# Patient Record
Sex: Female | Born: 1950 | Race: White | Hispanic: No | Marital: Married | Smoking: Former smoker
Health system: Southern US, Community
[De-identification: ages and names within clinical notes are randomized; demographics above are authoritative.]

## PROBLEM LIST (undated history)

## (undated) DIAGNOSIS — N189 Chronic kidney disease, unspecified: Secondary | ICD-10-CM

## (undated) DIAGNOSIS — F329 Major depressive disorder, single episode, unspecified: Secondary | ICD-10-CM

## (undated) DIAGNOSIS — M1990 Unspecified osteoarthritis, unspecified site: Secondary | ICD-10-CM

## (undated) DIAGNOSIS — M549 Dorsalgia, unspecified: Secondary | ICD-10-CM

## (undated) DIAGNOSIS — R002 Palpitations: Secondary | ICD-10-CM

## (undated) DIAGNOSIS — M810 Age-related osteoporosis without current pathological fracture: Secondary | ICD-10-CM

## (undated) DIAGNOSIS — I071 Rheumatic tricuspid insufficiency: Secondary | ICD-10-CM

## (undated) DIAGNOSIS — F32A Depression, unspecified: Secondary | ICD-10-CM

## (undated) DIAGNOSIS — I340 Nonrheumatic mitral (valve) insufficiency: Secondary | ICD-10-CM

## (undated) DIAGNOSIS — K5909 Other constipation: Secondary | ICD-10-CM

## (undated) DIAGNOSIS — H269 Unspecified cataract: Secondary | ICD-10-CM

## (undated) DIAGNOSIS — T7840XA Allergy, unspecified, initial encounter: Secondary | ICD-10-CM

## (undated) DIAGNOSIS — G8929 Other chronic pain: Secondary | ICD-10-CM

## (undated) DIAGNOSIS — E079 Disorder of thyroid, unspecified: Secondary | ICD-10-CM

## (undated) DIAGNOSIS — E785 Hyperlipidemia, unspecified: Secondary | ICD-10-CM

## (undated) DIAGNOSIS — I10 Essential (primary) hypertension: Secondary | ICD-10-CM

## (undated) DIAGNOSIS — F419 Anxiety disorder, unspecified: Secondary | ICD-10-CM

## (undated) DIAGNOSIS — I2089 Other forms of angina pectoris: Secondary | ICD-10-CM

## (undated) DIAGNOSIS — M797 Fibromyalgia: Secondary | ICD-10-CM

## (undated) DIAGNOSIS — IMO0002 Reserved for concepts with insufficient information to code with codable children: Secondary | ICD-10-CM

## (undated) DIAGNOSIS — K219 Gastro-esophageal reflux disease without esophagitis: Secondary | ICD-10-CM

## (undated) DIAGNOSIS — D649 Anemia, unspecified: Secondary | ICD-10-CM

## (undated) DIAGNOSIS — J449 Chronic obstructive pulmonary disease, unspecified: Secondary | ICD-10-CM

## (undated) DIAGNOSIS — I509 Heart failure, unspecified: Secondary | ICD-10-CM

## (undated) DIAGNOSIS — I208 Other forms of angina pectoris: Secondary | ICD-10-CM

## (undated) DIAGNOSIS — IMO0001 Reserved for inherently not codable concepts without codable children: Secondary | ICD-10-CM

## (undated) HISTORY — PX: TONSILLECTOMY: SUR1361

## (undated) HISTORY — DX: Palpitations: R00.2

## (undated) HISTORY — DX: Other constipation: K59.09

## (undated) HISTORY — DX: Age-related osteoporosis without current pathological fracture: M81.0

## (undated) HISTORY — DX: Gastro-esophageal reflux disease without esophagitis: K21.9

## (undated) HISTORY — DX: Depression, unspecified: F32.A

## (undated) HISTORY — DX: Unspecified osteoarthritis, unspecified site: M19.90

## (undated) HISTORY — DX: Anemia, unspecified: D64.9

## (undated) HISTORY — DX: Other forms of angina pectoris: I20.89

## (undated) HISTORY — DX: Unspecified cataract: H26.9

## (undated) HISTORY — DX: Hyperlipidemia, unspecified: E78.5

## (undated) HISTORY — DX: Disorder of thyroid, unspecified: E07.9

## (undated) HISTORY — DX: Chronic obstructive pulmonary disease, unspecified: J44.9

## (undated) HISTORY — DX: Other chronic pain: G89.29

## (undated) HISTORY — PX: SPINE SURGERY: SHX786

## (undated) HISTORY — DX: Other forms of angina pectoris: I20.8

## (undated) HISTORY — DX: Chronic kidney disease, unspecified: N18.9

## (undated) HISTORY — DX: Dorsalgia, unspecified: M54.9

## (undated) HISTORY — DX: Heart failure, unspecified: I50.9

## (undated) HISTORY — DX: Reserved for concepts with insufficient information to code with codable children: IMO0002

## (undated) HISTORY — DX: Major depressive disorder, single episode, unspecified: F32.9

## (undated) HISTORY — PX: FRACTURE SURGERY: SHX138

## (undated) HISTORY — DX: Nonrheumatic mitral (valve) insufficiency: I34.0

## (undated) HISTORY — PX: EYE SURGERY: SHX253

## (undated) HISTORY — DX: Reserved for inherently not codable concepts without codable children: IMO0001

## (undated) HISTORY — DX: Allergy, unspecified, initial encounter: T78.40XA

## (undated) HISTORY — DX: Essential (primary) hypertension: I10

## (undated) HISTORY — DX: Rheumatic tricuspid insufficiency: I07.1

## (undated) HISTORY — DX: Anxiety disorder, unspecified: F41.9

---

## 1968-08-19 HISTORY — PX: BREAST LUMPECTOMY: SHX2

## 1971-08-20 HISTORY — PX: TUBAL LIGATION: SHX77

## 2000-12-30 ENCOUNTER — Encounter: Payer: Self-pay | Admitting: *Deleted

## 2000-12-30 ENCOUNTER — Ambulatory Visit (HOSPITAL_COMMUNITY): Admission: RE | Admit: 2000-12-30 | Discharge: 2000-12-30 | Payer: Self-pay | Admitting: *Deleted

## 2001-01-26 ENCOUNTER — Other Ambulatory Visit: Admission: RE | Admit: 2001-01-26 | Discharge: 2001-01-26 | Payer: Self-pay | Admitting: *Deleted

## 2002-01-19 ENCOUNTER — Encounter: Payer: Self-pay | Admitting: *Deleted

## 2002-01-19 ENCOUNTER — Ambulatory Visit (HOSPITAL_COMMUNITY): Admission: RE | Admit: 2002-01-19 | Discharge: 2002-01-19 | Payer: Self-pay | Admitting: *Deleted

## 2003-01-26 ENCOUNTER — Encounter: Payer: Self-pay | Admitting: Family Medicine

## 2003-01-26 ENCOUNTER — Ambulatory Visit (HOSPITAL_COMMUNITY): Admission: RE | Admit: 2003-01-26 | Discharge: 2003-01-26 | Payer: Self-pay | Admitting: Family Medicine

## 2003-01-27 ENCOUNTER — Other Ambulatory Visit: Admission: RE | Admit: 2003-01-27 | Discharge: 2003-01-27 | Payer: Self-pay | Admitting: *Deleted

## 2003-07-20 ENCOUNTER — Ambulatory Visit (HOSPITAL_COMMUNITY): Admission: RE | Admit: 2003-07-20 | Discharge: 2003-07-20 | Payer: Self-pay | Admitting: Family Medicine

## 2003-08-02 ENCOUNTER — Encounter (HOSPITAL_COMMUNITY): Admission: RE | Admit: 2003-08-02 | Discharge: 2003-09-01 | Payer: Self-pay | Admitting: Family Medicine

## 2003-08-20 HISTORY — PX: BACK SURGERY: SHX140

## 2003-09-20 ENCOUNTER — Ambulatory Visit (HOSPITAL_COMMUNITY): Admission: RE | Admit: 2003-09-20 | Discharge: 2003-09-20 | Payer: Self-pay | Admitting: Internal Medicine

## 2003-12-12 ENCOUNTER — Ambulatory Visit (HOSPITAL_COMMUNITY): Admission: RE | Admit: 2003-12-12 | Discharge: 2003-12-12 | Payer: Self-pay | Admitting: *Deleted

## 2004-02-28 ENCOUNTER — Encounter (INDEPENDENT_AMBULATORY_CARE_PROVIDER_SITE_OTHER): Payer: Self-pay | Admitting: Specialist

## 2004-02-28 ENCOUNTER — Inpatient Hospital Stay (HOSPITAL_COMMUNITY): Admission: RE | Admit: 2004-02-28 | Discharge: 2004-03-04 | Payer: Self-pay | Admitting: Specialist

## 2005-01-21 ENCOUNTER — Ambulatory Visit (HOSPITAL_COMMUNITY): Admission: RE | Admit: 2005-01-21 | Discharge: 2005-01-21 | Payer: Self-pay | Admitting: *Deleted

## 2005-03-07 ENCOUNTER — Ambulatory Visit (HOSPITAL_COMMUNITY): Admission: RE | Admit: 2005-03-07 | Discharge: 2005-03-07 | Payer: Self-pay | Admitting: Family Medicine

## 2006-04-22 ENCOUNTER — Ambulatory Visit (HOSPITAL_COMMUNITY): Admission: RE | Admit: 2006-04-22 | Discharge: 2006-04-22 | Payer: Self-pay | Admitting: Family Medicine

## 2006-10-17 ENCOUNTER — Ambulatory Visit (HOSPITAL_COMMUNITY): Admission: RE | Admit: 2006-10-17 | Discharge: 2006-10-17 | Payer: Self-pay | Admitting: Family Medicine

## 2007-02-05 ENCOUNTER — Encounter
Admission: RE | Admit: 2007-02-05 | Discharge: 2007-05-06 | Payer: Self-pay | Admitting: Physical Medicine & Rehabilitation

## 2007-02-10 ENCOUNTER — Ambulatory Visit (HOSPITAL_COMMUNITY)
Admission: RE | Admit: 2007-02-10 | Discharge: 2007-02-10 | Payer: Self-pay | Admitting: Physical Medicine & Rehabilitation

## 2007-02-10 ENCOUNTER — Ambulatory Visit: Payer: Self-pay | Admitting: Physical Medicine & Rehabilitation

## 2007-02-13 ENCOUNTER — Ambulatory Visit (HOSPITAL_COMMUNITY): Admission: RE | Admit: 2007-02-13 | Discharge: 2007-02-13 | Payer: Self-pay | Admitting: Nephrology

## 2007-04-07 ENCOUNTER — Ambulatory Visit: Payer: Self-pay | Admitting: Physical Medicine & Rehabilitation

## 2007-05-06 ENCOUNTER — Encounter
Admission: RE | Admit: 2007-05-06 | Discharge: 2007-08-04 | Payer: Self-pay | Admitting: Physical Medicine & Rehabilitation

## 2007-05-22 ENCOUNTER — Ambulatory Visit (HOSPITAL_COMMUNITY): Admission: RE | Admit: 2007-05-22 | Discharge: 2007-05-22 | Payer: Self-pay | Admitting: Internal Medicine

## 2007-06-02 ENCOUNTER — Other Ambulatory Visit: Admission: RE | Admit: 2007-06-02 | Discharge: 2007-06-02 | Payer: Self-pay | Admitting: Obstetrics and Gynecology

## 2007-06-17 ENCOUNTER — Ambulatory Visit: Payer: Self-pay | Admitting: Physical Medicine & Rehabilitation

## 2007-08-07 ENCOUNTER — Encounter
Admission: RE | Admit: 2007-08-07 | Discharge: 2007-11-05 | Payer: Self-pay | Admitting: Physical Medicine & Rehabilitation

## 2007-09-24 ENCOUNTER — Ambulatory Visit: Payer: Self-pay | Admitting: Physical Medicine & Rehabilitation

## 2007-09-30 ENCOUNTER — Encounter
Admission: RE | Admit: 2007-09-30 | Discharge: 2007-10-02 | Payer: Self-pay | Admitting: Physical Medicine & Rehabilitation

## 2007-10-29 ENCOUNTER — Ambulatory Visit: Payer: Self-pay | Admitting: Physical Medicine & Rehabilitation

## 2008-01-01 ENCOUNTER — Encounter
Admission: RE | Admit: 2008-01-01 | Discharge: 2008-03-31 | Payer: Self-pay | Admitting: Physical Medicine & Rehabilitation

## 2008-01-05 ENCOUNTER — Ambulatory Visit: Payer: Self-pay | Admitting: Physical Medicine & Rehabilitation

## 2008-02-04 ENCOUNTER — Ambulatory Visit: Payer: Self-pay | Admitting: Physical Medicine & Rehabilitation

## 2008-03-03 ENCOUNTER — Ambulatory Visit: Payer: Self-pay | Admitting: Physical Medicine & Rehabilitation

## 2008-04-27 ENCOUNTER — Encounter
Admission: RE | Admit: 2008-04-27 | Discharge: 2008-04-28 | Payer: Self-pay | Admitting: Physical Medicine & Rehabilitation

## 2008-04-28 ENCOUNTER — Ambulatory Visit: Payer: Self-pay | Admitting: Physical Medicine & Rehabilitation

## 2008-06-01 ENCOUNTER — Ambulatory Visit (HOSPITAL_COMMUNITY): Admission: RE | Admit: 2008-06-01 | Discharge: 2008-06-01 | Payer: Self-pay | Admitting: Internal Medicine

## 2008-07-26 ENCOUNTER — Other Ambulatory Visit: Admission: RE | Admit: 2008-07-26 | Discharge: 2008-07-26 | Payer: Self-pay | Admitting: Obstetrics and Gynecology

## 2008-09-07 ENCOUNTER — Encounter
Admission: RE | Admit: 2008-09-07 | Discharge: 2008-10-19 | Payer: Self-pay | Admitting: Physical Medicine & Rehabilitation

## 2008-09-08 ENCOUNTER — Ambulatory Visit: Payer: Self-pay | Admitting: Physical Medicine & Rehabilitation

## 2008-10-13 ENCOUNTER — Ambulatory Visit: Payer: Self-pay | Admitting: Physical Medicine & Rehabilitation

## 2008-12-19 HISTORY — PX: US ECHOCARDIOGRAPHY: HXRAD669

## 2009-02-16 HISTORY — PX: NM MYOCAR PERF WALL MOTION: HXRAD629

## 2009-09-26 ENCOUNTER — Encounter: Payer: Self-pay | Admitting: Physician Assistant

## 2009-12-11 ENCOUNTER — Ambulatory Visit: Payer: Self-pay | Admitting: Family Medicine

## 2009-12-11 DIAGNOSIS — F32A Depression, unspecified: Secondary | ICD-10-CM | POA: Insufficient documentation

## 2009-12-11 DIAGNOSIS — E785 Hyperlipidemia, unspecified: Secondary | ICD-10-CM | POA: Insufficient documentation

## 2009-12-11 DIAGNOSIS — M5440 Lumbago with sciatica, unspecified side: Secondary | ICD-10-CM | POA: Insufficient documentation

## 2009-12-11 DIAGNOSIS — N183 Chronic kidney disease, stage 3 unspecified: Secondary | ICD-10-CM | POA: Insufficient documentation

## 2009-12-11 DIAGNOSIS — F329 Major depressive disorder, single episode, unspecified: Secondary | ICD-10-CM

## 2009-12-11 DIAGNOSIS — F419 Anxiety disorder, unspecified: Secondary | ICD-10-CM | POA: Insufficient documentation

## 2009-12-11 DIAGNOSIS — I10 Essential (primary) hypertension: Secondary | ICD-10-CM | POA: Insufficient documentation

## 2009-12-11 DIAGNOSIS — F411 Generalized anxiety disorder: Secondary | ICD-10-CM | POA: Insufficient documentation

## 2009-12-11 DIAGNOSIS — J309 Allergic rhinitis, unspecified: Secondary | ICD-10-CM | POA: Insufficient documentation

## 2009-12-11 DIAGNOSIS — K219 Gastro-esophageal reflux disease without esophagitis: Secondary | ICD-10-CM | POA: Insufficient documentation

## 2009-12-14 ENCOUNTER — Encounter: Payer: Self-pay | Admitting: Physician Assistant

## 2009-12-16 ENCOUNTER — Encounter: Payer: Self-pay | Admitting: Physician Assistant

## 2010-02-14 ENCOUNTER — Encounter: Payer: Self-pay | Admitting: Physician Assistant

## 2010-05-02 ENCOUNTER — Ambulatory Visit: Payer: Self-pay | Admitting: Family Medicine

## 2010-05-02 DIAGNOSIS — R109 Unspecified abdominal pain: Secondary | ICD-10-CM | POA: Insufficient documentation

## 2010-05-02 DIAGNOSIS — K5909 Other constipation: Secondary | ICD-10-CM | POA: Insufficient documentation

## 2010-05-02 DIAGNOSIS — E559 Vitamin D deficiency, unspecified: Secondary | ICD-10-CM | POA: Insufficient documentation

## 2010-05-02 LAB — CONVERTED CEMR LAB
Bilirubin Urine: NEGATIVE
Protein, U semiquant: NEGATIVE
Specific Gravity, Urine: 1.01
WBC Urine, dipstick: NEGATIVE
pH: 5

## 2010-06-07 ENCOUNTER — Encounter: Payer: Self-pay | Admitting: Family Medicine

## 2010-06-19 ENCOUNTER — Ambulatory Visit (HOSPITAL_COMMUNITY): Admission: RE | Admit: 2010-06-19 | Discharge: 2010-06-19 | Payer: Self-pay | Admitting: Family Medicine

## 2010-06-20 ENCOUNTER — Encounter: Payer: Self-pay | Admitting: Physician Assistant

## 2010-06-28 ENCOUNTER — Telehealth (INDEPENDENT_AMBULATORY_CARE_PROVIDER_SITE_OTHER): Payer: Self-pay | Admitting: *Deleted

## 2010-07-11 ENCOUNTER — Encounter: Payer: Self-pay | Admitting: Family Medicine

## 2010-07-26 ENCOUNTER — Encounter: Payer: Self-pay | Admitting: Family Medicine

## 2010-07-26 LAB — CONVERTED CEMR LAB
ALT: 9 U/L
AST: 24 U/L
Albumin: 4.5 g/dL
Alkaline Phosphatase: 37 U/L — ABNORMAL LOW
BUN: 27 mg/dL — ABNORMAL HIGH
CO2: 28 meq/L
Calcium: 9.9 mg/dL
Chloride: 104 meq/L
Cholesterol: 150 mg/dL
Creatinine, Ser: 1.82 mg/dL — ABNORMAL HIGH
Glucose, Bld: 101 mg/dL — ABNORMAL HIGH
HCT: 36.8 %
HDL: 53 mg/dL
Hemoglobin: 12.2 g/dL
LDL Cholesterol: 66 mg/dL
MCHC: 33.2 g/dL
MCV: 93.6 fL
Platelets: 335 K/uL
Potassium: 4.2 meq/L
RBC: 3.93 M/uL
RDW: 14.3 %
Sodium: 141 meq/L
TSH: 8.955 u[IU]/mL — ABNORMAL HIGH
Total Bilirubin: 0.5 mg/dL
Total CHOL/HDL Ratio: 2.8
Total Protein: 6.7 g/dL
Triglycerides: 155 mg/dL — ABNORMAL HIGH
VLDL: 31 mg/dL
Vit D, 25-Hydroxy: 43 ng/mL
WBC: 7.9 10*3/microliter

## 2010-07-31 ENCOUNTER — Encounter: Payer: Self-pay | Admitting: Family Medicine

## 2010-08-01 LAB — CONVERTED CEMR LAB
Free T4: 0.96 ng/dL (ref 0.80–1.80)
T3, Free: 2.2 pg/mL — ABNORMAL LOW (ref 2.3–4.2)

## 2010-08-07 ENCOUNTER — Ambulatory Visit: Payer: Self-pay | Admitting: Family Medicine

## 2010-08-07 DIAGNOSIS — E049 Nontoxic goiter, unspecified: Secondary | ICD-10-CM | POA: Insufficient documentation

## 2010-08-07 DIAGNOSIS — E039 Hypothyroidism, unspecified: Secondary | ICD-10-CM | POA: Insufficient documentation

## 2010-08-07 LAB — CONVERTED CEMR LAB
Hgb A1c MFr Bld: 5.2 % (ref <5.7)
Iron: 75 ug/dL (ref 42–145)

## 2010-08-10 ENCOUNTER — Ambulatory Visit (HOSPITAL_COMMUNITY)
Admission: RE | Admit: 2010-08-10 | Discharge: 2010-08-10 | Payer: Self-pay | Source: Home / Self Care | Attending: Family Medicine | Admitting: Family Medicine

## 2010-08-15 ENCOUNTER — Encounter: Payer: Self-pay | Admitting: Family Medicine

## 2010-08-27 ENCOUNTER — Ambulatory Visit
Admission: RE | Admit: 2010-08-27 | Discharge: 2010-08-27 | Payer: Self-pay | Source: Home / Self Care | Attending: Endocrinology | Admitting: Endocrinology

## 2010-08-31 ENCOUNTER — Other Ambulatory Visit
Admission: RE | Admit: 2010-08-31 | Discharge: 2010-08-31 | Payer: Self-pay | Source: Home / Self Care | Admitting: Obstetrics and Gynecology

## 2010-09-02 ENCOUNTER — Telehealth: Payer: Self-pay | Admitting: Family Medicine

## 2010-09-09 ENCOUNTER — Encounter: Payer: Self-pay | Admitting: Specialist

## 2010-09-09 ENCOUNTER — Encounter: Payer: Self-pay | Admitting: Family Medicine

## 2010-09-10 ENCOUNTER — Encounter: Payer: Self-pay | Admitting: Internal Medicine

## 2010-09-12 ENCOUNTER — Encounter: Payer: Self-pay | Admitting: Family Medicine

## 2010-09-18 NOTE — Assessment & Plan Note (Signed)
Summary: NEW PATIENT- room 1   Vital Signs:  Patient profile:   60 year old female Height:      65 inches Weight:      189.75 pounds BMI:     31.69 O2 Sat:      96 % on Room air Pulse rate:   74 / minute Resp:     16 per minute BP sitting:   124 / 82  (left arm)  Vitals Entered By: Baldomero Lamy LPN (April 25, 624THL [id number] PM) CC: new patient Is Patient Diabetic? No Pain Assessment Patient in pain? yes     Location: back Intensity: 5 Type: sharp Onset of pain  Chronic   CC:  new patient.  History of Present Illness: New pt here to establish care with new PCP. Prev pt of Dr Nevada Crane.  Wants to establish care with a female provider.  Hx of chronic back pain.  Had surgery in 2005, has hardware in her back.  Has another pinched nerve but neurosurg doesnt want to do surgery again.  Had been on Duragesic patch, but having problems getting the name brand patches.  Dr Nevada Crane put her on Morphine in the meantime, but wears off at night & makes her nauseated.  Also has hydrocodone for break thru pain. Has seen Dr Lucillie Garfinkel in the past for injections. Has radiation to LE. States it varies which LE.    Hx of low iron & low vit D.  Pt states she is due to have these checked again.  States her cholesterol labs are UTD though. Last colonoscopy < 10 yrs.  Hx of htn. Taking medications as prescribed. No headache, chest pain or palpitations. Hx of Hyperlipidemia.  Taking medications as prescribed. Not exercising regularly. Hx of GERD.  States is well controlled on meds.   GYN - Dr Glo Herring GI - Dr Daisey Must Cardiologist - Dr Claiborne Billings Nephrology - Dr Lowanda Foster Neuro surg - Dr Louanne Skye   Current Medications (verified): 1)  Toprol Xl 100 Mg Xr24h-Tab (Metoprolol Succinate) .... One Tab By Mouth Once Daily 2)  Lipitor 40 Mg Tabs (Atorvastatin Calcium) .... One Tab By Mouth Once Daily 3)  Torsemide 20 Mg Tabs (Torsemide) .... One To Two Tabs By Mouth Once Daily 4)  Aspir-Low 81 Mg Tbec (Aspirin) .... One  Tab By Mouth Once Daily 5)  Trilipix 135 Mg Cpdr (Choline Fenofibrate) .... One Tab By Mouth Once Daily 6)  Evista 60 Mg Tabs (Raloxifene Hcl) .... One Tab By Mouth Once Daily 7)  Calcium-Vitamin D 600-125 Mg-Unit Tabs (Calcium-Vitamin D) .... One To Two Tabs By Mouth Once Daily 8)  Protonix 40 Mg Tbec (Pantoprazole Sodium) .... One To Two Tabs By Mouth Once Daily 9)  Flexeril 5 Mg Tabs (Cyclobenzaprine Hcl) .... One Tab By Mouth Three Times A Day As Needed 10)  Lorazepam 1 Mg Tabs (Lorazepam) .... One Tab Po Daily As Needed and One Tab By Mouth At Bedtime 11)  Cymbalta 60 Mg Cpep (Duloxetine Hcl) .... One Cap By Mouth Once Daily 12)  Vitamin D 1000 Unit Tabs (Cholecalciferol) .... Two Tabs By Mouth Once Daily 13)  Morphine Sulfate Cr 60 Mg Xr12h-Tab (Morphine Sulfate) .... One Tab By Mouth Three Times A Day 14)  Hydrocodone-Acetaminophen 7.5-325 Mg Tabs (Hydrocodone-Acetaminophen) .... One Tab By Mouth Two Times A Day As Needed For Break Through Pain 15)  Senokot S 8.6-50 Mg Tabs (Sennosides-Docusate Sodium) .... One Tab By Mouth At Bedtime 16)  Stool Softener 100 Mg Caps (Docusate Sodium) .Marland KitchenMarland KitchenMarland Kitchen  One Cap By Mouth Two Times A Day 17)  Qc Daily Multivitamins/iron  Tabs (Multiple Vitamins-Iron) .... One Tab By Mouth Once Daily 18)  Miralax  Powd (Polyethylene Glycol 3350) .Marland Kitchen.. 17grams With Water By Mouth At Bedtime As Needed 19)  Lovaza 1 Gm Caps (Omega-3-Acid Ethyl Esters) .... Two Caps By Mouth Two Times A Day With Meals 20)  Fexofenadine Hcl 180 Mg Tabs (Fexofenadine Hcl) .... One Tab By Mouth Once Daily 21)  Fluticasone Propionate 50 Mcg/act Susp (Fluticasone Propionate) .... Two Puffs Each Nostril Daily  Allergies (verified): 1)  ! Duragesic-100 (Fentanyl)  Past History:  Past medical, surgical, family and social histories (including risk factors) reviewed for relevance to current acute and chronic problems.  Past Medical History: Allergic  rhinitis Anxiety Depression GERD Hypertension Chronic back pain Chronic constipation Palptations CKD Angina  GYN - Dr Glo Herring GI - Dr Daisey Must Cardiologist - Dr Claiborne Billings Nephrology - Dr Lowanda Foster Neuro surg - Dr Louanne Skye  Past Surgical History: Lumpectomy right breast 1970 Tubal ligation (1973) Back surgery 2005 Tonsillectomy childhood  Family History: Reviewed history and no changes required. mother deceased- osteoporosis, rheumatoid arthritis father deceased- heart disease, DM, HTN, hyperlipidemia, bladder cancer two brothers living- arthritis, heart mumor  Social History: Reviewed history and no changes required. Disabled Married 42 years Two grown children Quit smoking 2005 Alcohol use-no Drug use-no Regular exercise-no Drug Use:  no Does Patient Exercise:  no  Review of Systems CV:  Complains of chest pain or discomfort and palpitations; SEES CARDIOLOGIST. Resp:  Complains of shortness of breath; denies cough and wheezing; WITH ANXIETY & EXERTIONAL. GI:  Complains of constipation. MS:  Complains of low back pain. Neuro:  Complains of numbness and tingling. Psych:  Complains of anxiety, depression, and easily tearful.  Physical Exam  General:  Well-developed,well-nourished,in no acute distress; alert,appropriate and cooperative throughout examination Head:  Normocephalic and atraumatic without obvious abnormalities. No apparent alopecia or balding. Ears:  External ear exam shows no significant lesions or deformities.  Otoscopic examination reveals clear canals, tympanic membranes are intact bilaterally without bulging, retraction, inflammation or discharge. Hearing is grossly normal bilaterally. Nose:  External nasal examination shows no deformity or inflammation. Nasal mucosa are pink and moist without lesions or exudates. Mouth:  Oral mucosa and oropharynx without lesions or exudates.   Neck:  no thyromegaly.   Lungs:  Normal respiratory effort, chest expands  symmetrically. Lungs are clear to auscultation, no crackles or wheezes. Heart:  Normal rate and regular rhythm. S1 and S2 normal without gallop, murmur, click, rub or other extra sounds. Abdomen:  Bowel sounds positive,abdomen soft and non-tender without masses, organomegaly or hernias noted. Cervical Nodes:  No lymphadenopathy noted Psych:  Cognition and judgment appear intact. Alert and cooperative with normal attention span and concentration. No apparent delusions, illusions, hallucinations   Impression & Recommendations:  Problem # 1:  LOW BACK PAIN, CHRONIC (ICD-724.2) Assessment Deteriorated Discussed with pt that I will not manage her pain medications.  She will need to return to the pain clinic for this. I will not prescribe or refill any of these medications now or in the future.  Her updated medication list for this problem includes:    Aspir-low 81 Mg Tbec (Aspirin) ..... One tab by mouth once daily    Flexeril 5 Mg Tabs (Cyclobenzaprine hcl) ..... One tab by mouth three times a day as needed    Morphine Sulfate Cr 60 Mg Xr12h-tab (Morphine sulfate) ..... One tab by mouth three times a day  Hydrocodone-acetaminophen 7.5-325 Mg Tabs (Hydrocodone-acetaminophen) ..... One tab by mouth two times a day as needed for break through pain  Orders: Misc. Referral (Misc. Ref)  Problem # 2:  HYPERTENSION (ICD-401.9) Assessment: Comment Only  Her updated medication list for this problem includes:    Toprol Xl 100 Mg Xr24h-tab (Metoprolol succinate) ..... One tab by mouth once daily    Torsemide 20 Mg Tabs (Torsemide) ..... One to two tabs by mouth once daily  BP today: 124/82  Problem # 3:  RENAL INSUFFICIENCY, CHRONIC (ICD-585.9) Assessment: Comment Only Pt to continue follow up care with nephrologist.  Problem # 4:  GERD (ICD-530.81) Assessment: Comment Only  Her updated medication list for this problem includes:    Protonix 40 Mg Tbec (Pantoprazole sodium) ..... One to two  tabs by mouth once daily  Problem # 5:  HYPERLIPIDEMIA (ICD-272.4) Assessment: Comment Only  Her updated medication list for this problem includes:    Lipitor 40 Mg Tabs (Atorvastatin calcium) ..... One tab by mouth once daily    Trilipix 135 Mg Cpdr (Choline fenofibrate) ..... One tab by mouth once daily    Lovaza 1 Gm Caps (Omega-3-acid ethyl esters) .Marland Kitchen..Marland Kitchen Two caps by mouth two times a day with meals  Complete Medication List: 1)  Toprol Xl 100 Mg Xr24h-tab (Metoprolol succinate) .... One tab by mouth once daily 2)  Lipitor 40 Mg Tabs (Atorvastatin calcium) .... One tab by mouth once daily 3)  Torsemide 20 Mg Tabs (Torsemide) .... One to two tabs by mouth once daily 4)  Aspir-low 81 Mg Tbec (Aspirin) .... One tab by mouth once daily 5)  Trilipix 135 Mg Cpdr (Choline fenofibrate) .... One tab by mouth once daily 6)  Evista 60 Mg Tabs (Raloxifene hcl) .... One tab by mouth once daily 7)  Calcium-vitamin D 600-125 Mg-unit Tabs (Calcium-vitamin d) .... One to two tabs by mouth once daily 8)  Protonix 40 Mg Tbec (Pantoprazole sodium) .... One to two tabs by mouth once daily 9)  Flexeril 5 Mg Tabs (Cyclobenzaprine hcl) .... One tab by mouth three times a day as needed 10)  Lorazepam 1 Mg Tabs (Lorazepam) .... One tab po daily as needed and one tab by mouth at bedtime 11)  Cymbalta 60 Mg Cpep (Duloxetine hcl) .... One cap by mouth once daily 12)  Vitamin D 1000 Unit Tabs (Cholecalciferol) .... Two tabs by mouth once daily 13)  Morphine Sulfate Cr 60 Mg Xr12h-tab (Morphine sulfate) .... One tab by mouth three times a day 14)  Hydrocodone-acetaminophen 7.5-325 Mg Tabs (Hydrocodone-acetaminophen) .... One tab by mouth two times a day as needed for break through pain 15)  Senokot S 8.6-50 Mg Tabs (Sennosides-docusate sodium) .... One tab by mouth at bedtime 16)  Stool Softener 100 Mg Caps (Docusate sodium) .... One cap by mouth two times a day 17)  Qc Daily Multivitamins/iron Tabs (Multiple  vitamins-iron) .... One tab by mouth once daily 18)  Miralax Powd (Polyethylene glycol 3350) .Marland Kitchen.. 17grams with water by mouth at bedtime as needed 19)  Lovaza 1 Gm Caps (Omega-3-acid ethyl esters) .... Two caps by mouth two times a day with meals 20)  Fexofenadine Hcl 180 Mg Tabs (Fexofenadine hcl) .... One tab by mouth once daily 21)  Fluticasone Propionate 50 Mcg/act Susp (Fluticasone propionate) .... Two puffs each nostril daily  Patient Instructions: 1)  Please schedule a follow-up appointment in 3 months. 2)  We will refer you back to Dr Nelva Bush for your back pain management.  3)  We will send your record request to Dr Nevada Crane.

## 2010-09-18 NOTE — Letter (Signed)
Summary: Letter to [facility] appt.  Letter to [facility] appt.   Imported By: Eliezer Mccoy 06/08/2010 15:18:38  _____________________________________________________________________  External Attachment:    Type:   Image     Comment:   External Document

## 2010-09-18 NOTE — Progress Notes (Signed)
Summary: [facility]   Imported By: Dierdre Harness 03/01/2010 16:24:01  _____________________________________________________________________  External Attachment:    Type:   Image     Comment:   External Document

## 2010-09-18 NOTE — Progress Notes (Signed)
Summary: [facility]  [facility]   Imported By: Dierdre Harness 12/28/2009 10:29:59  _____________________________________________________________________  External Attachment:    Type:   Image     Comment:   External Document

## 2010-09-18 NOTE — Assessment & Plan Note (Signed)
Summary: follow up - room 3   Vital Signs:  Patient profile:   60 year old female Height:      65 inches Weight:      186.75 pounds BMI:     31.19 O2 Sat:      96 % on Room air Pulse rate:   86 / minute Resp:     16 per minute BP sitting:   140 / 70  (left arm)  Vitals Entered By: Baldomero Lamy LPN (September 14, 624THL 4:10 PM) CC: follow-up visit Is Patient Diabetic? No   CC:  follow-up visit.  History of Present Illness: Pt presents today for check up.  Having lower abd pain x -2  mos, intermittently.  Notices with pushing on the area and change of positions. Hx of chronic constipation.  Uses stool softeners daily.  Has been going regularly. sometimes has this abd pain with BMs.  No blood.  She has scheduled an appt with Dr Glo Herring for her exam.  Hx of htn. Taking medications as prescribed and denies side effects.  No headache, chest pain.  Has palpitations only with anxiety.  Hx of Hyperlipidemia.  Taking medications as prescribed and no side effects.  Following a low fat diet. Not exercising regularly.  Hx of anxiety.  Increased family stress.  Difficulty falling asleep, stays asleep OK.  No SI or HI. Hx of allergies.  Allegra not working well.  Requests prescription allergy med.  Also is using a steroid nasal spray daily.        Current Medications (verified): 1)  Toprol Xl 100 Mg Xr24h-Tab (Metoprolol Succinate) .... One Tab By Mouth Once Daily 2)  Lipitor 40 Mg Tabs (Atorvastatin Calcium) .... One Tab By Mouth Once Daily 3)  Torsemide 20 Mg Tabs (Torsemide) .... One To Two Tabs By Mouth Once Daily 4)  Aspir-Low 81 Mg Tbec (Aspirin) .... One Tab By Mouth Once Daily 5)  Trilipix 135 Mg Cpdr (Choline Fenofibrate) .... One Tab By Mouth Once Daily 6)  Evista 60 Mg Tabs (Raloxifene Hcl) .... One Tab By Mouth Once Daily 7)  Calcium-Vitamin D 600-125 Mg-Unit Tabs (Calcium-Vitamin D) .... One To Two Tabs By Mouth Once Daily 8)  Protonix 40 Mg Tbec (Pantoprazole Sodium)  .... One To Two Tabs By Mouth Once Daily 9)  Flexeril 5 Mg Tabs (Cyclobenzaprine Hcl) .... One Tab By Mouth Three Times A Day As Needed 10)  Lorazepam 1 Mg Tabs (Lorazepam) .... One Tab Po Daily As Needed and One Tab By Mouth At Bedtime 11)  Cymbalta 60 Mg Cpep (Duloxetine Hcl) .... One Cap By Mouth Once Daily 12)  Vitamin D 1000 Unit Tabs (Cholecalciferol) .... Two Tabs By Mouth Once Daily 13)  Morphine Sulfate Cr 60 Mg Xr12h-Tab (Morphine Sulfate) .... One Tab By Mouth Three Times A Day 14)  Hydrocodone-Acetaminophen 7.5-325 Mg Tabs (Hydrocodone-Acetaminophen) .... One Tab By Mouth Two Times A Day As Needed For Break Through Pain 15)  Senokot S 8.6-50 Mg Tabs (Sennosides-Docusate Sodium) .... One Tab By Mouth At Bedtime 16)  Stool Softener 100 Mg Caps (Docusate Sodium) .... One Cap By Mouth Two Times A Day 17)  Qc Daily Multivitamins/iron  Tabs (Multiple Vitamins-Iron) .... One Tab By Mouth Once Daily 18)  Miralax  Powd (Polyethylene Glycol 3350) .Marland Kitchen.. 17grams With Water By Mouth At Bedtime As Needed 19)  Lovaza 1 Gm Caps (Omega-3-Acid Ethyl Esters) .... Two Caps By Mouth Two Times A Day With Meals 20)  Fexofenadine Hcl 180 Mg  Tabs (Fexofenadine Hcl) .... One Tab By Mouth Once Daily 21)  Fluticasone Propionate 50 Mcg/act Susp (Fluticasone Propionate) .... Two Puffs Each Nostril Daily  Allergies (verified): 1)  ! Duragesic-100 (Fentanyl)  Past History:  Past medical history reviewed for relevance to current acute and chronic problems.  Past Medical History: Reviewed history from 12/11/2009 and no changes required. Allergic rhinitis Anxiety Depression GERD Hypertension Chronic back pain Chronic constipation Palptations CKD Angina  GYN - Dr Glo Herring GI - Dr Daisey Must Cardiologist - Dr Claiborne Billings Nephrology - Dr Lowanda Foster Neuro surg - Dr Louanne Skye  Review of Systems General:  Denies chills and fever. ENT:  Complains of nasal congestion; denies ear discharge, postnasal drainage, sinus  pressure, and sore throat. CV:  Denies chest pain or discomfort and palpitations. Resp:  Denies shortness of breath. GI:  Complains of abdominal pain and constipation; denies bloody stools, change in bowel habits, nausea, and vomiting. GU:  Denies dysuria, incontinence, and urinary frequency.  Physical Exam  General:  Well-developed,well-nourished,in no acute distress; alert,appropriate and cooperative throughout examination Head:  Normocephalic and atraumatic without obvious abnormalities. No apparent alopecia or balding. Ears:  External ear exam shows no significant lesions or deformities.  Otoscopic examination reveals clear canals, tympanic membranes are intact bilaterally without bulging, retraction, inflammation or discharge. Hearing is grossly normal bilaterally. Nose:  External nasal examination shows no deformity or inflammation. Nasal mucosa are pink and moist without lesions or exudates. Mouth:  Oral mucosa and oropharynx without lesions or exudates.  Teeth in good repair. Neck:  No deformities, masses, or tenderness noted. Lungs:  Normal respiratory effort, chest expands symmetrically. Lungs are clear to auscultation, no crackles or wheezes. Heart:  Normal rate and regular rhythm. S1 and S2 normal without gallop, murmur, click, rub or other extra sounds. Abdomen:  soft, normal bowel sounds, no masses, no hepatomegaly, and no splenomegaly.  Mild suprapubic TTP midline. Cervical Nodes:  No lymphadenopathy noted Psych:  Cognition and judgment appear intact. Alert and cooperative with normal attention span and concentration. No apparent delusions, illusions, hallucinations   Impression & Recommendations:  Problem # 1:  ABDOMINAL PAIN (ICD-789.00) Assessment New Discussed with pt may be due to her chronic constipation, &/or her LBP.  Orders: KUB (KUB) Urinalysis [id number])  Problem # 2:  HYPERTENSION (ICD-401.9) Assessment: Comment Only  Her updated medication list for  this problem includes:    Toprol Xl 100 Mg Xr24h-tab (Metoprolol succinate) ..... One tab by mouth once daily    Torsemide 20 Mg Tabs (Torsemide) ..... One to two tabs by mouth once daily  Orders: T-Comprehensive Metabolic Panel ([id number])  BP today: 140/70 Prior BP: 124/82 (12/11/2009)  Problem # 3:  HYPERLIPIDEMIA (ICD-272.4) Assessment: Comment Only  Her updated medication list for this problem includes:    Lipitor 40 Mg Tabs (Atorvastatin calcium) ..... One tab by mouth once daily    Trilipix 135 Mg Cpdr (Choline fenofibrate) ..... One tab by mouth once daily    Lovaza 1 Gm Caps (Omega-3-acid ethyl esters) .Marland Kitchen..Marland Kitchen Two caps by mouth two times a day with meals  Orders: T-Comprehensive Metabolic Panel ([id number]) T-Lipid Profile [id number])  Problem # 4:  ALLERGIC RHINITIS (ICD-477.9) Assessment: Deteriorated  Her updated medication list for this problem includes:    Xyzal 5 Mg Tabs (Levocetirizine dihydrochloride) .Marland Kitchen... Take 1 daily    Fluticasone Propionate 50 Mcg/act Susp (Fluticasone propionate) .Marland Kitchen..Marland Kitchen Two puffs each nostril daily  Discussed use of allergy medications and environmental measures.   Complete Medication List: 1)  Toprol Xl  100 Mg Xr24h-tab (Metoprolol succinate) .... One tab by mouth once daily 2)  Lipitor 40 Mg Tabs (Atorvastatin calcium) .... One tab by mouth once daily 3)  Torsemide 20 Mg Tabs (Torsemide) .... One to two tabs by mouth once daily 4)  Aspir-low 81 Mg Tbec (Aspirin) .... One tab by mouth once daily 5)  Trilipix 135 Mg Cpdr (Choline fenofibrate) .... One tab by mouth once daily 6)  Evista 60 Mg Tabs (Raloxifene hcl) .... One tab by mouth once daily 7)  Calcium-vitamin D 600-125 Mg-unit Tabs (Calcium-vitamin d) .... One to two tabs by mouth once daily 8)  Protonix 40 Mg Tbec (Pantoprazole sodium) .... One to two tabs by mouth once daily 9)  Flexeril 5 Mg Tabs (Cyclobenzaprine hcl) .... One tab by mouth three times a day as needed 10)   Lorazepam 1 Mg Tabs (Lorazepam) .... One tab po daily as needed and one tab by mouth at bedtime 11)  Cymbalta 60 Mg Cpep (Duloxetine hcl) .... One cap by mouth once daily 12)  Vitamin D 1000 Unit Tabs (Cholecalciferol) .... Two tabs by mouth once daily 13)  Morphine Sulfate Cr 60 Mg Xr12h-tab (Morphine sulfate) .... One tab by mouth three times a day 14)  Hydrocodone-acetaminophen 7.5-325 Mg Tabs (Hydrocodone-acetaminophen) .... One tab by mouth two times a day as needed for break through pain 15)  Senokot S 8.6-50 Mg Tabs (Sennosides-docusate sodium) .... One tab by mouth at bedtime 16)  Stool Softener 100 Mg Caps (Docusate sodium) .... One cap by mouth two times a day 17)  Qc Daily Multivitamins/iron Tabs (Multiple vitamins-iron) .... One tab by mouth once daily 18)  Miralax Powd (Polyethylene glycol 3350) .Marland Kitchen.. 17grams with water by mouth at bedtime as needed 19)  Lovaza 1 Gm Caps (Omega-3-acid ethyl esters) .... Two caps by mouth two times a day with meals 20)  Xyzal 5 Mg Tabs (Levocetirizine dihydrochloride) .... Take 1 daily 21)  Fluticasone Propionate 50 Mcg/act Susp (Fluticasone propionate) .... Two puffs each nostril daily 22)  Neurontin 800 Mg Tabs (Gabapentin) .... Take 1 two times a day  Other Orders: Influenza Vaccine MCR [id number]) T-CBC No Diff [id number]) T-TSH [id number]) T-Vitamin D (25-Hydroxy) [id number])  Patient Instructions: 1)  Please schedule a follow-up appointment in 3 months. 2)  I have ordered an xray of your abdomen. 3)  I have refilled your medications and changed your allergy pill as we discussed. 4)  Have blood work drawn fasting. Prescriptions: XYZAL 5 MG TABS (LEVOCETIRIZINE DIHYDROCHLORIDE) take 1 daily  #30 x 1   Entered and Authorized by:   Kennith Gain PA   Signed by:   Kennith Gain PA on 05/02/2010   Method used:   Electronically to        [facility] (retail)       [address]       [place], [place]   [address]       Ph: [phone]       Fax: [phone]   RxID:   [id number] NEURONTIN 800 MG TABS (GABAPENTIN) take 1 two times a day  #180 x 3   Entered and Authorized by:   Kennith Gain PA   Signed by:   Kennith Gain PA on 05/02/2010   Method used:   Print then Give to Patient   RxID:   [id number] LORAZEPAM 1 MG TABS (LORAZEPAM) one tab po daily as needed and one tab by mouth  at bedtime  #180 x 1   Entered and Authorized by:   Kennith Gain PA   Signed by:   Kennith Gain PA on 05/02/2010   Method used:   Print then Give to Patient   RxID:   [id number] CYMBALTA 60 MG CPEP (DULOXETINE HCL) one cap by mouth once daily  #90 x 3   Entered and Authorized by:   Kennith Gain PA   Signed by:   Kennith Gain PA on 05/02/2010   Method used:   Print then Give to Patient   RxID:   [id number] EVISTA 60 MG TABS (RALOXIFENE HCL) one tab by mouth once daily  #90 x 3   Entered and Authorized by:   Kennith Gain PA   Signed by:   Kennith Gain PA on 05/02/2010   Method used:   Print then Give to Patient   RxID:   [id number]    Influenza Vaccine    Vaccine Type: Fluvax MCR    Site: right deltoid    Mfr: novartis    Dose: 0.5 ml    Route: IM    Given by: Baldomero Lamy LPN    Exp. Date: 12/2010    Lot #: [id number]    VIS given: 03/13/10 version given May 02, 2010.   Laboratory Results   Urine Tests  Date/Time Received: May 02, 2010 5:14 PM  Date/Time Reported: May 02, 2010 5:14 PM   Routine Urinalysis   Color: yellow Appearance: Clear Glucose: negative   (Normal Range: Negative) Bilirubin: negative   (Normal Range: Negative) Ketone: negative   (Normal Range: Negative) Spec. Gravity: 1.010   (Normal Range: 1.003-1.035) Blood: negative   (Normal Range: Negative) pH: 5.0   (Normal Range: 5.0-8.0) Protein: negative   (Normal Range: Negative) Urobilinogen: 0.2   (Normal Range: 0-1) Nitrite: negative   (Normal Range: Negative) Leukocyte Esterace:  negative   (Normal Range: Negative)

## 2010-09-18 NOTE — Letter (Signed)
Summary: MEDICAL RELEASE  MEDICAL RELEASE   Imported By: Dierdre Harness 12/14/2009 14:18:44  _____________________________________________________________________  External Attachment:    Type:   Image     Comment:   External Document

## 2010-09-18 NOTE — Progress Notes (Signed)
Summary: evista  Phone Note Call from Patient   Complaint: Cough/Sore throat Summary of Call: patient needs her evista 60 mg faxed to [facility] ([facility]) she request the 3 months supply with 3 refills.  there number is [phone] (fax)  Initial call taken by: Eliezer Mccoy,  June 28, 2010 4:49 PM  Follow-up for Phone Call        pls advise 54month supply will be sent in , she needs an appt withme before the additional 3 refills ,can sched in the next 6 to 10 weeks, i have entered it historically , pls fax after speaking with her Follow-up by: Tula Nakayama MD,  June 28, 2010 5:17 PM  Additional Follow-up for Phone Call Additional follow up Details #1::        Called patient, left message Additional Follow-up by: Louie Casa CMA,  June 29, 2010 10:03 AM    Additional Follow-up for Phone Call Additional follow up Details #2::    Called patient, no answer Follow-up by: Louie Casa CMA,  June 29, 2010 2:57 PM  Additional Follow-up for Phone Call Additional follow up Details #3:: Details for Additional Follow-up Action Taken: patient returned call advised of the information above, she has an appt. scheduled for Dec. 22, 2011. Additional Follow-up by: Eliezer Mccoy,  June 29, 2010 4:51 PM  New/Updated Medications: EVISTA 60 MG TABS (RALOXIFENE HCL) Take 1 tablet by mouth once a day Prescriptions: EVISTA 60 MG TABS (RALOXIFENE HCL) Take 1 tablet by mouth once a day  #90 x 0   Entered and Authorized by:   Tula Nakayama MD   Signed by:   Tula Nakayama MD on 06/28/2010   Method used:   Historical   [id number]

## 2010-09-18 NOTE — Letter (Signed)
Summary: Letter  Letter   Imported By: Dierdre Harness 07/26/2010 16:44:38  _____________________________________________________________________  External Attachment:    Type:   Image     Comment:   External Document

## 2010-09-18 NOTE — Letter (Signed)
Summary: [place]  [facility]  [place]  [facility]   Imported By: Dierdre Harness 07/11/2010 10:48:05  _____________________________________________________________________  External Attachment:    Type:   Image     Comment:   External Document

## 2010-09-20 NOTE — Assessment & Plan Note (Signed)
Summary: office visit   Vital Signs:  Patient profile:   60 year old female Height:      65 inches Weight:      197.50 pounds BMI:     32.98 O2 Sat:      97 % on Room air Pulse rate:   61 / minute Pulse rhythm:   regular Resp:     16 per minute BP sitting:   120 / 70  (left arm)  Vitals Entered By: Baldomero Lamy LPN (December 20, 624THL 11:31 AM)  Nutrition Counseling: Patient's BMI is greater than 25 and therefore counseled on weight management options.  O2 Flow:  Room air CC: follow-up visit lab review Is Patient Diabetic? No   Primary Care Provider:  Tula Nakayama MD  CC:  follow-up visit lab review.  History of Present Illness: Reports  that she has not been doing very well. She reports excessive hair loss, coarse voice , cold intolerance , weight gain, and states her mother had throid disease and feels this is a problem in her as well Denies recent fever or chills. Denies sinus pressure, nasal congestion , ear pain or sore throat. Denies chest congestion, or cough productive of sputum. Denies chest pain, palpitations, PND, orthopnea or leg swelling. Denies abdominal pain, nausea, vomitting, diarrhea or constipation. Denies change in bowel movements or bloody stool. Denies dysuria , frequency, incontinence or hesitancy. Denies  joint pain, swelling, or reduced mobility. Denies headaches, vertigo, seizures. Denies depression, anxiety or insomnia. Denies  rash, lesions, or itch.     Current Medications (verified): 1)  Toprol Xl 100 Mg Xr24h-Tab (Metoprolol Succinate) .... One Tab By Mouth Once Daily 2)  Lipitor 40 Mg Tabs (Atorvastatin Calcium) .... One Tab By Mouth Once Daily 3)  Torsemide 20 Mg Tabs (Torsemide) .... One To Two Tabs By Mouth Once Daily 4)  Aspir-Low 81 Mg Tbec (Aspirin) .... One Tab By Mouth Once Daily 5)  Trilipix 135 Mg Cpdr (Choline Fenofibrate) .... One Tab By Mouth Once Daily 6)  Calcium-Vitamin D 600-125 Mg-Unit Tabs (Calcium-Vitamin D)  .... One To Two Tabs By Mouth Once Daily 7)  Protonix 40 Mg Tbec (Pantoprazole Sodium) .... One To Two Tabs By Mouth Once Daily 8)  Flexeril 5 Mg Tabs (Cyclobenzaprine Hcl) .... As Needed 9)  Lorazepam 1 Mg Tabs (Lorazepam) .... One Tab Po Daily As Needed and One Tab By Mouth At Bedtime 10)  Cymbalta 60 Mg Cpep (Duloxetine Hcl) .... One Cap By Mouth Once Daily 11)  Vitamin D 1000 Unit Tabs (Cholecalciferol) .... Two Tabs By Mouth Once Daily 12)  Hydrocodone-Acetaminophen 7.5-325 Mg Tabs (Hydrocodone-Acetaminophen) .... One Tab By Mouth Two Times A Day As Needed For Break Through Pain 13)  Senokot S 8.6-50 Mg Tabs (Sennosides-Docusate Sodium) .... One Tab By Mouth At Bedtime 14)  Stool Softener 100 Mg Caps (Docusate Sodium) .... One Cap By Mouth Two Times A Day 15)  Qc Daily Multivitamins/iron  Tabs (Multiple Vitamins-Iron) .... One Tab By Mouth Once Daily 16)  Miralax  Powd (Polyethylene Glycol 3350) .Marland Kitchen.. 17grams With Water By Mouth At Bedtime As Needed 17)  Lovaza 1 Gm Caps (Omega-3-Acid Ethyl Esters) .... Two Caps By Mouth Two Times A Day With Meals 18)  Xyzal 5 Mg Tabs (Levocetirizine Dihydrochloride) .... Take 1 Daily 19)  Fluticasone Propionate 50 Mcg/act Susp (Fluticasone Propionate) .... Two Puffs Each Nostril Daily 20)  Neurontin 800 Mg Tabs (Gabapentin) .... Take 1 Two Times A Day 21)  Evista 60 Mg  Tabs (Raloxifene Hcl) .... Take 1 Tablet By Mouth Once A Day 22)  Fentanyl 50 Mcg/hr Pt72 (Fentanyl) .... Change Patch Every 2 Days  Allergies (verified): 1)  ! Duragesic-100 (Fentanyl)  Review of Systems      See HPI General:  Complains of fatigue. Eyes:  Denies blurring, discharge, double vision, and eye irritation. Psych:  Complains of anxiety, depression, and mental problems; denies suicidal thoughts/plans, thoughts of violence, and unusual visions or sounds; controlled on meds. Endo:  Complains of cold intolerance and weight change; denies excessive hunger, excessive thirst,  excessive urination, and heat intolerance. Heme:  Denies abnormal bruising, bleeding, enlarge lymph nodes, and fevers. Allergy:  Complains of seasonal allergies; denies hives or rash, itching eyes, and persistent infections.  Physical Exam  General:  Well-developed,well-nourished,in no acute distress; alert,appropriate and cooperative throughout examination HEENT: No facial asymmetry, goiter EOMI, No sinus tenderness, TM's Clear, oropharynx  pink and moist.   Chest: Clear to auscultation bilaterally.  CVS: S1, S2, No murmurs, No S3.   Abd: Soft, Nontender.  MS: Adequate ROM spine, hips, shoulders and knees.  Ext: No edema.   CNS: CN 2-12 intact, power tone and sensation normal throughout.   Skin: Intact, no visible lesions or rashes.  Psych: Good eye contact, normal affect.  Memory intact, not anxious or depressed appearing.    Impression & Recommendations:  Problem # 1:  HYPERLIPIDEMIA (B2193296.4) Assessment Comment Only  Her updated medication list for this problem includes:    Lipitor 40 Mg Tabs (Atorvastatin calcium) ..... One tab by mouth once daily    Trilipix 135 Mg Cpdr (Choline fenofibrate) ..... One tab by mouth once daily    Lovaza 1 Gm Caps (Omega-3-acid ethyl esters) .Marland Kitchen..Marland Kitchen Two caps by mouth two times a day with meals  Labs Reviewed: SGOT: 24 (06/20/2010)   SGPT: 9 (06/20/2010)   HDL:53 (06/20/2010)  LDL:66 (06/20/2010)  Chol:150 (06/20/2010)  Trig:155 (06/20/2010)  Problem # 2:  UNSPECIFIED HYPOTHYROIDISM (ICD-244.9) Assessment: Comment Only  Labs Reviewed: TSH: 8.955 (06/20/2010)    Chol: 150 (06/20/2010)   HDL: 53 (06/20/2010)   LDL: 66 (06/20/2010)   TG: 155 (06/20/2010) will order thyroid US for further eval  Problem # 3:  HYPERTENSION (ICD-401.9) Assessment: Improved  Her updated medication list for this problem includes:    Toprol Xl 100 Mg Xr24h-tab (Metoprolol succinate) ..... One tab by mouth once daily    Torsemide 20 Mg Tabs (Torsemide) ..... One  to two tabs by mouth once daily  BP today: 120/70 Prior BP: 140/70 (05/02/2010)  Labs Reviewed: K+: 4.2 (06/20/2010) Creat: : 1.82 (06/20/2010)   Chol: 150 (06/20/2010)   HDL: 53 (06/20/2010)   LDL: 66 (06/20/2010)   TG: 155 (06/20/2010)  Problem # 4:  DEPRESSION (ICD-311) Assessment: Unchanged  Her updated medication list for this problem includes:    Lorazepam 1 Mg Tabs (Lorazepam) ..... One tab po daily as needed and one tab by mouth at bedtime    Cymbalta 60 Mg Cpep (Duloxetine hcl) ..... One cap by mouth once daily  Problem # 5:  ANXIETY (ICD-300.00) Assessment: Improved  Her updated medication list for this problem includes:    Lorazepam 1 Mg Tabs (Lorazepam) ..... One tab po daily as needed and one tab by mouth at bedtime    Cymbalta 60 Mg Cpep (Duloxetine hcl) ..... One cap by mouth once daily  Problem # 6:  ALLERGIC RHINITIS (ICD-477.9) Assessment: Unchanged  Her updated medication list for this problem includes:  Xyzal 5 Mg Tabs (Levocetirizine dihydrochloride) .Marland Kitchen... Take 1 daily    Fluticasone Propionate 50 Mcg/act Susp (Fluticasone propionate) .Marland Kitchen..Marland Kitchen Two puffs each nostril daily  Complete Medication List: 1)  Toprol Xl 100 Mg Xr24h-tab (Metoprolol succinate) .... One tab by mouth once daily 2)  Lipitor 40 Mg Tabs (Atorvastatin calcium) .... One tab by mouth once daily 3)  Torsemide 20 Mg Tabs (Torsemide) .... One to two tabs by mouth once daily 4)  Aspir-low 81 Mg Tbec (Aspirin) .... One tab by mouth once daily 5)  Trilipix 135 Mg Cpdr (Choline fenofibrate) .... One tab by mouth once daily 6)  Calcium-vitamin D 600-125 Mg-unit Tabs (Calcium-vitamin d) .... One to two tabs by mouth once daily 7)  Protonix 40 Mg Tbec (Pantoprazole sodium) .... One to two tabs by mouth once daily 8)  Flexeril 5 Mg Tabs (Cyclobenzaprine hcl) .... As needed 9)  Lorazepam 1 Mg Tabs (Lorazepam) .... One tab po daily as needed and one tab by mouth at bedtime 10)  Cymbalta 60 Mg Cpep  (Duloxetine hcl) .... One cap by mouth once daily 11)  Vitamin D 1000 Unit Tabs (Cholecalciferol) .... Two tabs by mouth once daily 12)  Hydrocodone-acetaminophen 7.5-325 Mg Tabs (Hydrocodone-acetaminophen) .... One tab by mouth two times a day as needed for break through pain 13)  Senokot S 8.6-50 Mg Tabs (Sennosides-docusate sodium) .... One tab by mouth at bedtime 14)  Stool Softener 100 Mg Caps (Docusate sodium) .... One cap by mouth two times a day 15)  Qc Daily Multivitamins/iron Tabs (Multiple vitamins-iron) .... One tab by mouth once daily 16)  Miralax Powd (Polyethylene glycol 3350) .Marland Kitchen.. 17grams with water by mouth at bedtime as needed 17)  Lovaza 1 Gm Caps (Omega-3-acid ethyl esters) .... Two caps by mouth two times a day with meals 18)  Xyzal 5 Mg Tabs (Levocetirizine dihydrochloride) .... Take 1 daily 19)  Fluticasone Propionate 50 Mcg/act Susp (Fluticasone propionate) .... Two puffs each nostril daily 20)  Neurontin 800 Mg Tabs (Gabapentin) .... Take 1 two times a day 21)  Evista 60 Mg Tabs (Raloxifene hcl) .... Take 1 tablet by mouth once a day 22)  Fentanyl 50 Mcg/hr Pt72 (Fentanyl) .... Change patch every 2 days  Other Orders: T- Hemoglobin A1C [id number]) T-Ferritin [id number]) Alric Quan [id number]) Radiology Referral (Radiology) Tdap => 65yrs IM [id number]) Admin 1st Vaccine [id number])  Patient Instructions: 1)  Please schedule a follow-up appointment in 2 months. 2)  HbgA1C prior to visit, ICD-9: 3)  Iron and ferritin   today 4)  It is important that you exercise regularly at least 30 minutes 5 times a week. If you develop chest pain, have severe difficulty breathing, or feel very tired , stop exercising immediately and seek medical attention. 5)  You need to lose weight. Consider a lower calorie diet and regular exercise.  6)  You need an [place] of thyroid gland as soon as possible so you can be started on thyroid treatment   Orders Added: 1)  Est. Patient Level IV  [99214] 2)  T- Hemoglobin A1C [83036-23375] 3)  T-Ferritin [[id number]] 4)  T-Iron [id number] 5)  Radiology Referral [Radiology] 6)  Tdap => 50yrs IM [id number] 7)  Admin 1st Vaccine [id number]   Immunizations Administered:  Tetanus Vaccine:    Vaccine Type: Tdap    Site: right deltoid    Mfr: GlaxoSmithKline    Dose: 0.5 ml    Route: IM    Given by: Baldomero Lamy LPN    Exp.  Date: 06/07/2012    Lot #: [id number]    VIS given: 07/06/08 version given August 07, 2010.   Immunizations Administered:  Tetanus Vaccine:    Vaccine Type: Tdap    Site: right deltoid    Mfr: GlaxoSmithKline    Dose: 0.5 ml    Route: IM    Given by: Baldomero Lamy LPN    Exp. Date: 06/07/2012    Lot #: [id number]    VIS given: 07/06/08 version given August 07, 2010.

## 2010-09-20 NOTE — Medication Information (Signed)
Summary: [employer] [facility]   Imported By: Dierdre Harness 08/07/2010 14:15:33  _____________________________________________________________________  External Attachment:    Type:   Image     Comment:   External Document

## 2010-09-20 NOTE — Progress Notes (Signed)
  Phone Note Other Incoming   Caller: dr Welton Bord Summary of Call: pls order TSH on [facility] in mid to end Feb evaluate hypothyroidism, pls let her know to collect theorder/that it's faxed in and put a copy to Dr Loanne Drilling , thanks Initial call taken by: Tula Nakayama MD,  September 02, 2010 12:39 PM  Follow-up for Phone Call        lab ordered, called patient, left message Follow-up by: Kate Sable LPN,  January 16, [id number] 1:53 PM  Additional Follow-up for Phone Call Additional follow up Details #1::        Patient aware, faxed to the lab  Additional Follow-up by: Kate Sable LPN,  January 16, [id number] 4:24 PM

## 2010-09-20 NOTE — Assessment & Plan Note (Signed)
Summary: NEW ENDO CON/ THYROID/ NOTES IN EMR/MEDICARE/ BCBS/ NWS  #   Vital Signs:  Patient profile:   60 year old female Height:      65 inches (165.10 cm) Weight:      190 pounds (86.36 kg) BMI:     31.73 O2 Sat:      94 % on Room air Temp:     98.4 degrees F (36.89 degrees C) oral Pulse rate:   64 / minute Pulse rhythm:   regular BP sitting:   112 / 72  (left arm) Cuff size:   regular  Vitals Entered By: Rebeca Alert CMA Deborra Medina) (August 27, 2010 9:13 AM)  O2 Flow:  Room air CC: New Endo Consult/Thyroid/Dr. Simpson/aj Is Patient Diabetic? No   Referring Provider:  Tula Nakayama MD Primary Provider:  Tula Nakayama MD  CC:  New Endo Consult/Thyroid/Dr. Simpson/aj.  History of Present Illness: pt was noted on routine blood test to have an elevated tsh.  ultrasound was abnormal.   symptomatically, pt states slight hoarseness sensation in the neck, and associated fatigue.    Current Medications (verified): 1)  Toprol Xl 100 Mg Xr24h-Tab (Metoprolol Succinate) .... One Tab By Mouth Once Daily 2)  Lipitor 40 Mg Tabs (Atorvastatin Calcium) .... One Tab By Mouth Once Daily 3)  Torsemide 20 Mg Tabs (Torsemide) .... One To Two Tabs By Mouth Once Daily 4)  Aspir-Low 81 Mg Tbec (Aspirin) .... One Tab By Mouth Once Daily 5)  Trilipix 135 Mg Cpdr (Choline Fenofibrate) .... One Tab By Mouth Once Daily 6)  Calcium-Vitamin D 600-125 Mg-Unit Tabs (Calcium-Vitamin D) .... One To Two Tabs By Mouth Once Daily 7)  Protonix 40 Mg Tbec (Pantoprazole Sodium) .... One To Two Tabs By Mouth Once Daily 8)  Flexeril 5 Mg Tabs (Cyclobenzaprine Hcl) .... As Needed 9)  Lorazepam 1 Mg Tabs (Lorazepam) .... One Tab Po Daily As Needed and One Tab By Mouth At Bedtime 10)  Cymbalta 60 Mg Cpep (Duloxetine Hcl) .... One Cap By Mouth Once Daily 11)  Vitamin D 1000 Unit Tabs (Cholecalciferol) .... Two Tabs By Mouth Once Daily 12)  Hydrocodone-Acetaminophen 7.5-325 Mg Tabs (Hydrocodone-Acetaminophen) ....  One Tab By Mouth Two Times A Day As Needed For Break Through Pain 13)  Senokot S 8.6-50 Mg Tabs (Sennosides-Docusate Sodium) .... One Tab By Mouth At Bedtime 14)  Stool Softener 100 Mg Caps (Docusate Sodium) .... One Cap By Mouth Two Times A Day 15)  Qc Daily Multivitamins/iron  Tabs (Multiple Vitamins-Iron) .... One Tab By Mouth Once Daily 16)  Miralax  Powd (Polyethylene Glycol 3350) .Marland Kitchen.. 17grams With Water By Mouth At Bedtime As Needed 17)  Lovaza 1 Gm Caps (Omega-3-Acid Ethyl Esters) .... Two Caps By Mouth Two Times A Day With Meals 18)  Xyzal 5 Mg Tabs (Levocetirizine Dihydrochloride) .... Take 1 Daily 19)  Fluticasone Propionate 50 Mcg/act Susp (Fluticasone Propionate) .... Two Puffs Each Nostril Daily 20)  Neurontin 800 Mg Tabs (Gabapentin) .... Take 1 Two Times A Day 21)  Evista 60 Mg Tabs (Raloxifene Hcl) .... Take 1 Tablet By Mouth Once A Day 22)  Fentanyl 50 Mcg/hr Pt72 (Fentanyl) .... Change Patch Every 2 Days  Allergies (verified): 1)  ! Duragesic-100 (Fentanyl)  Past History:  Past Medical History: Last updated: 12/11/2009 Allergic rhinitis Anxiety Depression GERD Hypertension Chronic back pain Chronic constipation Palptations CKD Angina  GYN - Dr Glo Herring GI - Dr Daisey Must Cardiologist - Dr Claiborne Billings Nephrology - Dr Lowanda Foster Neuro surg -  Dr Louanne Skye  Family History: Reviewed history from 12/11/2009 and no changes required. mother deceased- osteoporosis, rheumatoid arthritis father deceased- heart disease, DM, HTN, hyperlipidemia, bladder cancer two brothers living- arthritis, heart mumor goiter: grandmother  Social History: Reviewed history from 12/11/2009 and no changes required. Disabled Married 42 years Two grown children Quit smoking 2005 Alcohol use-no Drug use-no Regular exercise-no  Review of Systems       The patient complains of weight gain and depression.         denies menopausal sxs, galactorrrhea, fever, dysuria, headache, rash, blurry  vision, chest pain.  she has chronic polyuria, leg weakness, easy bruising, doe, rhinorrhea, and numbness of the left leg.    Physical Exam  General:  normal appearance.   Head:  head: no deformity eyes: no periorbital swelling, no proptosis external nose and ears are normal mouth: no lesion seen Neck:  the thyroid is slightly enlarged, right > left.  i do not appreciate a nodule.   Lungs:  Clear to auscultation bilaterally. Normal respiratory effort.  Heart:  Regular rate and rhythm without murmurs or gallops noted. Normal S1,S2.   Msk:  muscle bulk and strength are grossly normal.  no obvious joint swelling.  gait is normal and steady  Extremities:  no deformity trace right pedal edema and trace left pedal edema.   Neurologic:  cn 2-12 grossly intact.   readily moves all 4's.   sensation is intact to touch on all 4's.   Skin:  normal texture and temp.  no rash.  not diaphoretic  Cervical Nodes:  No significant adenopathy.  Psych:  Alert and cooperative; normal mood and affect; normal attention span and concentration.   Additional Exam:  outside test results are reviewed:  tsh=8 THYROID ULTRASOUND 08/10/2010: Borderline enlarged thyroid gland with heterogeneous echotexture diffusely.  No dominant nodules; solitary approximate 6 mm solid nodule in the midportion of the right lobe near the isthmus. 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 1 tab once daily  Other Orders: Est. Patient Level V [id number]  Patient Instructions: 1)  start levothyroxine 50 micrograms/day.  i have sent to [facility]. 2)   recheck tsh in 4-6 weeks 244.9.  i think dr Moshe Cipro wold be glad to do this at her office. i would be happy to review.  goal would be normal level. 3)  Please schedule a follow-up appointment in 1 year. Prescriptions: LEVOTHYROXINE SODIUM 50 MCG TABS (LEVOTHYROXINE SODIUM) 1 tab once daily  #90 x 3   Entered and Authorized by:   Donavan Foil MD   Signed by:   Donavan Foil MD on 08/27/2010   Method used:   Electronically to        [facility] (mail-order)       [address] [place]  [address]       Ph: [phone]       Fax: [phone]   RxID:   [id number]    Orders Added: 1)  Est. Patient Level V [id number]   Immunization History:  Pneumovax Immunization History:  Pneumovax:  historical (08/19/1998)   Immunization History:  Pneumovax Immunization History:    Pneumovax:  Historical (08/19/1998)   Preventive Care Screening  Colonoscopy:    Date:  08/19/2002    Results:  normal

## 2010-09-20 NOTE — Miscellaneous (Signed)
  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

## 2010-09-26 NOTE — Letter (Signed)
[facility]   Imported By: Dierdre Harness 09/18/2010 13:09:09  _____________________________________________________________________  External Attachment:    Type:   Image     Comment:   External Document

## 2010-10-03 ENCOUNTER — Encounter: Payer: Self-pay | Admitting: Family Medicine

## 2010-10-09 ENCOUNTER — Encounter: Payer: Self-pay | Admitting: Family Medicine

## 2010-10-09 ENCOUNTER — Ambulatory Visit (INDEPENDENT_AMBULATORY_CARE_PROVIDER_SITE_OTHER): Payer: Medicare Other | Admitting: Family Medicine

## 2010-10-09 DIAGNOSIS — F411 Generalized anxiety disorder: Secondary | ICD-10-CM

## 2010-10-09 DIAGNOSIS — E785 Hyperlipidemia, unspecified: Secondary | ICD-10-CM

## 2010-10-09 DIAGNOSIS — I10 Essential (primary) hypertension: Secondary | ICD-10-CM

## 2010-10-09 DIAGNOSIS — E039 Hypothyroidism, unspecified: Secondary | ICD-10-CM

## 2010-10-09 DIAGNOSIS — J309 Allergic rhinitis, unspecified: Secondary | ICD-10-CM

## 2010-10-17 DIAGNOSIS — R5381 Other malaise: Secondary | ICD-10-CM | POA: Insufficient documentation

## 2010-10-17 DIAGNOSIS — R5383 Other fatigue: Secondary | ICD-10-CM

## 2010-10-25 ENCOUNTER — Encounter: Payer: Self-pay | Admitting: Family Medicine

## 2010-10-25 NOTE — Assessment & Plan Note (Signed)
Summary: F UP 2 MOTHS   Vital Signs:  Patient profile:   60 year old female Height:      65 inches Weight:      198 pounds BMI:     33.07 O2 Sat:      96 % Pulse rate:   71 / minute Pulse rhythm:   regular Resp:     16 per minute BP sitting:   124 / 74  (right arm)  Vitals Entered By: Kate Sable LPN (February 21, [id number] 1:19 PM)  Nutrition Counseling: Patient's BMI is greater than 25 and therefore counseled on weight management options. CC: Follow up chronic problems   Primary Care Provider:  Tula Nakayama MD  CC:  Follow up chronic problems.  History of Present Illness: Reports  that  she is fairly well. She did see endocrine since her visit, and has a 1 year return, she is on replacement, and no biopsy was indicated. Denies recent fever or chills. Denies sinus pressure, nasal congestion , ear pain or sore throat. Denies chest congestion, or cough productive of sputum. Denies chest pain, palpitations, PND, orthopnea or leg swelling. Denies abdominal pain, nausea, vomitting, diarrhea or constipation. Denies change in bowel movements or bloody stool. Denies dysuria , frequency, incontinence or hesitancy. . Denies headaches, vertigo, seizures. Symptoms of depression and anxiety have worsened. Denies  rash, lesions, or itch.     Current Medications (verified): 1)  Toprol Xl 100 Mg Xr24h-Tab (Metoprolol Succinate) .... One Tab By Mouth Once Daily 2)  Lipitor 40 Mg Tabs (Atorvastatin Calcium) .... One Tab By Mouth Once Daily 3)  Torsemide 20 Mg Tabs (Torsemide) .... One To Two Tabs By Mouth Once Daily 4)  Aspir-Low 81 Mg Tbec (Aspirin) .... One Tab By Mouth Once Daily 5)  Trilipix 135 Mg Cpdr (Choline Fenofibrate) .... One Tab By Mouth Once Daily 6)  Calcium-Vitamin D 600-125 Mg-Unit Tabs (Calcium-Vitamin D) .... One To Two Tabs By Mouth Once Daily 7)  Protonix 40 Mg Tbec (Pantoprazole Sodium) .... One To Two Tabs By Mouth Once Daily 8)  Flexeril 5 Mg Tabs  (Cyclobenzaprine Hcl) .... As Needed 9)  Lorazepam 1 Mg Tabs (Lorazepam) .... One Tab Po Daily As Needed and One Tab By Mouth At Bedtime 10)  Cymbalta 60 Mg Cpep (Duloxetine Hcl) .... One Cap By Mouth Once Daily 11)  Vitamin D 1000 Unit Tabs (Cholecalciferol) .... Two Tabs By Mouth Once Daily 12)  Hydrocodone-Acetaminophen 7.5-325 Mg Tabs (Hydrocodone-Acetaminophen) .... One Tab By Mouth Two Times A Day As Needed For Break Through Pain 13)  Senokot S 8.6-50 Mg Tabs (Sennosides-Docusate Sodium) .... One Tab By Mouth At Bedtime 14)  Stool Softener 100 Mg Caps (Docusate Sodium) .... One Cap By Mouth Two Times A Day 15)  Qc Daily Multivitamins/iron  Tabs (Multiple Vitamins-Iron) .... One Tab By Mouth Once Daily 16)  Miralax  Powd (Polyethylene Glycol 3350) .Marland Kitchen.. 17grams With Water By Mouth At Bedtime As Needed 17)  Lovaza 1 Gm Caps (Omega-3-Acid Ethyl Esters) .... Two Caps By Mouth Two Times A Day With Meals 18)  Xyzal 5 Mg Tabs (Levocetirizine Dihydrochloride) .... Take 1 Daily 19)  Fluticasone Propionate 50 Mcg/act Susp (Fluticasone Propionate) .... Two Puffs Each Nostril Daily 20)  Neurontin 800 Mg Tabs (Gabapentin) .... Take 1 Two Times A Day 21)  Evista 60 Mg Tabs (Raloxifene Hcl) .... Take 1 Tablet By Mouth Once A Day 22)  Fentanyl 50 Mcg/hr Pt72 (Fentanyl) .... Change Patch Every 2 Days 23)  Levothyroxine Sodium 50 Mcg Tabs (Levothyroxine Sodium) .Marland Kitchen.. 1 Tab Once Daily  Allergies (verified): 1)  ! Duragesic-100 (Fentanyl)  Review of Systems      See HPI General:  Complains of fatigue. Eyes:  Complains of vision loss-both eyes; denies discharge and eye pain. MS:  Complains of low back pain and mid back pain; chronic. Psych:  Complains of anxiety, depression, and mental problems; denies suicidal thoughts/plans, thoughts of violence, and unusual visions or sounds. Endo:  Denies cold intolerance, excessive hunger, excessive thirst, and excessive urination. Heme:  Denies abnormal bruising,  bleeding, enlarge lymph nodes, and fevers. Allergy:  Complains of seasonal allergies; increased allergy symptoms in the past 1 month.  Physical Exam  General:  Well-developed,well-nourished,in no acute distress; alert,appropriate and cooperative throughout examination HEENT: No facial asymmetry, goiter EOMI, No sinus tenderness, TM's Clear, oropharynx  pink and moist.   Chest: Clear to auscultation bilaterally.  CVS: S1, S2, No murmurs, No S3.   Abd: Soft, Nontender.  MS: decreased  ROM spine,adequate in  hips, shoulders and knees.  Ext: No edema.   CNS: CN 2-12 intact, power tone and sensation normal throughout.   Skin: Intact, no visible lesions or rashes.  Psych: Good eye contact, normal affect.  Memory intact, anxious and depressed appearing.    Impression & Recommendations:  Problem # 1:  ALLERGIC RHINITIS CAUSE UNSPECIFIED (ICD-477.9) Assessment Deteriorated  Her updated medication list for this problem includes:    Xyzal 5 Mg Tabs (Levocetirizine dihydrochloride) .Marland Kitchen... Take 1 daily    Flonase 50 Mcg/act Susp (Fluticasone propionate) .Marland Kitchen... 2 puffs each nostril daily  Orders: Medicare Electronic Prescription [phone])  Problem # 2:  UNSPECIFIED HYPOTHYROIDISM (ICD-244.9) Assessment: Comment Only  Her updated medication list for this problem includes:    Levothyroxine Sodium 50 Mcg Tabs (Levothyroxine sodium) .Marland Kitchen... 1 tab once daily  Orders: T-TSH [id number])  Labs Reviewed: TSH: 8.955 (06/20/2010)    HgBA1c: 5.2 (08/07/2010) Chol: 150 (06/20/2010)   HDL: 53 (06/20/2010)   LDL: 66 (06/20/2010)   TG: 155 (06/20/2010)  Problem # 3:  HYPERLIPIDEMIA (ICD-272.4) Assessment: Comment Only  Her updated medication list for this problem includes:    Lipitor 40 Mg Tabs (Atorvastatin calcium) ..... One tab by mouth once daily    Trilipix 135 Mg Cpdr (Choline fenofibrate) ..... One tab by mouth once daily    Lovaza 1 Gm Caps (Omega-3-acid ethyl esters) .Marland Kitchen..Marland Kitchen Two caps by mouth  two times a day with meals will need rept lipids before next visit Labs Reviewed: SGOT: 24 (06/20/2010)   SGPT: 9 (06/20/2010)   HDL:53 (06/20/2010)  LDL:66 (06/20/2010)  Chol:150 (06/20/2010)  Trig:155 (06/20/2010)  Problem # 4:  HYPERTENSION (ICD-401.9) Assessment: Unchanged  Her updated medication list for this problem includes:    Toprol Xl 100 Mg Xr24h-tab (Metoprolol succinate) ..... One tab by mouth once daily    Torsemide 20 Mg Tabs (Torsemide) ..... One to two tabs by mouth once daily  BP today: 124/74 Prior BP: 112/72 (08/27/2010)  Labs Reviewed: K+: 4.2 (06/20/2010) Creat: : 1.82 (06/20/2010)   Chol: 150 (06/20/2010)   HDL: 53 (06/20/2010)   LDL: 66 (06/20/2010)   TG: 155 (06/20/2010)  Problem # 5:  DEPRESSION (ICD-311) Assessment: Deteriorated  Her updated medication list for this problem includes:    Lorazepam 1 Mg Tabs (Lorazepam) ..... One tab po daily as needed and one tab by mouth at bedtime    Cymbalta 60 Mg Cpep (Duloxetine hcl) ..... One cap by mouth once daily  Discussed treatment options, including trial of antidpressant medication. Will refer to behavioral health. Follow-up call in in 24-48 hours and recheck in 2 weeks, sooner as needed. Patient agrees to call if any worsening of symptoms or thoughts of doing harm arise. Verified that the patient has no suicidal ideation at this time.   Problem # 6:  ANXIETY (ICD-300.00) Assessment: Deteriorated  Her updated medication list for this problem includes:    Lorazepam 1 Mg Tabs (Lorazepam) ..... One tab po daily as needed and one tab by mouth at bedtime    Cymbalta 60 Mg Cpep (Duloxetine hcl) ..... One cap by mouth once daily  Discussed medication use and relaxation techniques.   Problem # 7:  LOW BACK PAIN, CHRONIC (ICD-724.2) Assessment: Unchanged  Her updated medication list for this problem includes:    Aspir-low 81 Mg Tbec (Aspirin) ..... One tab by mouth once daily    Flexeril 5 Mg Tabs  (Cyclobenzaprine hcl) .Marland Kitchen... As needed    Hydrocodone-acetaminophen 7.5-325 Mg Tabs (Hydrocodone-acetaminophen) ..... One tab by mouth two times a day as needed for break through pain    Fentanyl 50 Mcg/hr Pt72 (Fentanyl) .Marland Kitchen... Change patch every 2 days  Complete Medication List: 1)  Toprol Xl 100 Mg Xr24h-tab (Metoprolol succinate) .... One tab by mouth once daily 2)  Lipitor 40 Mg Tabs (Atorvastatin calcium) .... One tab by mouth once daily 3)  Torsemide 20 Mg Tabs (Torsemide) .... One to two tabs by mouth once daily 4)  Aspir-low 81 Mg Tbec (Aspirin) .... One tab by mouth once daily 5)  Trilipix 135 Mg Cpdr (Choline fenofibrate) .... One tab by mouth once daily 6)  Calcium-vitamin D 600-125 Mg-unit Tabs (Calcium-vitamin d) .... One to two tabs by mouth once daily 7)  Protonix 40 Mg Tbec (Pantoprazole sodium) .... One to two tabs by mouth once daily 8)  Flexeril 5 Mg Tabs (Cyclobenzaprine hcl) .... As needed 9)  Lorazepam 1 Mg Tabs (Lorazepam) .... One tab po daily as needed and one tab by mouth at bedtime 10)  Cymbalta 60 Mg Cpep (Duloxetine hcl) .... One cap by mouth once daily 11)  Vitamin D 1000 Unit Tabs (Cholecalciferol) .... Two tabs by mouth once daily 12)  Hydrocodone-acetaminophen 7.5-325 Mg Tabs (Hydrocodone-acetaminophen) .... One tab by mouth two times a day as needed for break through pain 13)  Senokot S 8.6-50 Mg Tabs (Sennosides-docusate sodium) .... One tab by mouth at bedtime 14)  Stool Softener 100 Mg Caps (Docusate sodium) .... One cap by mouth two times a day 15)  Qc Daily Multivitamins/iron Tabs (Multiple vitamins-iron) .... One tab by mouth once daily 16)  Miralax Powd (Polyethylene glycol 3350) .Marland Kitchen.. 17grams with water by mouth at bedtime as needed 17)  Lovaza 1 Gm Caps (Omega-3-acid ethyl esters) .... Two caps by mouth two times a day with meals 18)  Xyzal 5 Mg Tabs (Levocetirizine dihydrochloride) .... Take 1 daily 19)  Flonase 50 Mcg/act Susp (Fluticasone propionate)  .... 2 puffs each nostril daily 20)  Neurontin 800 Mg Tabs (Gabapentin) .... Take 1 two times a day 21)  Evista 60 Mg Tabs (Raloxifene hcl) .... Take 1 tablet by mouth once a day 22)  Fentanyl 50 Mcg/hr Pt72 (Fentanyl) .... Change patch every 2 days 23)  Levothyroxine Sodium 50 Mcg Tabs (Levothyroxine sodium) .Marland Kitchen.. 1 tab once daily  Patient Instructions: 1)  Please schedule a follow-up appointment in 4 months. 2)  It is important that you exercise regularly at least 20 minutes  5 times a week. If you develop chest pain, have severe difficulty breathing, or feel very tired , stop exercising immediately and seek medical attention. 3)  You need to lose weight. Consider a lower calorie diet and regular exercise.  4)  TSH prior to visit, ICD-9:  in 4 months 5)  Pls consider seeing a therapist.  Prescriptions: FLONASE 50 MCG/ACT SUSP (FLUTICASONE PROPIONATE) 2 puffs each nostril daily  #60months x 0   Entered by:   Kate Sable LPN   Authorized by:   Tula Nakayama MD   Signed by:   Kate Sable LPN on [id number]   Method used:   Electronically to        [facility] (mail-order)       [place]  [address]       Ph: [phone]       Fax: [phone]   RxID:   [id number]    Orders Added: 1)  Est. Patient Level IV [id number] 2)  Medicare Electronic Prescription [id number] 3)  T-TSH [id number]  Appended Document: F UP 2 MOTHS pls advise pt she will need to fast before her next labs, I had only originally requested a tsh, but she also has chem 7 , lipid and hepatic, pls fax a new order to the lab and notify the lab also, this is as well as the TSH  Appended Document: F UP 2 MOTHS called patient left message  Appended Document: F UP 2 MOTHS called patient left message  Appended Document: Orders Update    Clinical Lists Changes  Problems: Added new problem of FATIGUE (ICD-780.79) Orders: Added new Test order of T-Basic Metabolic Panel  ([id number]) - Signed Added new Test order of T-Hepatic Function [id number]) - Signed Added new Test order of T-Lipid Profile [id number]) - Signed Added new Test order of T-TSH [id number]) - Signed      Appended Document: F UP 2 MOTHS Pt aware

## 2010-11-06 NOTE — Letter (Signed)
Summary: [facility]  [facility]   Imported By: Dierdre Harness 10/31/2010 16:02:17  _____________________________________________________________________  External Attachment:    Type:   Image     Comment:   External Document

## 2011-01-01 NOTE — Procedures (Signed)
[facility], [facility]               ACCOUNT NO.:  [id number]   MEDICAL RECORD NO.:  [id number]          PATIENT TYPE:  REC   LOCATION:  [facility]                          FACILITY:  [facility]   PHYSICIAN:  Charlett Blake, M.D.DATE OF BIRTH:  10-04-1950   DATE OF PROCEDURE:  02/04/2008  DATE OF DISCHARGE:                               OPERATIVE REPORT   This is a left L3-L4 transforaminal lumbar epidural steroid injection  under fluoroscopic guidance.   INDICATION:  Left L3 radiculitis with pain going down the left leg,  lateral thigh region, only partially relieved by medication management,  and interfering with mobility as well as ADLs.  She has had prior relief  lasting around 3 months with the same injection done in February 2009.  It has now worn off.   Informed consent was obtained after describing risks and benefits of the  procedure with the patient.  These include bleeding, bruising, and  infection as well as temporary or permanent paralysis.  She elects to  proceed and has given written consent.   The patient placed prone on fluoroscopy table.  Betadine prep, sterile  drape.  A 25-gauge 1-1/2-inch needle was inserted under fluoroscopic  guidance starting at the left L3-L4 intervertebral foramen.  AP,  lateral, and oblique images were utilized.  Omnipaque 180 x 0.5 mL  demonstrated no intravascular uptake.  Then, solution containing 1 mL of  10 mg/mL dexamethasone and 2 mL of 1% MPF lidocaine were injected.  The  patient tolerated the procedure well.  Pre- and post-injection vitals  were stable.  Post-injection instructions were given.  She will return  in 1 month for recheck.      Charlett Blake, M.D.  Electronically Signed     AEK/MEDQ  D:  02/04/2008 14:01:43  T:  02/05/2008 01:55:36  Job:  [id number]

## 2011-01-01 NOTE — Procedures (Signed)
[facility], [facility]               ACCOUNT NO.:  [id number]   MEDICAL RECORD NO.:  [id number]           PATIENT TYPE:   LOCATION:                                 FACILITY:   PHYSICIAN:  Charlett Blake, M.D.DATE OF BIRTH:  1950-12-09   DATE OF PROCEDURE:  06/18/2007  DATE OF DISCHARGE:                               OPERATIVE REPORT   PROCEDURE:  Right knee intra-articular injection.   INDICATION:  Osteoarthritis with knee pain only partially relieved by  medication management and other conservative care.  She has had very  good success with improvement of left knee pain, following injection  approximately 1 month ago.   DESCRIPTION OF PROCEDURE:  Informed consent was obtained after  describing the risks and benefits to the patient.  These include  bleeding, bruising, and infection.  She elects to proceed, and given  written consent.  Medial approach used right knee.  Area marked and  prepped with Betadine.  A 25-gauge 1-1/2-inch needle and a solution  containing 1 mL of 40 mg/mL Depo-Medrol and 4 mL of 1% lidocaine  injected.   The patient tolerated procedure well.  Pre-and-post injection  instructions given.  Return in 2 months.      Charlett Blake, M.D.  Electronically Signed     AEK/MEDQ  D:  06/18/2007 12:40:42  T:  06/19/2007 07:44:30  Job:  [id number]

## 2011-01-01 NOTE — Procedures (Signed)
[facility], [facility]               ACCOUNT NO.:  [id number]   MEDICAL RECORD NO.:  [id number]          PATIENT TYPE:  REC   LOCATION:  [facility]                          FACILITY:  [facility]   PHYSICIAN:  Charlett Blake, M.D.DATE OF BIRTH:  [birth year]   DATE OF PROCEDURE:  10/01/2007  DATE OF DISCHARGE:                               OPERATIVE REPORT   PROCEDURE:  Left L3-L4 transforaminal lumbar epidural steroid injection  under fluoroscopic guidance.   INDICATIONS:  Left L3 radiculitis only partially responsive to  medication management and other conservative care.   PROCEDURE IN DETAIL:  Informed consent was obtained after describing the  risks and benefits of the procedure to the patient.  These include  bleeding, bruising, infection, loss of bowel or bladder function,  temporary or permanent paralysis.  She elects to proceed and has given  written consent. The patient was placed prone on the fluoroscopy table.  Betadine prep, sterile drape.  A 25-gauge 1 1/2 inch needle was used to  anesthetize the skin and subcu tissue with 1% lidocaine x2 mL.  Then, a  22 gauge 3 1/2 inch spinal needle was inserted in the left L3-L4  intervertebral foramen under AP, lateral, and oblique imaging.  Omnipaque 180 x 1 mL under live fluoro demonstrated good epidural spread  as well as nerve root outline followed by injection of 1 mL of 40 mg/mL  Depo-Medrol and 2 mL of 1% MPF lidocaine.  The patient tolerated the  procedure well.  Pre and post injection vitals stable.  Post injection  instructions given.  Return in the one month.      Charlett Blake, M.D.  Electronically Signed     AEK/MEDQ  D:  10/01/2007 17:26:08  T:  10/03/2007 11:25:00  Job:  [id number]

## 2011-01-01 NOTE — Assessment & Plan Note (Signed)
Ms. Ruman returns today.  She has had a L3-4 transforaminal lumbar  epidural steroid injection under fluoroscopic guidance last month for  lumbar radiculitis.  She went from 8/10 to 5/10 when she got up the  table, but she states the pain came back in about a week's time.  She  said she has seen Dr. Ellene Route for neurosurgical consultation.  He did not  advise any surgery.  Dr. Wende Neighbors is taking care of her medications,  but she does not feel like the medicines she is on including fentanyl  patch 75 mcg and hydrocodone are helping her anymore.  She states she  has seen Dr. Louanne Skye for her knee pain in the past as well as her back.  She has also went to mental health who really did not provide any  ongoing treatment for her given that she is getting her medications  through her primary care physician for her report.   Her pain is about 8/10.  She has pain going down the right leg described  as sharp burning, stabbing, constant tingling, and aching.  She has  problems with dressing, bathing, meal prep, household duties on her  review of systems.  Her Oswestry score is 64%.  Her medical review of  systems numbness, tremor, tingling, trouble walking spasms, dizziness,  confusion, depression, anxiety,   Exam, blood pressure 149/88, pulse 84, respirations 18, O2 sat 100% on  room air.  Pain is 8/10.    Examination, she points to mainly the sacral area as her main pain area  palpated very lightly in this area.  She has very very bad performance  and she has some exaggerated pain responses.  Her low back range of  motion is 57, forward flexion 25% extension.  Lower extremity strength  is intact.   Gait, she favors right lower extremity, but is able to bear weight, and  has no evidence of toe drag or knee instability.   IMPRESSION:  Lumbar spinal stenosis with chronic radiculitis with a more  generalized chronic pain syndrome.  She may have a central sensitization  due to narcotic analgesics  versus a concomitant fibromyalgia syndrome.  Her pain does persist despite narcotic analgesic medications.  She gets  short-term relief from epidural injections and upon most recent  neurosurgical evaluation has not been felt to be a good surgical  candidate.   As I mentioned, the patient a multimodal approach is indicated combining  psychology with physical therapy with medications as well as perhaps  with injections.  I certainly do not think repeat injection at this time  is needed.  She did not want to pursue this option.  She states that  none of her doctors have ever helped her.  She did not wish to schedule  a followup appointment.  I will send a copy of this to Dr. Nevada Crane as well  as Dr. Ellene Route as well as Dr. Louanne Skye.   Should she reconsider, we would recommend her seeing Dr. Antionette Poles  from rehabilitation psychology over at [facility] on  [facility].      Charlett Blake, M.D.  Electronically Signed     AEK/MedQ  D:  10/13/2008 13:31:16  T:  10/14/2008 02:20:35  Job #:  [id number]

## 2011-01-01 NOTE — Procedures (Signed)
[facility], Regina Horton               ACCOUNT NO.:  [id number]   MEDICAL RECORD NO.:  [id number]          PATIENT TYPE:  REC   LOCATION:  [facility]                          FACILITY:  [facility]   PHYSICIAN:  Charlett Blake, M.D.DATE OF BIRTH:  [birth year]   DATE OF PROCEDURE:  04/07/2007  DATE OF DISCHARGE:                               OPERATIVE REPORT   PROCEDURE:  Left L3-4 transforaminal lumbar epidural steroid injection  under fluoroscopic guidance.   INDICATIONS:  Left L3 radiculopathy with knee pain and MRI images  concordant with complaints.   Pain is only partially responsive to medication management. She has had  very good relief with right-sided L3-4 interverbral foraminal injection  approximately one month ago.   Informed consent was obtained after describing risks and benefits of the  procedure to the patient. These include bleeding, bruising, infection,  loss of bowel and bladder function, temporary or permanent paralysis,  and she elects to proceed and has given consent.   The patient prone on fluoroscopy table, Betadine prep, sterile drape.  A  25-gauge, 1-1/2-inch needle was used to anesthetize skin and  subcutaneous tissues, and a 25-gauge, 3-1/2-inch spinal needle was  inserted under fluoroscopic guidance using AP, lateral and oblique  imaging. Once appropriate needle location was obtained using  fluoroscopic images, Omnipaque 180 demonstrated good nerve root outline  spreading into the epidural space, and a solution containing 1 mL of 10  mg/mL dexamethasone and 2 mL of 1% MPV lidocaine was injected. The  patient tolerated the procedure well. Preinjection pain level was 7/10.  Postinjection 0/10. The patient to return in 1 month for recheck and  possible knee injection.      Charlett Blake, M.D.  Electronically Signed     AEK/MEDQ  D:  04/07/2007 [id number]  T:  04/08/2007 08:50:04  Job:  [id number]

## 2011-01-01 NOTE — Procedures (Signed)
[facility], Regina Horton               ACCOUNT NO.:  [id number]   MEDICAL RECORD NO.:  [id number]          PATIENT TYPE:  REC   LOCATION:  [facility]                          FACILITY:  [facility]   PHYSICIAN:  Charlett Blake, M.D.DATE OF BIRTH:  [birth year]   DATE OF PROCEDURE:  DATE OF DISCHARGE:                               OPERATIVE REPORT   PROCEDURE:  Left intra-articular knee injection.   INDICATIONS:  Left knee pain with DJD. Informed consent was obtained  after describing the risks and benefits to the patient and she elected  to proceed.  These include bleeding, bruising, and infection and she  elects to proceed.   The area marked and prepped with Betadine, medial approach with a  1-1/2-  inch 25-gauge needle. 1 mL of 40 mg/mL Kenalog plus 4 mL of lidocaine  injected.  The patient tolerated the procedure well.  Post injection  instructions given.      Charlett Blake, M.D.  Electronically Signed     AEK/MEDQ  D:  05/21/2007 15:47:28  T:  05/22/2007 10:51:21  Job:  [id number]

## 2011-01-01 NOTE — Procedures (Signed)
[name], Regina Horton               ACCOUNT NO.:  [id number]   MEDICAL RECORD NO.:  [id number]          PATIENT TYPE:  REC   LOCATION:  [facility]                          FACILITY:  [facility]   PHYSICIAN:  Charlett Blake, M.D.DATE OF BIRTH:  22-Feb-1951   DATE OF PROCEDURE:  DATE OF DISCHARGE:                               OPERATIVE REPORT   PROCEDURE:  This is a left L3-4 transforaminal lumbar epidural steroid  injection.   INDICATION:  Left L3 radiculitis previously improved with transforaminal  injection last performed on February 04, 2008.  Her pain has returned again  and persists despite narcotic analgesic medications.   Informed consent was obtained after describing risks and benefits of the  procedure with the patient.  These include bleeding, bruising,  infection.  She elects to proceed and has given written consent.  The  patient placed prone on the fluoroscopy table.  Betadine prep, sterilely  draped.  A 25-gauge 1-1/2-inch needle was used to anesthetize the skin  and subcutaneous tissue, 1% lidocaine x2 mL, and then a 22-gauge 3-1/2-  inch spinal needle was inserted under fluoroscopic guidance.  AP,  lateral, and oblique imaging utilized.  Omnipaque 180 demonstrated good  epidural spread under live fluoro followed by injection 1 mL of 40 mg/mL  Depo-Medrol and 2 mL of 1% MPF lidocaine.  The patient tolerated the  procedure well.  Pre and post-injection vitals were stable.  I will see  her back in 1 month for knee injection.   In the meantime, I will send her to  physical therapy 2 times a week, 3-  4 weeks for muscle strengthening and stretching lower extremities as  well as aerobic conditioning, progress to home exercise program for  diagnosis of left L3 radiculitis, L4-L5 fusion, right knee OA and  fibromyalgia.      Charlett Blake, M.D.  Electronically Signed     AEK/MEDQ  D:  04/28/2008 13:43:51  T:  04/29/2008 02:52:00  Job:  [id number]

## 2011-01-01 NOTE — Assessment & Plan Note (Signed)
HISTORY OF PRESENT ILLNESS:  Follow up for back pain radiating to her  left lower extremity.  She has undergone a left L3-4 transforaminal  epidural steroid injection under fluoroscopic guidance in February 2009.  When I last saw her October 30, 2007, she had improvement in the left  lower extremity discomfort, and it was really just the knee joint that  was bothering her, and therefore, repeat knee joint injection has been  scheduled.  She comes back today stating that in fact she thinks that  the knee pain has been worse since the back injection.  However, I do  have documentations indicating the above.   She had some tingling and burning discomfort in her buttocks going into  the left knee and thigh area.  Pain is rated at an 8/10 level.  She has  some pain with walking.  She walks slowly with cane.  She also has knee  pain when she walks.  She has problems with meal prep, household duties,  shopping, bathing, dressing.  Her review of systems is also positive for  numbness, tremor, tingling left lower extremity.  Confusion, depression,  anxiety, suicidal thoughts, but further questioning indicates that these  are more passive in nature, where she can die.   Mood and affect is tearful.  Blood pressure is 119/57, pulse 60,  respirations 18, O2 saturation 98% on room air.  Obesity female, no  acute distress.  Orientation x3.  Affect is cheerful, labile, ambulates  with a cane.  She has some pain along the medial joint line of the left  knee.  Her coordination is normal.  Deep tendon reflexes are normal in  lower extremities.  Sensation normal.  She has normal strength in the  lower extremities.  Her back has some pain in the lumbosacral junction  paraspinal.   IMPRESSION:  Lumbar spinal stenosis.  She does have known L3-4 stenosis  with prolonged effect from her right L3-4 transforaminal done about 10  months ago.  The left one was done last February and before that in  October.  She has  only about a two month response after the last  injection, but would need to reinject based on her symptomatology at  this time.   This was discussed with the patient.  She elects to schedule this.  Will  do the left knee injection which should take care of more of her  localized knee joint discomfort.      Charlett Blake, M.D.  Electronically Signed     AEK/MedQ  D:  01/05/2008 14:45:38  T:  01/05/2008 15:08:34  Job #:  [id number]

## 2011-01-01 NOTE — Assessment & Plan Note (Signed)
The patient returns today for a followup office visit regarding low back  pain and lumbar radiculopathy, status post L4-L5 fusion.  She has MRI  findings showing disc herniation abutting the S1 nerve root, and  retrolisthesis, L3 on L4, causing stenosis at that level.  She did have  right L3 and right S1 transforaminal injections performed, March 10, 2007.  Since that time, she has really done quite well with her right  lower extremity radicular symptoms.  Because of some left-sided  symptoms, the left transforaminal lumbar epidural steroid injection was  performed at the L3-L4 level on the left side on April 07, 2007.  She  also got good relief with this.   Her main complaint right now is left knee pain.  She has pain with  walking although her walking time has improved since the injections.  She is doing more household work, meal prep.  Her average pain is about  a 6 out of 10, worsening with activity.  She still uses a cane, but not  as much anymore.  She has some numbness and tingling in the right leg,  confusion, anxiety, problems with constipation.   PHYSICAL EXAMINATION:  Blood pressure 120/62, pulse 73, respiratory rate  18, O2 sat 96% on room air.  Her back has no tenderness to palpation.  She has good forward flexion  but difficulty with extension.  She has crepitus of the left knee, no  evidence of fusion or induration or erythema.  Her lower extremity  strength is good.   IMPRESSION:  1. 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  All of these      are improved after epidural steroid injections, no need for spine      injections at this time.  2. Knee osteoarthritis, left side.  Will inject intraarticular.      Charlett Blake, M.D.  Electronically Signed     AEK/MedQ  D:  05/21/2007 15:50:53  T:  05/22/2007 10:33:00  Job #:  [id number]   cc:   Delphina Cahill, M.D.  Fax: [phone]

## 2011-01-01 NOTE — Assessment & Plan Note (Signed)
Patient returns today, office visit, regarding low back pain, lumbar  radiculopathy, history of L4-5 fusion.  Has MRI findings showing disk  herniation abutting the S1 nerve root, retrolisthesis, L3-4, causing  stenosis.  She had a right L3-4 transforaminal epidural steroid  injection and right S1 transforaminal epidural injection performed on  March 10, 2007.  She has really done well with the radicular symptoms  since that time.   She had a left transforaminal epidural steroid injection at L3-4 on  April 07, 2007.  Once again, this has relieved her back and left lower  extremity pain.  She had left knee pain the last visit, which was  improved with left knee intra-articular injection, and her main  complaint is right knee pain, which still worsens with activity.  She  has the most severe pain when she first gets up in the morning, but this  does continue during the day.  The pain is only partially responsive to  the hydrocodone.  Her pain level is about a 5/10.  Activity inhibition  is about 6-7/10.  Pain is worse with walking, sitting, standing, and  some other activities.  Pain improves with rest, heat, medication,  injections . She can walk three minutes at a time.  She climbs steps.  She does not drive.  She needs assistance with meal prep, household  duties, shopping, and dressing sometimes.  She has occasional depression  and anxiety, but she thinks it is a little bit better than it was about  a month ago.  She has some numbness and tingling in the toes of the left  foot.  Review of systems is also positive for constipation and nausea,  shortness of breath, and limb swelling.  She follows up with Dr. Delphina Cahill from internal medicine.   PHYSICAL EXAMINATION:  Blood pressure 137/58, pulse 82, respirations 18,  O2 sat 97% on room air.  GENERAL:  No acute distress.  Mood and affect appropriate.  BACK:  No tenderness to palpation.  She has good strength in the upper  and lower  extremities.  Good range of motion in the hips, knees, and  ankles.  She does have crepitus of the bilateral knees.  No evidence of  knee effusion or induration on either knee, but she has some tenderness  along the joint line on the right side.  Back has some tenderness to  palpation at the lumbosacral junction.  Forward flexion is about 25-30%.  Extension is about 25%.   IMPRESSION:  1. Lumbar radiculopathy with spinal listhesis, improved      symptomatically in terms of her lower extremity symptoms.  2. Knee osteoarthritis, right greater than left.  She has knee x-rays      showing some medial compartment narrowing bilaterally with some      spurring, patellofemoral and medial compartment, right knee, which      is very small.   PLAN:  1. Inject right knee today.  2. There is no need for repeat epidural injections.  3. See her back in three months.  4. Reduce hydrocodone to 5/500, given that, expect her knee pain to      further improve after injection on the right.      Charlett Blake, M.D.  Electronically Signed     AEK/MedQ  D:  06/18/2007 12:45:11  T:  06/18/2007 21:37:21  Job #:  [id number]   cc:   Delphina Cahill, M.D.  Fax: [phone]

## 2011-01-01 NOTE — Assessment & Plan Note (Signed)
Ms. Hameister follows up today, she is status post left L3-L4  transforaminal lumbar epidural steroid injections on February 04, 2008, for  a left L3 radiculitis with pain going down the left leg.  Her average  pain has reduced, it is around 7/10.  She is able to climb steps.  She  walks about 4 minutes at a time.  She does not drive.  She needs some  assistance with meal  prep, household duties and shopping, but despite  this, she feels like for the first time in at least 2 years that she is  up for taking a vacation at the [employer] and she is planning to go  with her husband next week.  Her sleep is fair.  Pain is worse with  walking and sitting.   REVIEW OF SYSTEMS:  Positive for confusion, anxiety, she has trouble  walking, spasms, dizziness, numbness, tremor, tingling, swelling,  diarrhea with alternating constipation, and weight gain.   SOCIAL HISTORY:  Nothing new compared to above.   PHYSICAL EXAMINATION:  VITAL SIGNS:  Blood pressure 136/45, pulse 84,  respirations 18, and O2 sat 99% on room air.  GENERAL:  In no acute distress.  Orientation x3.  Affect is alert.  Gait  is with limp, but is not using her cane at this point.  BACK:  Mild tenderness to palpation in the lumbosacral junction.  No  pain over the hips.  She has normal hip, knee, and ankle range of  motion.  Normal strength.  Normal deep tendon reflexes.   IMPRESSION:  1. L3 radiculitis, left, improved after epidural steroid injection.  2. Hip pain.  I think some of this is likely from osteoarthritis, does      not appear to have any trochanteric bursitis.   PLAN:  1. I have encouraged her to go to aquatic exercises at the [facility].  2. She will be getting her medications from Dr. Nevada Crane.  This includes      fentanyl patch as well as hydrocodone.  I will see her back in 2      more months for probable reinjection given that her prior duration      of the effects from epidural steroids is about 3 months.      Charlett Blake, M.D.  Electronically Signed     AEK/MedQ  D:  03/03/2008 16:32:16  T:  03/04/2008 [id number]  Job #:  [id number]   cc:   Jessy Oto, M.D.  Fax: [phone]
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
## 2011-01-01 NOTE — Procedures (Signed)
Regina Horton, Regina Horton               ACCOUNT NO.:  [id number]   MEDICAL RECORD NO.:  [id number]          PATIENT TYPE:  REC   LOCATION:  [facility]                          FACILITY:  [facility]   PHYSICIAN:  Charlett Blake, M.D.DATE OF BIRTH:  Oct 29, 1950   DATE OF PROCEDURE:  01/05/2008  DATE OF DISCHARGE:                               OPERATIVE REPORT   PROCEDURE:  Left knee injection.   INDICATION:  Left knee osteoarthritis and intra-articular joint  derangement.  She does show evidence of joint space narrowing which is  mild on the left side.  Has some meniscal signs on the left side.  She  has had good results with injections in the past and pain is only  partially responsive to medication management including narcotic  analgesic medications.   Informed consent today. risks and benefits of the procedure explained,  bleeding, bruising, infection and other possibilities.  She elects to  proceed and has given written consent.  Pain is interfering with self-  care and mobility.   The patient placed in a supine position.  Betadine prep x3 to the medial  knee on the left, followed by alcohol prep, followed by injection using  a 25-gauge inch and a half needle, 1 mL of 40 mg/mL Depo-Medrol and 4 mL  of 1% lidocaine injected after negative drawback for blood.  The patient  tolerated the procedure well.  Pre and post injection vitals stable.  Post injection instructions given.      Charlett Blake, M.D.  Electronically Signed     AEK/MEDQ  D:  01/05/2008 14:41:52  T:  01/05/2008 15:24:49  Job:  [id number]

## 2011-01-01 NOTE — Assessment & Plan Note (Signed)
HISTORY:  Ms. Regina Horton returns today.  I last saw her on June 18, 2007.  She states that she could not come back and missed her August 11, 2007  appointment due to transportation issues.  Her husband has had some  problems with back pain as well and has recently had an MRI and she has  had trouble getting around.   Of note is that Dr. Nevada Crane has been filling the patient's narcotic  analgesic medications as well as the Cymbalta.  Her Cymbalta dose has  been increase to 60 mg up from 30 mg.  Her hydrocodone dose has been  increased from 5/325 b.i.d. back up to 7.5/325 b.i.d.  She continues on  Neurontin.  She also states that she is on Fentanyl patches per Dr. Nevada Crane  at this point 75 mcg.   Her pain level is 8 out of 10 mainly in the back, left hip area, as well  as right greater than left knee.  The pain interferes with activity at  an 8 out of 10 level.  Pain is worse with walking, sitting, inactivity,  standing, and some other activities.  It improves with heat, rest,  medications, and injections.   She climbs steps slowly.  She does not drive.  She states that walking  is painful and uses a cane.   REVIEW OF SYSTEMS:  Positive for confusion, depression although she  states the depression is better since increasing the Cymbalta.  She also  has anxiety.  Numbness and tingling, weakness left leg.  She has  constipation, limb swelling, coughing, shortness of breath.  She states  that she has had some fevers, even up to 105 per her report.   She continues to see Dr. Claiborne Billings from cardiology as well as Dr. Lowanda Foster  from nephrology.   PHYSICAL EXAMINATION:  VITAL SIGNS:  Her blood pressure is 114/60, pulse  82, respirations 18, O2 sat 98% on room air.  GENERAL:  No acute distress, mood and affect labile, crying.  MUSCULOSKELETAL:  Her upper extremity strength is normal.  Upper  extremity range of motion is normal.  Lower extremity range of motion is  normal.  She has no knee effusion.   She does have some pain along the  left knee medial joint line and with end range range of motion/flexion.  Her back has tenderness to palpation lumbar paraspinals.  Forward  flexion is about 25%, extension is about 25%.   IMPRESSION:  1. History of lumbar radiculopathy.  2. History of spondylolisthesis.  3. She is status post L4-5 fusion.  4. She has stenosis at L3-4. She has had good results with left L3-4      transforaminal injections done on April 07, 2007.  5. She has had right L3 and right S1 transforaminal injections done on      March 10, 2007.   Given her increased symptomatology is on the left side, would schedule  her for repeat left L3 transforaminal lumbar epidural steroid injection.   In terms of her right knee, she has had good relief with knee joint  injections.  I believe that is part of her pain symptomatology as well.  We will re-inject today.   I will no longer prescribe narcotic analgesic medications given that it  is more convenient for her to see her primary physician on this one.  I  will continue to do her injections and I will see her next week for a  repeat epidural.  Charlett Blake, M.D.  Electronically Signed     AEK/MedQ  D:  09/25/2007 16:50:25  T:  09/27/2007 16:10:27  Job #:  [id number]   cc:   Delphina Cahill, M.D.  Fax: [phone]

## 2011-01-01 NOTE — Procedures (Signed)
NAMECHARLESZETTA, SCHOEFF               ACCOUNT NO.:  [id number]   MEDICAL RECORD NO.:  [id number]           PATIENT TYPE:   LOCATION:                                 FACILITY:   PHYSICIAN:  Charlett Blake, M.D.DATE OF BIRTH:  31-May-1951   DATE OF PROCEDURE:  DATE OF DISCHARGE:                               OPERATIVE REPORT   This is a right L3-4 transforaminal lumbar epidural steroid injection  under fluoroscopic guidance.   INDICATIONS:  Right L3 radiculitis, previously relieved with  transforaminal injection, last performed in July 2008.  I reviewed her  MRI.  She does have stenosis which could potentially impinge upon the L3  nerve root on either side.  Her left L3-4 transforaminal injection was  performed, April 29, 2008, and she has continued good relief in the  left lower extremity pain.  She has been doing some increased walking  activities and she has not been able to do these because of increased  right lower extremity pain.  Pain persist despite medication management.   Informed consent was obtained after describing risks and benefits of  this procedure with the patient.  These include bleeding, bruising,  infection.  She elects to proceed and has given written consent.  The  patient placed prone on fluoroscopy table.  Betadine prep, sterile  drape, 25-gauge inch and half needle was used to anesthetize the skin  and subcu tissue, 1% lidocaine x2 mL, then a 22-gauge 3.5-inch spinal  needle was inserted under fluoroscopic guidance.  AP, lateral, and  oblique imaging utilized.  Omnipaque 180 demonstrated good spread under  live fluoro followed by injection of 1 mL of 10 mg/mL dexamethasone, 1  mL of 1% MPF lidocaine.  The patient tolerated the procedure well, post  injection instructions given.  Pre and post injection vitals stable.  See her back in 3 months for repeat injection.      Charlett Blake, M.D.  Electronically Signed     AEK/MEDQ  D:  09/08/2008  15:24:41  T:  09/09/2008 04:18:52  Job:  [id number]

## 2011-01-01 NOTE — Procedures (Signed)
Regina Horton, Regina Horton               ACCOUNT NO.:  [id number]   MEDICAL RECORD NO.:  [id number]          PATIENT TYPE:  REC   LOCATION:  [facility]                          FACILITY:  [facility]   PHYSICIAN:  Charlett Blake, M.D.DATE OF BIRTH:  30-Nov-1950   DATE OF PROCEDURE:  09/25/2007  DATE OF DISCHARGE:                               OPERATIVE REPORT   PROCEDURE:  Left knee intra-articular injection.   INDICATIONS:  Left knee osteoarthritis with pain. She has had previous  good relief with right knee intra-articular injection last performed in  February 2008.   PROCEDURE IN DETAIL:  Informed consent was obtained after describing  risks and benefits of the procedure with the patient.  These include  bleeding, bruising, infection, temporary or permanent paralysis.  She  elects to proceed and has given written consent. The patient was placed  supine on the exam table.  Betadine prep x3, sterile drape.  Then, a 25  gauge 1 1/2 inch needle was inserted into the left knee joint medial  approach. After negative draw back of blood, 1 mL of 40 mg/mL Depo-  Medrol and 4 mL of 1% lidocaine were injected.  The patient tolerated  the procedure well.  Post injection instructions given.      Charlett Blake, M.D.  Electronically Signed     AEK/MEDQ  D:  09/25/2007 16:44:17  T:  09/27/2007 19:40:50  Job:  [id number]

## 2011-01-01 NOTE — Assessment & Plan Note (Signed)
HISTORY OF PRESENT ILLNESS:  Ms. Regina Horton returns today. She has undergone  a left L3-4 transforaminal lumbar epidural steroid injection under  fluoroscopic guidance. She has had improvement of the left lower  extremity radicular discomfort, to the point where it is really just her  knee joint pain that is bothering her. She has no right lower extremity  symptoms. Her back pain is relatively well controlled as well.   She has had no new medical complications in the interval time.   She rates her pain as 5 to 6 out of 10. Interferes with activity at a  moderate level. Her knee pain is, however, about an 8 out of 10 and  appears with walking, sitting, twisting on outstretched feet seems to  bother her as well.   She needs some assist with meal prep, household duties, shopping, and  getting in and out of the bathtub but otherwise, independent with  dressing, toileting, and other self-care.   REVIEW OF SYSTEMS:  Positive for numbness, tremor, tingling, trouble  walking, dizziness, confusion, depression and anxiety.   PHYSICAL EXAMINATION:  VITAL SIGNS:  Blood pressure 150/81, pulse 63, O2  sat 100% on room air.  GENERAL:  No acute distress.  BACK:  No tenderness to palpation. She has minimal medial joint line  tenderness over the knee and range pain with motion with flexion motion.  She has positive Apley's grind  test on the left side. Her lumbar range  of motion forward flexion is 25% and extension 25%.  NEUROLOGIC:  Deep tendon reflexes are 2+ bilateral biceps, triceps,  brachial radialis, patellar and Achilles. Motor strength 5/5 bilateral  hip flexors, knee extensors, ankle dorsiflexors. Gait is normal. She has  no evidence toe drag or knee instability. Mood and affect aer  appropriate.   IMPRESSION:  1. Left L3 radiculopathy with radiculitis, improved after L3-4      transforaminal injection on the left side performed October 01, 2007.  2. Left knee pain. X-rays done in  June 2008, demonstrate only mild      medial joint space narrowing. I suspect she may have some meniscal      pathology but has had no swelling or clicking.   PLAN:  Will plan to re-inject next month, followed up with some physical  therapy. Consider Synvisc injections.      Regina Horton, M.D.  Electronically Signed     AEK/MedQ  D:  10/30/2007 13:32:41  T:  10/30/2007 [id number]  Job #:  [id number]   cc:   Delphina Cahill, M.D.  Fax: [phone]

## 2011-01-01 NOTE — Procedures (Signed)
NAMEBRIGHID, Horton               ACCOUNT NO.:  [id number]   MEDICAL RECORD NO.:  [id number]           PATIENT TYPE:   LOCATION:                                 FACILITY:   PHYSICIAN:  Charlett Blake, M.D.DATE OF BIRTH:  03/19/1951   DATE OF PROCEDURE:  03/10/2007  DATE OF DISCHARGE:                               OPERATIVE REPORT   PROCEDURE:  Right L3 and right S1 transforaminal epidural steroid  injection under fluoroscopic guidance.   INDICATION:  Radicular pain following L4-5 fusion with right greater  than left knee pain as well as right greater than left foot pain. Last  MRI findings demonstrating disk herniation abutting S1 nerve root,  retrolisthesis L3 on L4 stenosis at that level with foraminal stenosis  at that level.   Informed consent was obtained after describing risks and benefits of the  procedure to the patient.  These include bleeding, bruising, infection,  loss of bowel bladder function, temporary or permanent paralysis.  She  elects to proceed and has given written consent.  The patient placed  prone on fluoroscopy table.  Betadine prep, sterile drape.  25 gauge  inch and a half needle was used to anesthetize skin and subcu tissue 1%  lidocaine x2 ccs.   The patient placed prone on fluoroscopy table.  Betadine prep, sterile  drape.  25 gauge inch and a half needle was used to anesthetize skin and  subcu tissue 1% lidocaine x2 ccs.  Then a 22-gauge 3-1/2 inch spinal  needle was inserted first at the right S1 foramen. AP lateral images  utilized.  Omnipaque 180 x 0.5 mL demonstrated good foraminal epidural  spread followed by injection of solution containing 1 mL of 40 mg per mL  Depo-Medrol and 1.5 mL 1% lidocaine.   Then with a separate entry point L3-4 foramen, intervertebral foramen  was entered using AP lateral oblique imaging using a 22-gauge 3-1/2 inch  spinal needle.  Omnipaque 180 x 0.5 mL demonstrated no intravascular  uptake, and nerve root  spread followed by injection of 1 mL of 40 mg per  cc Depo-Medrol and 1.5 mL of 1% lidocaine.  The patient tolerated  procedure well.  Pre injection pain level 6/10, post injection pain  level 3/10.  Specifically her right knee pain has improved.  She will  see me back 3-4 weeks for possible reinjection.      Charlett Blake, M.D.  Electronically Signed     AEK/MEDQ  D:  03/10/2007 16:31:29  T:  03/11/2007 09:28:11  Job:  [id number]   cc:   Delphina Cahill, M.D.  Fax: [phone]
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
## 2011-01-04 NOTE — Op Note (Signed)
NAME:  Regina Horton, Regina Horton                         ACCOUNT NO.:  [id number]   MEDICAL RECORD NO.:  [id number]                   PATIENT TYPE:  INP   LOCATION:  5010                                 FACILITY:  [facility]   PHYSICIAN:  Jessy Oto, M.D.                DATE OF BIRTH:  1951/07/06   DATE OF PROCEDURE:  02/28/2004  DATE OF DISCHARGE:                                 OPERATIVE REPORT   PREOPERATIVE DIAGNOSIS:  Painful lumbar degenerative spondylolisthesis L4-L5  with left sided L4-L5 nerve root entrapment.   POSTOPERATIVE DIAGNOSIS:  Painful lumbar degenerative spondylolisthesis L4-  L5 with left sided L4-L5 nerve root entrapment.   PROCEDURE:  Central decompressive laminectomy at the L4-L5 level with  excision of the spinous process and central portions of lamina of L4,  decompression of bilateral L4 and L5 nerve roots within their foramen, near  complete left L4-L5 facetectomy with left sided translaminar intervertebral  disc fusion using a 10 mm Leopard radiolucent cage and right iliac crest  bone graft harvesting through a separate fascial incision.  Posterolateral  fusion L4 to L5 utilizing a combination of local bone graft and Symphony  bone graft material.  Internal fixation posteriorly using Monarch pedicle  screws and rods, four 6.25 mm screws by 45 mm were used.   SURGEON:  Jessy Oto, M.D.   ASSISTANT:  Epimenio Foot, P.A.-C.   ANESTHESIA:  GOT, Dr. Sherren Kerns   ESTIMATED BLOOD LOSS:  300 mL.   DRAINS:  Hemovac x 1, Foley catheter to straight drain.   FLUIDS REPLACED:  The patient received two units of her autogenous blood  interoperatively.   PATHOLOGY:  A cyst removed from the left L4-L5 facet, this was sent for  pathology purposes.   DESCRIPTION OF PROCEDURE:  After adequate general anesthesia, the patient in  a prone position, chest rolls were used with the neck in slight flexion. All  pressure points were well padded.  The patient had a Foley  catheter placed  prior to turning to the prone position.  The patient underwent a standard  prep with DuraPrep solution from the lower dorsal spine to the S3 level and  over both iliac crest.  Draped in the usual manner, Ioban Vi-drape used.   An incision approximately 5-6 inches in length through the skin and  subcutaneous layers was carried out to the spinous process of L2, L3, L4,  and L5, at the lowest extent.  Through the skin and subcutaneous layers  using a 10 blade scalpel after infiltration of Marcaine 0.5% with 1:200,000  epinephrine.  The incision was then carried over the lateral aspects of the  spinous process of L3, L4, and L5, as well as L2.  Cobb was used to elevate  the paralumbar muscles at the L4 level and the L3 level as well as lightly  at the L2 level and over the posterior aspect  at L5 bilaterally.  Interoperative C-arm fluoroscopy was then used to ascertain the correct  level using a clamp over the spinous process of L4 to identify this level  interoperatively.  Image obtained for permanent documentation purposes.  The  clamp was removed and a Leksell rongeur then used to excise a portion of the  spinous process for continued identification of the L4 level for the  remainder of the procedure.  A Viper retractor was inserted and exposure  continued out over the facet at the L3-L4 level preserving the facet capsule  at this segment and exposing the transverse process of L4 deep and lateral  to the facet on both sides.  At the L4-L5 level, the facet capsule was  removed in its entirety on both sides and stripped and then the exposure  carried out over the tips of the transverse process of L5 bilaterally.  The  transversalis muscles were incised as well as the paralumbar muscles in  order to allow for exposure of the posterolateral gutters on both sides  packed with sponges in order to obtain hemostasis as well as cauterization  performed on the facet bleeders present.   When exposure was obtained out  laterally, then bone graft was harvested from the spinous process by  excising the central portion of the spinous process at L4, about 30% of the  upper aspect of L5 spinous process, and lower 30-40% of the spinous process  of L3.  Central portions and lamina were similarly resected using Leksell  rongeur and thinned appropriately.  3 mm and 4 mm Kerrison used to excise  the central portions of the lamina.  A half inch osteotome was then used to  osteotomize the medial aspect of the facet on the left side at the L4-L5  level resecting over 50% of the facet joint and then resecting the region  over the pars area medially pinning this area to about 50% of its normal  thickness.   A central laminectomy was carried up to the L3-L4 level where lateral recess  decompression was carried out over both L3 nerve roots and over both L4  nerve roots.  Foraminotomy performed over the L4 nerve roots right and left  side excising hypertrophic ligamentum flavum and apparent cyst material on  the left medial aspect of the L4-L5 facet.  Foraminotomy was performed over  the bilateral L5 nerve roots such that a hockey stick probe could be passed  out the L4 and L5 neural foramen without difficulty on both sides.  The  medial aspect of the facet was resected at the L4-L5 level to the level of  the pedicle at L5 and then over the superior aspect of the pedicle until the  nerve root was well decompressed such that it could be retracted superiorly  as well as the thecal sac retracted medially exposing the disc space easily  for insertion of a Leopard cage.  On the right side, central laminectomy was  also carried out preserving the pars area, performing medial facetectomy of  the L4-L5 facet on the right such that the pedicle of L5 was seen on its  medial aspect.  Foraminotomy was performed on the L5 nerve root decompressing this nicely.  Bone graft obtained from the central  laminectomy  was combined with allograft material and Symphony graft was obtained, at  least two logs worth.  This was obtained also using platelet rich plasma to  combine for the Symphony material.   At that time,  C-arm fluoroscopy was brought into the field and under C-arm  fluoroscopy, pedicle screws were first placed on the left side at the L4  level and then L5 using an awl to make an initial entry point into the  lateral aspect of the pedicle at the intersection of the superior articular  process of L4 and the transverse process of L4 laterally.  Convergence was  obtained and initially an awl was used for entry point, the handheld pedicle  finder then used to probe the pedicle appropriately on the left side.  The  depth of nearly 50 mm was encountered.  A 45 mm screw was used, this to  prevent any deep penetration of the vertebral body.  A 6.25 screw was chosen  and tapping was performed using 5.5 tap.  A Hall bur was then used to bur  the transverse process decorticating it.  Bone graft was then applied to the  decorticated transverse process in the inner transverse region and then the  pedicle screw inserted on the left side at the L4 level.  Similarly, this  was done at the left L5 level.  The soft tissue resistance using the Spine  Concepts soft tissue resistant instrumentation indicated that soft tissue  resistance was greater than 40 which is excellent inserted screw.  Additionally, C-arm fluoroscopy demonstrated the screws well in the pedicles  in the lateral view and AP view, as well.  A short rod was carefully placed  within the head fasteners to the screws after first breaking the fasteners  to allow for free mobility of the head on the screw.  The short rod was then  carefully rotated in the correct position and alignment and a cap was placed  free hand onto the fasteners of the screws at both the L4 and L5 on the left  side.   On the right side, similarly, pedicle  screws were inserted decorticating the  transverse processes following the initial entry into the pedicles,  measuring the appropriate depth, size, and 6.25 was used in both the L4 and  L5 level on the right side as well as the left side.  Note that a ball tip  probe was used to probe the channels following the initial pedicle finder  entry and this was following tapping indicating that no penetration  occurred.  Soft tissue resistance of all the screws was noted at 40 and  greater than 40.  Finally, a curved rod was placed into the screw heads on  the right side following their insertion which was done without difficulty  at the intersection of the supra-articular process of L4 with this L4  transverse process and at the intersection of the supra-articular process of  L5 and the L5 transverse process.  Note that decortication was performed  following tapping using a 5.5 tap and then the Symphony bone graft applied. The rods were then placed and screw caps were placed.  Following insertion  of the hardware, attention was turned to obtaining iliac crest bone graft  through a separate fascial incision.  The central incision was carefully  approximated with towel clips and a fascial incision was then made over the  right iliac crest tunneling beneath the skin on the right side to the iliac  crest laterally.  The incision was made and periosteal dissection was  carried lateral and medial to the crest.  The bleeders were controlled using  electrocautery.  The osteotome was then used to remove a small portion of  cortical cancellous bone from the superolateral aspect of the iliac crest  posteriorly at the posterior superior iliac spine.  An inner table  cancellous graft was then harvested using narrow gouges.  Enough bone graft  was harvested to allow for the Leopard cage packing as well as packing  within the disc space.  Once this was completed, then the iliac crest bone  graft harvest site was  then packed with thrombin soaked Gelfoam and sponge.  Central incision was then reopened and a Viper retractor replaced.   Attention was turned to the left side of the central laminectomy region  where careful exposure was obtained into the left neural foramen of L4  bringing up the L4 nerve root.  Bipolar electrocautery was used to control  epidural bleeders.  The thecal sac retracted medially and the L4 nerve root  retracted superiorly and a 15 blade scalpel was used to incise the disc at  the L4-L5 level on the left side excising a large window with pituitary  rongeurs.  A pituitary rongeur was then used to debride the disc space of  loose degenerative disc material.  Laminar spreader was then used spread  between the spinous process of L5 and of L3.  With this in place, then  dilatation of the disc space was performed using first an 8 mm and then 9 mm  and then 11 mm dilators.  An 11 mm dilator was able to be placed but it was  felt that a 10 mm cage would be best for insertion without significant  tension on the nerve roots.   This then completed, the patient then underwent preparation of the  intervertebral disc space using curets to incise the cartilaginous endplates  over the inferior aspect of L4 and superior aspect of L5.  Also, using ring  curets, removing the disc material as well as endplate material using  pituitary rongeurs, both straight and upbiting.  Once this was completed,  under loupe magnification, the endplates were easily seen and the bleeding  bone surface.  Cancellous bone graft that had been harvested from the right  iliac crest was then packed into the intervertebral disc space after a trial  using a 10 mm trial cage.  This showed excellent position and alignment of  the 10 mm cage with good distraction of the disc space.  Bone graft was then  placed into the intervertebral disc space following removal of the trial implant.  The cage was then impacted filled  with autogenous bone graft.  Bone graft had bene harvested from the iliac crest.  The cage then in place  with the laminar spreader in place, we then placed over the disc space  posteriorly and impacted into place.  The straight kicker was then used to  first advance the cage centrally.  Then, the curved kicker was used to  rotate the cage.  On lateral view the cage was noted to rotate as such that  the posterior marker was close to the posterior margin of the L4 vertebral  body so that the kicker was then introduced, placed through the central  porion of the cage, and used to impact the cage centrally into the disc  space into the concavity of the disc space locking it in place.  With this  then performed, irrigation was carried out.  Careful inspection of the L4  and L5 nerve roots demonstrated no compression present.  The rods were then  carefully positioned into the fasteners at  the L4 level and the rods then  torqued to 100 foot pounds using the anti-torque device to prevent rotation  of the fasteners.  Finally, compression obtained between the screw fastener  of L4 and L5 on the left side using the broad compressor.  Then, the lower  fastener was tightened to 100 foot pounds to the rod on this side obtaining  an excellent compression.  Similarly, this was done on the opposite right  side.  First, torquing to 100 foot pounds of the L4 fastener to the rod then  using compression across the L4-L5 screw and tightening the L5 fastener of  the rod to the screw, again, to 100 foot pounds.   This completed, irrigation was performed. Again, careful inspection of the  nerve roots demonstrated no difficulty with their exiting out the L4 and L5  neural foramen, fully decompressed.  Additional bone graft was then placed  over the posterolateral region on the left side and right side.  Irrigation  finally performed and thrombin soaked Gelfoam placed over the posterior  laminectomy defect.  On the  left side at L4-L5 a small nodule about 4-5 mm  was found and this was excised and sent for pathology as it was not  identifiable as definite synovial cyst.  With this, closure was performed of  the right iliac crest bone graft harvest site removing the old sponge,  hemostasis was excellent.  The fascial layer was closed with a running  stitch of #1 Vicryl.  The subcu layer was reapproximated wit interrupted #1  Vicryl sutures.  A medium Hemovac drain was placed in the depth of the  incision exiting over the right lower lumbar region.  The lumbar muscles  were reapproximated loosely over the laminotomy defect using interrupted #1  Vicryl sutures.  The lumbodorsal fascia was reapproximated in the midline  and to the supraspinous ligament with interrupted #1 Vicryl sutures.  The  deep subcu layers were reapproximated with interrupted #1 and 0 Vicryl  sutures, the more superficial layers with interrupted 2-0 Vicryl sutures, and the skin was closed with running subcu stitch of 4-0 Vicryl.  Tincture  of Benzoin and Steri-Strips were applied to the incision.  4 by 4s and ABD  pad was affixed to the skin with Hypofix tape.  The patient was then  returned to the supine position following obtaining permanent C-arm images,  these were obtained prior to closure, for documentation purposes.  The  patient was then returned to a supine position, reactivated, extubated, and  returned to the recovery room in satisfactory condition.  All instrument and  sponge counts were correct.                                               Jessy Oto, M.D.    JEN/MEDQ  D:  02/28/2004  T:  02/28/2004  Job:  [id number]
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
## 2011-01-04 NOTE — Op Note (Signed)
NAME:  Regina Horton, Regina Horton                         ACCOUNT NO.:  [id number]   MEDICAL RECORD NO.:  [id number]                   PATIENT TYPE:  AMB   LOCATION:  DAY                                  FACILITY:  [facility]   PHYSICIAN:  Hildred Laser, M.D.                 DATE OF BIRTH:  Feb 15, 1951   DATE OF PROCEDURE:  DATE OF DISCHARGE:                                 OPERATIVE REPORT   PROCEDURE:  Total colonoscopy.   ENDOSCOPIST:  Hildred Laser, M.D.   INDICATIONS:  Ceylin is a 60 year old Caucasian female with a 20-month's  history of change in her bowel habits in the way of constipation.  She is  also having intermittent rectal bleeding.  On a few occasions she has passed  a moderate amount of blood per rectum.  She is undergoing diagnostic  colonoscopy.  The procedure and risks were reviewed with the patient and  informed consent was obtained.   PREOPERATIVE MEDICATIONS:  Demerol 50 mg IV and Versed 6 mg IV in divided  dose.   FINDINGS:  Procedure performed in endoscopy suite.  The patient's vital  signs and O2 saturation were monitored during the procedure and remained  stable.  The patient was placed in the left lateral recumbent position and  rectal examination was performed.  She had prominent soft external  hemorrhoids.  A digital exam was normal.   Olympus videoscope was placed in the rectum and advanced under vision into  the sigmoid colon and beyond.  Scattered diverticula were noted at the  sigmoid and descending colon.  Preparation was satisfactory.  The scope was  advanced into cecum which was identified by ileocecal valve and appendiceal  orifice.  Pictures were taken for the record.  As the scope was withdrawn  the colonic mucosa was once again carefully examined and was normal  throughout.  Rectal mucosa similarly was normal.  The scope was retroflexed  to examine the anorectal junction and hemorrhoids were noted above and below  the dentate line.  Internal hemorrhoids  were larger than the external  hemorrhoids.  Endoscope was straightened and withdrawn.  The patient  tolerated the procedure well.   FINAL DIAGNOSES:  1. Internal and external hemorrhoids.  2. Left colonic diverticulosis.  3. Suspect her rectal bleeding has been secondary to hemorrhoids.   RECOMMENDATIONS:  She will continue high fiber diet and Citrucel as before.  She will also use Lactulose 2 tablespoonful q.h.s. or every other day.  She  will return for OV in 6 months unless she fails to respond to therapy.      ___________________________________________                                            Hildred Laser, M.D.   NR/MEDQ  D:  09/20/2003  T:  09/20/2003  Job:  [id number]

## 2011-02-01 ENCOUNTER — Other Ambulatory Visit: Payer: Self-pay | Admitting: Family Medicine

## 2011-02-02 LAB — BASIC METABOLIC PANEL
CO2: 25 mEq/L (ref 19–32)
Calcium: 10 mg/dL (ref 8.4–10.5)
Creat: 1.63 mg/dL — ABNORMAL HIGH (ref 0.50–1.10)
Glucose, Bld: 99 mg/dL (ref 70–99)

## 2011-02-02 LAB — LIPID PANEL: HDL: 54 mg/dL (ref >39)

## 2011-02-02 LAB — HEPATIC FUNCTION PANEL
AST: 21 U/L (ref 0–37)
Alkaline Phosphatase: 40 U/L (ref 39–117)
Bilirubin, Direct: 0.1 mg/dL (ref 0.0–0.3)
Indirect Bilirubin: 0.4 mg/dL (ref 0.0–0.9)
Total Bilirubin: 0.5 mg/dL (ref 0.3–1.2)

## 2011-02-05 ENCOUNTER — Encounter: Payer: Self-pay | Admitting: Family Medicine

## 2011-02-07 ENCOUNTER — Encounter: Payer: Self-pay | Admitting: Family Medicine

## 2011-02-07 ENCOUNTER — Ambulatory Visit (INDEPENDENT_AMBULATORY_CARE_PROVIDER_SITE_OTHER): Payer: Medicare Other | Admitting: Family Medicine

## 2011-02-07 DIAGNOSIS — R5381 Other malaise: Secondary | ICD-10-CM

## 2011-02-07 DIAGNOSIS — E559 Vitamin D deficiency, unspecified: Secondary | ICD-10-CM

## 2011-02-07 DIAGNOSIS — E785 Hyperlipidemia, unspecified: Secondary | ICD-10-CM

## 2011-02-07 DIAGNOSIS — Z23 Encounter for immunization: Secondary | ICD-10-CM

## 2011-02-07 DIAGNOSIS — K219 Gastro-esophageal reflux disease without esophagitis: Secondary | ICD-10-CM

## 2011-02-07 DIAGNOSIS — Z13820 Encounter for screening for osteoporosis: Secondary | ICD-10-CM

## 2011-02-07 DIAGNOSIS — F329 Major depressive disorder, single episode, unspecified: Secondary | ICD-10-CM

## 2011-02-07 DIAGNOSIS — M949 Disorder of cartilage, unspecified: Secondary | ICD-10-CM

## 2011-02-07 DIAGNOSIS — E039 Hypothyroidism, unspecified: Secondary | ICD-10-CM

## 2011-02-07 DIAGNOSIS — F3289 Other specified depressive episodes: Secondary | ICD-10-CM

## 2011-02-07 DIAGNOSIS — I10 Essential (primary) hypertension: Secondary | ICD-10-CM

## 2011-02-07 DIAGNOSIS — M899 Disorder of bone, unspecified: Secondary | ICD-10-CM

## 2011-02-07 DIAGNOSIS — Z139 Encounter for screening, unspecified: Secondary | ICD-10-CM

## 2011-02-07 MED ORDER — GABAPENTIN 800 MG PO TABS: 800.0000 mg | ORAL_TABLET | Freq: Two times a day (BID) | ORAL | Status: DC

## 2011-02-07 MED ORDER — PANTOPRAZOLE SODIUM 40 MG PO TBEC: 40.0000 mg | DELAYED_RELEASE_TABLET | Freq: Every day | ORAL | Status: DC

## 2011-02-07 NOTE — Patient Instructions (Signed)
F/Uin 4.5 months.  Your labs are good.  Pls cut back, on butter/oil substitutes, also trade in some potato/pasta  for the greens and other colors  You are being referred for a bone density scan  I will contact dr Loanne Drilling to see if he wants me to order the thyroid US before you see him in f/u , and let you know.  Pls call for your appt with Dr Loanne Drilling  Pls check on zostavax  Pneumovac today.  Vit D , lipid, chem 7 , hepatic , cbc  And TSH

## 2011-02-12 ENCOUNTER — Telehealth: Payer: Self-pay | Admitting: Family Medicine

## 2011-02-12 NOTE — Telephone Encounter (Signed)
Sallie to schedule NV appt

## 2011-02-14 ENCOUNTER — Encounter: Payer: Self-pay | Admitting: Family Medicine

## 2011-02-15 ENCOUNTER — Telehealth: Payer: Self-pay | Admitting: Family Medicine

## 2011-02-15 ENCOUNTER — Ambulatory Visit (INDEPENDENT_AMBULATORY_CARE_PROVIDER_SITE_OTHER): Payer: Medicare Other | Admitting: Family Medicine

## 2011-02-15 DIAGNOSIS — Z23 Encounter for immunization: Secondary | ICD-10-CM

## 2011-02-15 DIAGNOSIS — Z2911 Encounter for prophylactic immunotherapy for respiratory syncytial virus (RSV): Secondary | ICD-10-CM

## 2011-02-15 NOTE — Telephone Encounter (Signed)
pls schedule appt for osteoperosis, the test has been entered

## 2011-02-21 NOTE — Progress Notes (Signed)
  Subjective:    Patient ID: Regina Horton, female    DOB: April 07, 1951, 60 y.o.   MRN: [id number]  HPI The PT is here for follow up and re-evaluation of chronic medical conditions, medication management and review of recent lab and radiology data.  Preventive health is updated, specifically  Cancer screening, Osteoporosis screening and Immunization.   Questions or concerns regarding consultations or procedures which the PT has had in the interim are  addressed. The PT denies any adverse reactions to current medications since the last visit.  There are no new concerns.  There are no specific complaints She states she feels much better since her thyroid disease has been treated, and has an upcoming appt with endo Requests dexa       Review of Systems Denies recent fever or chills. Denies sinus pressure, nasal congestion, ear pain or sore throat. Denies chest congestion, productive cough or wheezing. Denies chest pains, palpitations, paroxysmal nocturnal dyspnea, orthopnea and leg swelling Denies abdominal pain, nausea, vomiting,diarrhea or constipation.  Denies rectal bleeding or change in bowel movement. Denies dysuria, frequency, hesitancy or incontinence. chronic joint pain,  and limitation in mobility. Denies headaches, seizure, numbness, or tingling. Denies uncontrolled  depression, anxiety or insomnia. Denies skin break down or rash.        Objective:   Physical Exam Patient alert and oriented and in no Cardiopulmonary distress.  HEENT: No facial asymmetry, EOMI, no sinus tenderness, TM's clear, Oropharynx pink and moist.  Neck supple no adenopathy.  Chest: Clear to auscultation bilaterally.  CVS: S1, S2 no murmurs, no S3.  ABD: Soft non tender. Bowel sounds normal.  Ext: No edema  [id number]  ROM spine, shoulders, hips and knees.  Skin: Intact, no ulcerations or rash noted.  Psych: Good eye contact, normal affect. Memory intact not anxious or depressed  appearing.  CNS: CN 2-12 intact, power, tone and sensation normal throughout.        Assessment & Plan:

## 2011-02-22 ENCOUNTER — Encounter: Payer: Self-pay | Admitting: Family Medicine

## 2011-02-22 NOTE — Assessment & Plan Note (Signed)
Controlled, no change in medication  

## 2011-02-22 NOTE — Assessment & Plan Note (Signed)
suboptimal control, no med change

## 2011-02-22 NOTE — Assessment & Plan Note (Signed)
Stable and controlled

## 2011-02-22 NOTE — Assessment & Plan Note (Signed)
Controlled, no change in medication Endocrine to f/u nodule

## 2011-02-22 NOTE — Assessment & Plan Note (Signed)
Pt to continue daily vit D

## 2011-02-25 ENCOUNTER — Ambulatory Visit (HOSPITAL_COMMUNITY)
Admission: RE | Admit: 2011-02-25 | Discharge: 2011-02-25 | Disposition: A | Discharge status: Home / Self Care | Payer: Medicare Other | Source: Ambulatory Visit | Attending: Family Medicine | Admitting: Family Medicine

## 2011-02-25 ENCOUNTER — Telehealth: Payer: Self-pay | Admitting: *Deleted

## 2011-02-25 DIAGNOSIS — Z13820 Encounter for screening for osteoporosis: Secondary | ICD-10-CM | POA: Insufficient documentation

## 2011-02-25 DIAGNOSIS — Z780 Asymptomatic menopausal state: Secondary | ICD-10-CM | POA: Insufficient documentation

## 2011-02-25 NOTE — Telephone Encounter (Signed)
Advised patient, likely a local reaction the vaccine, advised benadryl for itching and if rash worse go to urgent care or er. Patient agrees

## 2011-03-08 ENCOUNTER — Encounter: Payer: Self-pay | Admitting: Family Medicine

## 2011-05-13 ENCOUNTER — Other Ambulatory Visit: Payer: Self-pay | Admitting: Family Medicine

## 2011-05-17 ENCOUNTER — Other Ambulatory Visit: Payer: Self-pay | Admitting: Endocrinology

## 2011-07-04 ENCOUNTER — Other Ambulatory Visit: Payer: Self-pay | Admitting: Family Medicine

## 2011-07-09 ENCOUNTER — Ambulatory Visit: Payer: Medicare Other | Admitting: Family Medicine

## 2011-08-23 ENCOUNTER — Other Ambulatory Visit: Payer: Self-pay | Admitting: Family Medicine

## 2011-08-29 ENCOUNTER — Ambulatory Visit: Payer: Medicare Other | Admitting: Endocrinology

## 2011-09-02 ENCOUNTER — Other Ambulatory Visit: Payer: Self-pay | Admitting: Family Medicine

## 2011-09-05 ENCOUNTER — Other Ambulatory Visit: Payer: Self-pay | Admitting: Family Medicine

## 2011-09-09 ENCOUNTER — Other Ambulatory Visit: Payer: Self-pay | Admitting: Endocrinology

## 2011-10-11 DIAGNOSIS — G894 Chronic pain syndrome: Secondary | ICD-10-CM | POA: Diagnosis not present

## 2011-10-11 DIAGNOSIS — M961 Postlaminectomy syndrome, not elsewhere classified: Secondary | ICD-10-CM | POA: Diagnosis not present

## 2011-10-14 ENCOUNTER — Other Ambulatory Visit: Payer: Self-pay | Admitting: Family Medicine

## 2011-10-14 DIAGNOSIS — R5381 Other malaise: Secondary | ICD-10-CM | POA: Diagnosis not present

## 2011-10-14 DIAGNOSIS — E559 Vitamin D deficiency, unspecified: Secondary | ICD-10-CM | POA: Diagnosis not present

## 2011-10-14 DIAGNOSIS — E785 Hyperlipidemia, unspecified: Secondary | ICD-10-CM | POA: Diagnosis not present

## 2011-10-14 DIAGNOSIS — I10 Essential (primary) hypertension: Secondary | ICD-10-CM | POA: Diagnosis not present

## 2011-10-15 LAB — CBC WITH DIFFERENTIAL/PLATELET
Basophils Absolute: 0.1 10*3/uL (ref 0.0–0.1)
Eosinophils Absolute: 0.2 10*3/uL (ref 0.0–0.7)
Lymphocytes Relative: 34 % (ref 12–46)
Lymphs Abs: 2.6 10*3/uL (ref 0.7–4.0)
Neutrophils Relative %: 56 % (ref 43–77)
Platelets: 429 10*3/uL — ABNORMAL HIGH (ref 150–400)
RBC: 3.95 MIL/uL (ref 3.87–5.11)
RDW: 14 % (ref 11.5–15.5)
WBC: 7.5 10*3/uL (ref 4.0–10.5)

## 2011-10-15 LAB — LIPID PANEL
HDL: 54 mg/dL (ref >39)
Total CHOL/HDL Ratio: 3.1 Ratio
Triglycerides: 184 mg/dL — ABNORMAL HIGH (ref <150)

## 2011-10-15 LAB — HEPATIC FUNCTION PANEL
ALT: 9 U/L (ref 0–35)
AST: 21 U/L (ref 0–37)
Albumin: 4.2 g/dL (ref 3.5–5.2)
Alkaline Phosphatase: 43 U/L (ref 39–117)
Total Bilirubin: 0.5 mg/dL (ref 0.3–1.2)
Total Protein: 6.7 g/dL (ref 6.0–8.3)

## 2011-10-15 LAB — BASIC METABOLIC PANEL
CO2: 24 mEq/L (ref 19–32)
Calcium: 9.8 mg/dL (ref 8.4–10.5)
Chloride: 108 mEq/L (ref 96–112)
Creat: 1.44 mg/dL — ABNORMAL HIGH (ref 0.50–1.10)
Sodium: 143 mEq/L (ref 135–145)

## 2011-10-15 LAB — TSH: TSH: 4.297 u[IU]/mL (ref 0.350–4.500)

## 2011-10-22 ENCOUNTER — Ambulatory Visit (INDEPENDENT_AMBULATORY_CARE_PROVIDER_SITE_OTHER): Payer: Medicare Other | Admitting: Family Medicine

## 2011-10-22 ENCOUNTER — Encounter: Payer: Self-pay | Admitting: Family Medicine

## 2011-10-22 VITALS — BP 120/72 | HR 81 | Resp 18 | Ht 64.5 in | Wt 213.0 lb

## 2011-10-22 DIAGNOSIS — F329 Major depressive disorder, single episode, unspecified: Secondary | ICD-10-CM

## 2011-10-22 DIAGNOSIS — F3289 Other specified depressive episodes: Secondary | ICD-10-CM

## 2011-10-22 DIAGNOSIS — E785 Hyperlipidemia, unspecified: Secondary | ICD-10-CM | POA: Diagnosis not present

## 2011-10-22 DIAGNOSIS — K219 Gastro-esophageal reflux disease without esophagitis: Secondary | ICD-10-CM

## 2011-10-22 DIAGNOSIS — M545 Low back pain, unspecified: Secondary | ICD-10-CM

## 2011-10-22 DIAGNOSIS — R7301 Impaired fasting glucose: Secondary | ICD-10-CM | POA: Diagnosis not present

## 2011-10-22 DIAGNOSIS — I10 Essential (primary) hypertension: Secondary | ICD-10-CM | POA: Diagnosis not present

## 2011-10-22 DIAGNOSIS — E039 Hypothyroidism, unspecified: Secondary | ICD-10-CM

## 2011-10-22 MED ORDER — CHOLINE FENOFIBRATE 135 MG PO CPDR: 135.0000 mg | DELAYED_RELEASE_CAPSULE | Freq: Every day | ORAL | Status: DC

## 2011-10-22 NOTE — Patient Instructions (Signed)
F/u in 4.5 month  Weight loss goal is approx 10 pounds  A healthy diet is rich in fruit, vegetables and whole grains. Poultry fish, nuts and beans are a healthy choice for protein rather then red meat. A low sodium diet and drinking 64 ounces of water daily is generally recommended. Oils and sweet should be limited. Carbohydrates especially for those who are diabetic or overweight, should be limited to 30-45 gram per meal. It is important to eat on a regular schedule, at least 3 times daily. Snacks should be primarily fruits, vegetables or nuts.   Your thyroid function test is normal, no change in medication dose.   Cholesterol is excellent, but pls eat more dill and basil and less cheese and cookies  hBA1C today   i hope your back pain lessens, and I am available for help if you need me

## 2011-10-23 LAB — HEMOGLOBIN A1C: Mean Plasma Glucose: 100 mg/dL (ref <117)

## 2011-10-24 ENCOUNTER — Ambulatory Visit: Payer: Medicare Other | Admitting: Endocrinology

## 2011-10-30 DIAGNOSIS — I119 Hypertensive heart disease without heart failure: Secondary | ICD-10-CM | POA: Diagnosis not present

## 2011-10-30 DIAGNOSIS — E669 Obesity, unspecified: Secondary | ICD-10-CM | POA: Diagnosis not present

## 2011-10-30 DIAGNOSIS — E785 Hyperlipidemia, unspecified: Secondary | ICD-10-CM | POA: Diagnosis not present

## 2011-10-30 DIAGNOSIS — N289 Disorder of kidney and ureter, unspecified: Secondary | ICD-10-CM | POA: Diagnosis not present

## 2011-11-01 ENCOUNTER — Ambulatory Visit: Payer: Medicare Other | Admitting: Endocrinology

## 2011-11-01 DIAGNOSIS — Z0289 Encounter for other administrative examinations: Secondary | ICD-10-CM

## 2011-11-03 ENCOUNTER — Encounter: Payer: Self-pay | Admitting: Family Medicine

## 2011-11-03 NOTE — Assessment & Plan Note (Signed)
Increased depression and anxiety as quality of life is increasing challenged by pain and reduced fumnction

## 2011-11-03 NOTE — Assessment & Plan Note (Signed)
Controlled, no change in medication  

## 2011-11-03 NOTE — Progress Notes (Signed)
  Subjective:    Patient ID: Regina Horton, female    DOB: 1951/05/23, 61 y.o.   MRN: [id number]  HPI The PT is here for follow up and re-evaluation of chronic medical conditions, medication management and review of any available recent lab and radiology data.  Preventive health is updated, specifically  Cancer screening and Immunization.   Questions or concerns regarding consultations or procedures which the PT has had in the interim are  Addressed.She has anxiety aout access to pain management as she has been told by her current Doc that he may no longer prescribe this, i assure her that I will prescribe if necessary. She has had back surgery in the past , and is currently experiencing increased uncontrolled pain with lower extremity weakness and numbness  The PT denies any adverse reactions to current medications since the last visit.       Review of Systems See HPI Denies recent fever or chills. Denies sinus pressure, nasal congestion, ear pain or sore throat. Denies chest congestion, productive cough or wheezing. Denies chest pains, palpitations and leg swelling Denies abdominal pain, nausea, vomiting,diarrhea or constipation.   Denies dysuria, frequency, hesitancy or incontinence.  Denies headaches, seizure c/odepression, anxiety due primarily to increased debility and uncontrolled pain Denies skin break down or rash.        Objective:   Physical Exam Patient alert and oriented and in no cardiopulmonary distress.  HEENT: No facial asymmetry, EOMI, no sinus tenderness,  oropharynx pink and moist.  Neck supple no adenopathy.  Chest: Clear to auscultation bilaterally.  CVS: S1, S2 no murmurs, no S3.  ABD: Soft non tender. Bowel sounds normal.  Ext: No edema  MS: decreased ROM spine, shoulders, hips and knees.  Skin: Intact, no ulcerations or rash noted.  Psych: Good eye contact, normal affect. Memory intact anxious and tearful.  CNS: CN 2-12 intact, power, normal  throughout.        Assessment & Plan:

## 2011-11-03 NOTE — Assessment & Plan Note (Signed)
Triglycerides elevated , otherwise control is good, no med change, dietary advice given

## 2011-11-03 NOTE — Assessment & Plan Note (Signed)
Worsening, current pain management through pain clinic

## 2011-11-07 ENCOUNTER — Other Ambulatory Visit: Payer: Self-pay | Admitting: Endocrinology

## 2011-11-07 ENCOUNTER — Other Ambulatory Visit: Payer: Self-pay | Admitting: Family Medicine

## 2011-11-11 DIAGNOSIS — Z79899 Other long term (current) drug therapy: Secondary | ICD-10-CM | POA: Diagnosis not present

## 2011-11-11 DIAGNOSIS — N189 Chronic kidney disease, unspecified: Secondary | ICD-10-CM | POA: Diagnosis not present

## 2011-11-11 DIAGNOSIS — E559 Vitamin D deficiency, unspecified: Secondary | ICD-10-CM | POA: Diagnosis not present

## 2011-11-13 DIAGNOSIS — I10 Essential (primary) hypertension: Secondary | ICD-10-CM | POA: Diagnosis not present

## 2011-11-13 DIAGNOSIS — N183 Chronic kidney disease, stage 3 unspecified: Secondary | ICD-10-CM | POA: Diagnosis not present

## 2011-11-13 DIAGNOSIS — E559 Vitamin D deficiency, unspecified: Secondary | ICD-10-CM | POA: Diagnosis not present

## 2011-11-13 DIAGNOSIS — I509 Heart failure, unspecified: Secondary | ICD-10-CM | POA: Diagnosis not present

## 2011-12-03 ENCOUNTER — Ambulatory Visit (INDEPENDENT_AMBULATORY_CARE_PROVIDER_SITE_OTHER): Payer: Medicare Other | Admitting: Endocrinology

## 2011-12-03 ENCOUNTER — Encounter: Payer: Self-pay | Admitting: Endocrinology

## 2011-12-03 VITALS — BP 112/72 | HR 57 | Temp 97.9°F | Wt 216.0 lb

## 2011-12-03 DIAGNOSIS — E041 Nontoxic single thyroid nodule: Secondary | ICD-10-CM | POA: Diagnosis not present

## 2011-12-03 MED ORDER — LEVOTHYROXINE SODIUM 50 MCG PO TABS: 50.0000 ug | ORAL_TABLET | Freq: Every day | ORAL | Status: DC

## 2011-12-03 NOTE — Patient Instructions (Addendum)
Please continue the same thyroid medication. Let's recheck the thyroid ultrasound.  you will receive a phone call, about a day and time for an appointment.  blood tests are being requested for you today.  You will receive a letter with results.  Please return in 1 year.

## 2011-12-03 NOTE — Progress Notes (Signed)
Subjective:    Patient ID: Regina Horton, female    DOB: 11-Jul-1951, 61 y.o.   MRN: [id number]  HPI Pt returns for f/u of a small thyroid nodule (2011).  She was also found to have mild hypothyroidism in 2011.  She does not notice any lump in the neck.   Past Medical History  Diagnosis Date  . Allergic rhinitis   . Anxiety   . Depression   . GERD (gastroesophageal reflux disease)   . Hypertension   . Chronic back pain   . Chronic constipation   . Palpitations   . CKD (chronic kidney disease)   . Angina at rest   . Thyroid disease     Past Surgical History  Procedure Date  . Tonsillectomy childhood   . Back surgery 2005  . Tubal ligation 1973  . Breast lumpectomy 1970    right     History   Social History  . Marital Status: Married    Spouse Name: N/A    Number of Children: 2  . Years of Education: N/A   Occupational History  . disabled     Social History Main Topics  . Smoking status: Former Research scientist (life sciences)  . Smokeless tobacco: Not on file  . Alcohol Use: No  . Drug Use: No  . Sexually Active: Not on file   Other Topics Concern  . Not on file   Social History Narrative  . No narrative on file    Current Outpatient Prescriptions on File Prior to Visit  Medication Sig Dispense Refill  . aspirin (ASPIR-LOW) 81 MG EC tablet Take 81 mg by mouth daily.        Marland Kitchen atorvastatin (LIPITOR) 40 MG tablet Take 40 mg by mouth daily. One tab by mouth once daily       . Calcium-Vitamin D 600-125 MG-UNIT TABS Take by mouth daily. One to tablets by mouth once daily       . Casanthranol-Docusate Sodium 30-100 MG CAPS Take by mouth 2 (two) times daily. One cap by mouth two times a day         . Cholecalciferol (VITAMIN D) 1000 UNITS capsule Take 1,000 Units by mouth daily. Two tabs by mouth once daily         . Choline Fenofibrate 135 MG capsule Take 1 capsule (135 mg total) by mouth daily.  30 capsule  4  . CYMBALTA 60 MG capsule TAKE 1 CAPSULE ONCE DAILY  90 each  1  .  diltiazem (CARDIZEM CD) 180 MG 24 hr capsule       . fentaNYL (DURAGESIC - DOSED MCG/HR) 50 MCG/HR Place 1 patch onto the skin every other day. Change patch every 2 days        . fluticasone (FLONASE) 50 MCG/ACT nasal spray USE 2 SPRAYS IN EACH       NOSTRIL DAILY  48 g  1  . fluticasone (FLOVENT DISKUS) 50 MCG/BLIST diskus inhaler Inhale 1 puff into the lungs 2 (two) times daily. Two puffs each nostril daily       . gabapentin (NEURONTIN) 800 MG tablet Take 1 tablet (800 mg total) by mouth 2 (two) times daily. Take one tablet two times daily  180 tablet  3  . levocetirizine (XYZAL) 5 MG tablet TAKE 1 TABLET DAILY  90 tablet  1  . levothyroxine (SYNTHROID) 50 MCG tablet Take 1 tablet (50 mcg total) by mouth daily.  90 tablet  3  . LORazepam (ATIVAN) 1 MG  tablet Take 1 mg by mouth. One       . metoprolol (TOPROL-XL) 100 MG 24 hr tablet Take 100 mg by mouth daily.        . Multiple Vitamins-Iron (QC DAILY MULTIVITAMINS/IRON PO) Take by mouth. One tab by mouth once daily       . omega-3 acid ethyl esters (LOVAZA) 1 G capsule Take 2 g by mouth 2 (two) times daily. Two caps by mouth two times a day with meals       . oxyCODONE-acetaminophen (PERCOCET) 10-650 MG per tablet Take 1 tablet by mouth every 6 (six) hours as needed.      . pantoprazole (PROTONIX) 40 MG tablet Take 1 tablet (40 mg total) by mouth daily. One to two tablets by mouth once daily  90 tablet  3  . polyethylene glycol (MIRALAX) powder Take 17 g by mouth daily. 17 grams with water by mouth at bedtime as needed       . raloxifene (EVISTA) 60 MG tablet Take 60 mg by mouth daily. Take one tablet by mouth once daily       . sennosides-docusate sodium (SENOKOT-S) 8.6-50 MG tablet Take 1 tablet by mouth at bedtime. One tablet by mouth at bedtime       . torsemide (DEMADEX) 20 MG tablet Take 20 mg by mouth daily. One to two tablets by mouth once daily         No Active Allergies  Family History  Problem Relation Age of Onset  . Rheum  arthritis Mother   . Osteoporosis Mother   . Heart disease Father   . Diabetes Father   . Hyperlipidemia Father   . Cancer Father     bladder   . Arthritis Brother   . Heart disease Brother     BP 112/72  Pulse 57  Temp(Src) 97.9 F (36.6 C) (Oral)  Wt 216 lb (97.977 kg)  SpO2 96%  Review of Systems She reports low-back pain.      Objective:   Physical Exam VITAL SIGNS:  See vs page GENERAL: no distress NECK: There is no palpable thyroid enlargement.  No thyroid nodule is palpable.  No palpable lymphadenopathy at the anterior neck. Lab Results  Component Value Date   TSH 4.297 10/14/2011      Assessment & Plan:  Hypothyroidism, well-replaced Thyroid nodule.  Non-palpable

## 2011-12-11 ENCOUNTER — Other Ambulatory Visit: Payer: Self-pay | Admitting: Obstetrics and Gynecology

## 2011-12-11 DIAGNOSIS — Z139 Encounter for screening, unspecified: Secondary | ICD-10-CM

## 2011-12-23 ENCOUNTER — Encounter: Payer: Self-pay | Admitting: Endocrinology

## 2011-12-23 ENCOUNTER — Ambulatory Visit (HOSPITAL_COMMUNITY)
Admission: RE | Admit: 2011-12-23 | Discharge: 2011-12-23 | Disposition: A | Discharge status: Home / Self Care | Payer: Medicare Other | Source: Ambulatory Visit | Attending: Endocrinology | Admitting: Endocrinology

## 2011-12-23 ENCOUNTER — Ambulatory Visit (HOSPITAL_COMMUNITY)
Admission: RE | Admit: 2011-12-23 | Discharge: 2011-12-23 | Disposition: A | Discharge status: Home / Self Care | Payer: Medicare Other | Source: Ambulatory Visit | Attending: Obstetrics and Gynecology | Admitting: Obstetrics and Gynecology

## 2011-12-23 DIAGNOSIS — Z1231 Encounter for screening mammogram for malignant neoplasm of breast: Secondary | ICD-10-CM | POA: Insufficient documentation

## 2011-12-23 DIAGNOSIS — E041 Nontoxic single thyroid nodule: Secondary | ICD-10-CM

## 2011-12-23 DIAGNOSIS — E049 Nontoxic goiter, unspecified: Secondary | ICD-10-CM | POA: Diagnosis not present

## 2011-12-23 DIAGNOSIS — Z139 Encounter for screening, unspecified: Secondary | ICD-10-CM

## 2011-12-23 DIAGNOSIS — E042 Nontoxic multinodular goiter: Secondary | ICD-10-CM | POA: Diagnosis not present

## 2011-12-24 ENCOUNTER — Telehealth: Payer: Self-pay | Admitting: *Deleted

## 2011-12-24 NOTE — Telephone Encounter (Signed)
Pt informed of US results.

## 2011-12-24 NOTE — Telephone Encounter (Signed)
Called pt to inform of thyroid U/S results, left message for pt to callback office (letter also mailed to pt).

## 2012-01-09 DIAGNOSIS — M4310 Spondylolisthesis, site unspecified: Secondary | ICD-10-CM | POA: Diagnosis not present

## 2012-01-09 DIAGNOSIS — G894 Chronic pain syndrome: Secondary | ICD-10-CM | POA: Diagnosis not present

## 2012-01-20 ENCOUNTER — Other Ambulatory Visit: Payer: Self-pay | Admitting: Family Medicine

## 2012-01-29 ENCOUNTER — Other Ambulatory Visit: Payer: Self-pay

## 2012-01-29 MED ORDER — LEVOCETIRIZINE DIHYDROCHLORIDE 5 MG PO TABS: ORAL_TABLET | ORAL | Status: DC

## 2012-02-10 ENCOUNTER — Other Ambulatory Visit: Payer: Self-pay | Admitting: Family Medicine

## 2012-02-27 ENCOUNTER — Ambulatory Visit (INDEPENDENT_AMBULATORY_CARE_PROVIDER_SITE_OTHER): Payer: Medicare Other | Admitting: Family Medicine

## 2012-02-27 ENCOUNTER — Encounter: Payer: Self-pay | Admitting: Family Medicine

## 2012-02-27 VITALS — BP 136/74 | HR 104 | Resp 18 | Ht 64.5 in | Wt 209.1 lb

## 2012-02-27 DIAGNOSIS — I10 Essential (primary) hypertension: Secondary | ICD-10-CM

## 2012-02-27 DIAGNOSIS — F17201 Nicotine dependence, unspecified, in remission: Secondary | ICD-10-CM | POA: Insufficient documentation

## 2012-02-27 DIAGNOSIS — F17200 Nicotine dependence, unspecified, uncomplicated: Secondary | ICD-10-CM

## 2012-02-27 DIAGNOSIS — Z87891 Personal history of nicotine dependence: Secondary | ICD-10-CM | POA: Insufficient documentation

## 2012-02-27 DIAGNOSIS — E041 Nontoxic single thyroid nodule: Secondary | ICD-10-CM

## 2012-02-27 DIAGNOSIS — Z0000 Encounter for general adult medical examination without abnormal findings: Secondary | ICD-10-CM | POA: Diagnosis not present

## 2012-02-27 DIAGNOSIS — E785 Hyperlipidemia, unspecified: Secondary | ICD-10-CM

## 2012-02-27 MED ORDER — LORAZEPAM 1 MG PO TABS: 1.0000 mg | ORAL_TABLET | Freq: Two times a day (BID) | ORAL | Status: DC | PRN

## 2012-02-27 NOTE — Patient Instructions (Addendum)
F/u in December, call if you need me before It is important that you exercise regularly at least 30 minutes 5 times a week. If you develop chest pain, have severe difficulty breathing, or feel very tired, stop exercising immediately and seek medical attention   A healthy diet is rich in fruit, vegetables and whole grains. Poultry fish, nuts and beans are a healthy choice for protein rather then red meat. A low sodium diet and drinking 64 ounces of water daily is generally recommended. Oils and sweet should be limited. Carbohydrates especially for those who are diabetic or overweight, should be limited to 30-45 gram per meal. It is important to eat on a regular schedule, at least 3 times daily. Snacks should be primarily fruits, vegetables or nuts.    Fasting lipid and cmp in December  You are referred for a chest CT scan due to smoking history and chronic nicotine exposure You need to take one multivitamin daily and also calcium with D 1200mg /1000Iu once daily, and a baby aspirin 81mg  one daily

## 2012-03-01 ENCOUNTER — Encounter: Payer: Self-pay | Admitting: Family Medicine

## 2012-03-01 DIAGNOSIS — Z87891 Personal history of nicotine dependence: Secondary | ICD-10-CM | POA: Insufficient documentation

## 2012-03-01 DIAGNOSIS — Z0000 Encounter for general adult medical examination without abnormal findings: Secondary | ICD-10-CM | POA: Insufficient documentation

## 2012-03-01 NOTE — Progress Notes (Signed)
Subjective:    Patient ID: Regina Horton, female    DOB: April 05, 1951, 61 y.o.   MRN: [id number]  HPI    Review of Systems     Objective:   Physical Exam        Assessment & Plan:  Preventive Screening-Counseling & Management   Patient present here today for a Medicare annual wellness visit.   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 No  Over the past two weeks, have you felt little interest or pleasure in doing things? No  Have you lost interest or pleasure in daily life? No  Do you often feel hopeless? No  Do you cry easily over simple problems? No   Activities of Daily Living  In your present state of health, do you have any difficulty performing the following activities?  Driving?: yes, unable to due to severe osteoarthritis Managing money?: No Feeding yourself?:No Getting from bed to chair?:yes Climbing a flight of stairs?:No Preparing food and eating?:No Bathing or showering?:No Getting dressed?:No Getting to the toilet?:No Using the toilet?:No Moving around from place to place?: yes ambulates with a cane  Fall Risk Assessment In the past year have you fallen or had a near fall?:yes, near fall Are you currently taking any medications that make you dizziness?:yes   Hearing  Difficulties: No Do you often ask people to speak up or repeat themselves?:No Do you experience ringing or noises in your ears?:No Do you have difficulty understanding soft or whispered voices?:No  Cognitive Testing  Alert? Yes Normal Appearance?Yes  Oriented to person? Yes Place? Yes  Time? Yes  Displays appropriate judgment?Yes  Can read the correct time from a watch face? yes Are you having problems remembering things?No  Advanced Directives have been discussed with the patient?Yes    List the Names of Other Physician/Practitioners you currently use: entered   Indicate any recent Medical Services you may have received from other than [facility] providers in the past year (date may be approximate).   Assessment:    Annual Wellness Exam   Plan:    During the course of the visit the patient was educated and counseled about appropriate screening and preventive services including:  A healthy diet is rich in fruit, vegetables and whole grains. Poultry fish, nuts and beans are a healthy choice for protein rather then red meat. A low sodium diet and drinking 64 ounces of water daily is generally recommended. Oils and sweet should be limited. Carbohydrates especially for those who are diabetic or overweight, should be limited to 30-45 gram per meal. It is important to eat on a regular schedule, at least 3 times daily. Snacks should be primarily fruits, vegetables or nuts. It is important that you exercise regularly at least 30 minutes 5 times a week. If you develop chest pain,  have severe difficulty breathing, or feel very tired, stop exercising immediately and seek medical attention  Immunization reviewed and updated. Cancer screening reviewed and updated    Patient Instructions (the written plan) was given to the patient.  Medicare Attestation  I have personally reviewed:  The patient's medical and social history  Their use of alcohol, tobacco or illicit drugs  Their current medications  and supplements  The patient's functional ability including ADLs,fall risks, home safety risks, cognitive, and hearing and visual impairment  Diet and physical activities  Evidence for depression or mood disorders  The patient's weight, height, BMI, and visual acuity have been recorded in the chart. I have made referrals, counseling, and provided education to the patient based on review of the above and I have provided the patient with a written personalized care plan for preventive services.

## 2012-03-01 NOTE — Assessment & Plan Note (Signed)
Controlled, no change in medication  

## 2012-03-01 NOTE — Assessment & Plan Note (Signed)
Annual wellness done at this visit.  History updated and reviewed.  Major functional isssue is in mobility, however she is aware of this and ambulates with a cane

## 2012-03-01 NOTE — Assessment & Plan Note (Signed)
Followed by endo.  

## 2012-03-02 ENCOUNTER — Other Ambulatory Visit: Payer: Self-pay | Admitting: Family Medicine

## 2012-03-02 ENCOUNTER — Telehealth: Payer: Self-pay | Admitting: Family Medicine

## 2012-03-02 DIAGNOSIS — F17200 Nicotine dependence, unspecified, uncomplicated: Secondary | ICD-10-CM

## 2012-03-03 ENCOUNTER — Other Ambulatory Visit: Payer: Self-pay | Admitting: Family Medicine

## 2012-03-03 DIAGNOSIS — Z720 Tobacco use: Secondary | ICD-10-CM

## 2012-03-04 ENCOUNTER — Ambulatory Visit (HOSPITAL_COMMUNITY): Admission: RE | Admit: 2012-03-04 | Payer: Medicare Other | Source: Ambulatory Visit

## 2012-03-04 ENCOUNTER — Other Ambulatory Visit (HOSPITAL_COMMUNITY): Payer: Medicare Other

## 2012-03-10 NOTE — Telephone Encounter (Signed)
Rescheduled her appointment for 7.30.2013 be there at 10:45

## 2012-03-17 ENCOUNTER — Ambulatory Visit (HOSPITAL_COMMUNITY)
Admission: RE | Admit: 2012-03-17 | Discharge: 2012-03-17 | Disposition: A | Discharge status: Home / Self Care | Payer: Medicare Other | Source: Ambulatory Visit | Attending: Family Medicine | Admitting: Family Medicine

## 2012-03-17 DIAGNOSIS — J438 Other emphysema: Secondary | ICD-10-CM | POA: Diagnosis not present

## 2012-03-17 DIAGNOSIS — R05 Cough: Secondary | ICD-10-CM | POA: Insufficient documentation

## 2012-03-17 DIAGNOSIS — R059 Cough, unspecified: Secondary | ICD-10-CM | POA: Diagnosis not present

## 2012-03-17 DIAGNOSIS — F17200 Nicotine dependence, unspecified, uncomplicated: Secondary | ICD-10-CM | POA: Diagnosis not present

## 2012-03-17 DIAGNOSIS — Z720 Tobacco use: Secondary | ICD-10-CM

## 2012-03-30 ENCOUNTER — Emergency Department (HOSPITAL_COMMUNITY)
Admission: EM | Admit: 2012-03-30 | Discharge: 2012-03-30 | Disposition: A | Discharge status: Home / Self Care | Payer: Medicare Other | Attending: Emergency Medicine | Admitting: Emergency Medicine

## 2012-03-30 ENCOUNTER — Emergency Department (HOSPITAL_COMMUNITY): Payer: Medicare Other

## 2012-03-30 ENCOUNTER — Encounter (HOSPITAL_COMMUNITY): Payer: Self-pay | Admitting: *Deleted

## 2012-03-30 DIAGNOSIS — F411 Generalized anxiety disorder: Secondary | ICD-10-CM | POA: Insufficient documentation

## 2012-03-30 DIAGNOSIS — K219 Gastro-esophageal reflux disease without esophagitis: Secondary | ICD-10-CM | POA: Diagnosis not present

## 2012-03-30 DIAGNOSIS — F329 Major depressive disorder, single episode, unspecified: Secondary | ICD-10-CM | POA: Diagnosis not present

## 2012-03-30 DIAGNOSIS — N189 Chronic kidney disease, unspecified: Secondary | ICD-10-CM | POA: Insufficient documentation

## 2012-03-30 DIAGNOSIS — I129 Hypertensive chronic kidney disease with stage 1 through stage 4 chronic kidney disease, or unspecified chronic kidney disease: Secondary | ICD-10-CM | POA: Insufficient documentation

## 2012-03-30 DIAGNOSIS — F3289 Other specified depressive episodes: Secondary | ICD-10-CM | POA: Diagnosis not present

## 2012-03-30 DIAGNOSIS — Z79899 Other long term (current) drug therapy: Secondary | ICD-10-CM | POA: Insufficient documentation

## 2012-03-30 DIAGNOSIS — IMO0001 Reserved for inherently not codable concepts without codable children: Secondary | ICD-10-CM | POA: Insufficient documentation

## 2012-03-30 DIAGNOSIS — F419 Anxiety disorder, unspecified: Secondary | ICD-10-CM

## 2012-03-30 HISTORY — DX: Fibromyalgia: M79.7

## 2012-03-30 LAB — CBC WITH DIFFERENTIAL/PLATELET
Basophils Absolute: 0 10*3/uL (ref 0.0–0.1)
Basophils Relative: 0 % (ref 0–1)
Eosinophils Absolute: 0.1 10*3/uL (ref 0.0–0.7)
Eosinophils Relative: 1 % (ref 0–5)
HCT: 37.4 % (ref 36.0–46.0)
Hemoglobin: 13 g/dL (ref 12.0–15.0)
MCH: 31.6 pg (ref 26.0–34.0)
MCHC: 34.8 g/dL (ref 30.0–36.0)
MCV: 91 fL (ref 78.0–100.0)
Monocytes Absolute: 0.4 10*3/uL (ref 0.1–1.0)
Monocytes Relative: 7 % (ref 3–12)
Neutro Abs: 2.8 10*3/uL (ref 1.7–7.7)
RDW: 13.5 % (ref 11.5–15.5)

## 2012-03-30 LAB — URINALYSIS, ROUTINE W REFLEX MICROSCOPIC
Glucose, UA: NEGATIVE mg/dL
Hgb urine dipstick: NEGATIVE
Specific Gravity, Urine: 1.015 (ref 1.005–1.030)
pH: 5.5 (ref 5.0–8.0)

## 2012-03-30 LAB — COMPREHENSIVE METABOLIC PANEL
AST: 24 U/L (ref 0–37)
Albumin: 3.7 g/dL (ref 3.5–5.2)
BUN: 19 mg/dL (ref 6–23)
Calcium: 10.1 mg/dL (ref 8.4–10.5)
Chloride: 105 mEq/L (ref 96–112)
Creatinine, Ser: 1.65 mg/dL — ABNORMAL HIGH (ref 0.50–1.10)
GFR calc Af Amer: 37 mL/min — ABNORMAL LOW (ref >90)
Total Bilirubin: 0.5 mg/dL (ref 0.3–1.2)

## 2012-03-30 LAB — RAPID URINE DRUG SCREEN, HOSP PERFORMED
Amphetamines: NOT DETECTED
Cocaine: NOT DETECTED
Opiates: NOT DETECTED
Tetrahydrocannabinol: POSITIVE — AB

## 2012-03-30 LAB — LIPASE, BLOOD: Lipase: 27 U/L (ref 11–59)
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
## 2012-03-30 NOTE — ED Notes (Signed)
Pt signed a hard copy for d/c, unable to get portable computer to work for pt to sign

## 2012-03-30 NOTE — ED Notes (Signed)
MD at bedside. 
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
## 2012-04-02 DIAGNOSIS — M4310 Spondylolisthesis, site unspecified: Secondary | ICD-10-CM | POA: Diagnosis not present

## 2012-04-02 DIAGNOSIS — M1990 Unspecified osteoarthritis, unspecified site: Secondary | ICD-10-CM | POA: Diagnosis not present

## 2012-04-02 DIAGNOSIS — G894 Chronic pain syndrome: Secondary | ICD-10-CM | POA: Diagnosis not present

## 2012-04-16 ENCOUNTER — Other Ambulatory Visit: Payer: Self-pay | Admitting: Family Medicine

## 2012-04-16 DIAGNOSIS — R809 Proteinuria, unspecified: Secondary | ICD-10-CM | POA: Diagnosis not present

## 2012-04-16 DIAGNOSIS — E559 Vitamin D deficiency, unspecified: Secondary | ICD-10-CM | POA: Diagnosis not present

## 2012-04-16 DIAGNOSIS — D649 Anemia, unspecified: Secondary | ICD-10-CM | POA: Diagnosis not present

## 2012-04-16 DIAGNOSIS — I10 Essential (primary) hypertension: Secondary | ICD-10-CM | POA: Diagnosis not present

## 2012-04-16 DIAGNOSIS — N183 Chronic kidney disease, stage 3 unspecified: Secondary | ICD-10-CM | POA: Diagnosis not present

## 2012-04-22 DIAGNOSIS — E870 Hyperosmolality and hypernatremia: Secondary | ICD-10-CM | POA: Diagnosis not present

## 2012-04-22 DIAGNOSIS — E559 Vitamin D deficiency, unspecified: Secondary | ICD-10-CM | POA: Diagnosis not present

## 2012-04-22 DIAGNOSIS — N183 Chronic kidney disease, stage 3 unspecified: Secondary | ICD-10-CM | POA: Diagnosis not present

## 2012-04-23 DIAGNOSIS — M25569 Pain in unspecified knee: Secondary | ICD-10-CM | POA: Diagnosis not present

## 2012-06-14 ENCOUNTER — Other Ambulatory Visit: Payer: Self-pay | Admitting: Family Medicine

## 2012-06-23 ENCOUNTER — Other Ambulatory Visit: Payer: Self-pay | Admitting: Family Medicine

## 2012-06-24 ENCOUNTER — Other Ambulatory Visit: Payer: Self-pay

## 2012-06-24 MED ORDER — PANTOPRAZOLE SODIUM 40 MG PO TBEC: 40.0000 mg | DELAYED_RELEASE_TABLET | Freq: Every day | ORAL | Status: DC

## 2012-06-29 DIAGNOSIS — M1990 Unspecified osteoarthritis, unspecified site: Secondary | ICD-10-CM | POA: Diagnosis not present

## 2012-06-29 DIAGNOSIS — Z23 Encounter for immunization: Secondary | ICD-10-CM | POA: Diagnosis not present

## 2012-07-06 ENCOUNTER — Other Ambulatory Visit: Payer: Self-pay | Admitting: Family Medicine

## 2012-07-23 ENCOUNTER — Other Ambulatory Visit: Payer: Self-pay | Admitting: Family Medicine

## 2012-07-23 DIAGNOSIS — F17200 Nicotine dependence, unspecified, uncomplicated: Secondary | ICD-10-CM | POA: Diagnosis not present

## 2012-07-23 DIAGNOSIS — E785 Hyperlipidemia, unspecified: Secondary | ICD-10-CM | POA: Diagnosis not present

## 2012-07-24 LAB — COMPREHENSIVE METABOLIC PANEL
Albumin: 4.4 g/dL (ref 3.5–5.2)
Alkaline Phosphatase: 44 U/L (ref 39–117)
BUN: 26 mg/dL — ABNORMAL HIGH (ref 6–23)
Glucose, Bld: 86 mg/dL (ref 70–99)
Total Bilirubin: 0.5 mg/dL (ref 0.3–1.2)

## 2012-07-24 LAB — LIPID PANEL
Cholesterol: 139 mg/dL (ref 0–200)
HDL: 56 mg/dL (ref >39)
LDL Cholesterol: 54 mg/dL (ref 0–99)
Triglycerides: 147 mg/dL (ref <150)

## 2012-07-29 ENCOUNTER — Encounter: Payer: Self-pay | Admitting: Family Medicine

## 2012-07-29 ENCOUNTER — Ambulatory Visit (INDEPENDENT_AMBULATORY_CARE_PROVIDER_SITE_OTHER): Payer: Medicare Other | Admitting: Family Medicine

## 2012-07-29 VITALS — BP 130/78 | HR 75 | Resp 18 | Wt 212.1 lb

## 2012-07-29 DIAGNOSIS — Q782 Osteopetrosis: Secondary | ICD-10-CM

## 2012-07-29 DIAGNOSIS — F411 Generalized anxiety disorder: Secondary | ICD-10-CM | POA: Diagnosis not present

## 2012-07-29 DIAGNOSIS — R7302 Impaired glucose tolerance (oral): Secondary | ICD-10-CM | POA: Insufficient documentation

## 2012-07-29 DIAGNOSIS — E785 Hyperlipidemia, unspecified: Secondary | ICD-10-CM | POA: Diagnosis not present

## 2012-07-29 DIAGNOSIS — R7309 Other abnormal glucose: Secondary | ICD-10-CM

## 2012-07-29 DIAGNOSIS — J309 Allergic rhinitis, unspecified: Secondary | ICD-10-CM

## 2012-07-29 DIAGNOSIS — E039 Hypothyroidism, unspecified: Secondary | ICD-10-CM

## 2012-07-29 DIAGNOSIS — F3289 Other specified depressive episodes: Secondary | ICD-10-CM | POA: Diagnosis not present

## 2012-07-29 DIAGNOSIS — N189 Chronic kidney disease, unspecified: Secondary | ICD-10-CM

## 2012-07-29 DIAGNOSIS — F329 Major depressive disorder, single episode, unspecified: Secondary | ICD-10-CM

## 2012-07-29 MED ORDER — DULOXETINE HCL 30 MG PO CPEP: 30.0000 mg | ORAL_CAPSULE | Freq: Every day | ORAL | Status: DC

## 2012-07-29 MED ORDER — LORAZEPAM 1 MG PO TABS: ORAL_TABLET | ORAL | Status: DC

## 2012-07-29 NOTE — Patient Instructions (Addendum)
Annual wellness in 4 month, please call if you need me before  Labs have improved greatly, congrats  It is important that you exercise regularly at least 30 minutes 5 times a week. If you develop chest pain, have severe difficulty breathing, or feel very tired, stop exercising immediately and seek medical attention   A healthy diet is rich in fruit, vegetables and whole grains. Poultry fish, nuts and beans are a healthy choice for protein rather then red meat. A low sodium diet and drinking 64 ounces of water daily is generally recommended. Oils and sweet should be limited. Carbohydrates especially for those who are diabetic or overweight, should be limited to 30-45 gram per meal. It is important to eat on a regular schedule, at least 3 times daily. Snacks should be primarily fruits, vegetables or nuts.  Stop fenofibrate, continue lipitor and lovaza as before, also low fat diet.  Dose of ativan changed to 1 at bedtime only.  Cymbalta dose reduced to 30mg  daily, call if there are problems  Fasting lipid, cmp and EGFR and HBA1C in 4 month  Please log into "my chart" to follow labs and radiology reports

## 2012-07-29 NOTE — Progress Notes (Signed)
  Subjective:    Patient ID: Regina Horton, female    DOB: [birth year], 61 y.o.   MRN: [id number]  HPI The PT is here for follow up and re-evaluation of chronic medical conditions, medication management and review of any available recent lab and radiology data.  Preventive health is updated, specifically  Cancer screening and Immunization.   Questions or concerns regarding consultations or procedures which the PT has had in the interim are  addressed. The PT denies any adverse reactions to current medications since the last visit.  There are no new concerns.  There are no specific complaints       Review of Systems See HPI Denies recent fever or chills. Denies sinus pressure, nasal congestion, ear pain or sore throat. Denies chest congestion, productive cough or wheezing. Denies chest pains, palpitations and leg swelling Denies abdominal pain, nausea, vomiting,diarrhea or constipation.   Denies dysuria, frequency, hesitancy or incontinence. Denies uncontrolled  joint pain, swelling and limitation in mobility. Denies headaches, seizures, numbness, or tingling. Denies depression, anxiety or insomnia. Denies skin break down or rash.        Objective:   Physical Exam Patient alert and oriented and in no cardiopulmonary distress.  HEENT: No facial asymmetry, EOMI, no sinus tenderness,  oropharynx pink and moist.  Neck supple no adenopathy.  Chest: Clear to auscultation bilaterally.  CVS: S1, S2 no murmurs, no S3.  ABD: Soft non tender. Bowel sounds normal.  Ext: No edema   though reduced  ROM spine, shoulders, hips and knees.  Skin: Intact, no ulcerations or rash noted.  Psych: Good eye contact, normal affect. Memory intact not anxious or depressed appearing.  CNS: CN 2-12 intact, power, tone and sensation normal throughout.        Assessment & Plan:

## 2012-08-02 DIAGNOSIS — M810 Age-related osteoporosis without current pathological fracture: Secondary | ICD-10-CM | POA: Insufficient documentation

## 2012-08-02 NOTE — Assessment & Plan Note (Signed)
Hyperlipidemia:Low fat diet discussed and encouraged.  Controlled, no change in medication   

## 2012-08-02 NOTE — Assessment & Plan Note (Signed)
Controlled, no change in medication  

## 2012-08-02 NOTE — Assessment & Plan Note (Signed)
Followed by nephrology. 

## 2012-08-02 NOTE — Assessment & Plan Note (Signed)
Improved reduction in ativan

## 2012-08-02 NOTE — Assessment & Plan Note (Signed)
Continue daily evista and calcium supplement. Increased weight bearing exercise encouraged

## 2012-08-02 NOTE — Assessment & Plan Note (Signed)
Followed by endo.  

## 2012-08-02 NOTE — Assessment & Plan Note (Signed)
Improved reduction in cymbalta dose

## 2012-09-03 ENCOUNTER — Other Ambulatory Visit: Payer: Self-pay

## 2012-09-03 DIAGNOSIS — F329 Major depressive disorder, single episode, unspecified: Secondary | ICD-10-CM

## 2012-09-03 DIAGNOSIS — F3289 Other specified depressive episodes: Secondary | ICD-10-CM

## 2012-09-03 MED ORDER — LORAZEPAM 1 MG PO TABS: ORAL_TABLET | ORAL | Status: DC

## 2012-10-12 DIAGNOSIS — I10 Essential (primary) hypertension: Secondary | ICD-10-CM | POA: Diagnosis not present

## 2012-10-12 DIAGNOSIS — E559 Vitamin D deficiency, unspecified: Secondary | ICD-10-CM | POA: Diagnosis not present

## 2012-10-12 DIAGNOSIS — D649 Anemia, unspecified: Secondary | ICD-10-CM | POA: Diagnosis not present

## 2012-10-12 DIAGNOSIS — Z79899 Other long term (current) drug therapy: Secondary | ICD-10-CM | POA: Diagnosis not present

## 2012-10-12 DIAGNOSIS — N189 Chronic kidney disease, unspecified: Secondary | ICD-10-CM | POA: Diagnosis not present

## 2012-10-12 DIAGNOSIS — R809 Proteinuria, unspecified: Secondary | ICD-10-CM | POA: Diagnosis not present

## 2012-10-14 DIAGNOSIS — E559 Vitamin D deficiency, unspecified: Secondary | ICD-10-CM | POA: Diagnosis not present

## 2012-10-14 DIAGNOSIS — R809 Proteinuria, unspecified: Secondary | ICD-10-CM | POA: Diagnosis not present

## 2012-10-14 DIAGNOSIS — N183 Chronic kidney disease, stage 3 unspecified: Secondary | ICD-10-CM | POA: Diagnosis not present

## 2012-10-14 DIAGNOSIS — I10 Essential (primary) hypertension: Secondary | ICD-10-CM | POA: Diagnosis not present

## 2012-10-15 ENCOUNTER — Other Ambulatory Visit: Payer: Self-pay | Admitting: Endocrinology

## 2012-10-16 DIAGNOSIS — G894 Chronic pain syndrome: Secondary | ICD-10-CM | POA: Diagnosis not present

## 2012-10-16 DIAGNOSIS — Z79899 Other long term (current) drug therapy: Secondary | ICD-10-CM | POA: Diagnosis not present

## 2012-10-16 DIAGNOSIS — M545 Low back pain, unspecified: Secondary | ICD-10-CM | POA: Diagnosis not present

## 2012-10-16 DIAGNOSIS — M961 Postlaminectomy syndrome, not elsewhere classified: Secondary | ICD-10-CM | POA: Diagnosis not present

## 2012-10-24 ENCOUNTER — Other Ambulatory Visit: Payer: Self-pay | Admitting: Family Medicine

## 2012-10-28 DIAGNOSIS — M1710 Unilateral primary osteoarthritis, unspecified knee: Secondary | ICD-10-CM | POA: Diagnosis not present

## 2012-10-28 DIAGNOSIS — M961 Postlaminectomy syndrome, not elsewhere classified: Secondary | ICD-10-CM | POA: Diagnosis not present

## 2012-10-28 DIAGNOSIS — IMO0002 Reserved for concepts with insufficient information to code with codable children: Secondary | ICD-10-CM | POA: Diagnosis not present

## 2012-11-04 ENCOUNTER — Telehealth: Payer: Self-pay | Admitting: Family Medicine

## 2012-11-04 NOTE — Telephone Encounter (Signed)
Still had the rx available. Filled in the [facility] # and faxed back

## 2012-11-18 ENCOUNTER — Other Ambulatory Visit: Payer: Self-pay | Admitting: Family Medicine

## 2012-11-24 ENCOUNTER — Ambulatory Visit: Payer: Medicare Other | Admitting: Family Medicine

## 2012-11-30 ENCOUNTER — Telehealth: Payer: Self-pay | Admitting: Family Medicine

## 2012-11-30 ENCOUNTER — Ambulatory Visit: Payer: Medicare Other | Admitting: Family Medicine

## 2012-11-30 ENCOUNTER — Other Ambulatory Visit: Payer: Self-pay | Admitting: Family Medicine

## 2012-11-30 MED ORDER — DULOXETINE HCL 60 MG PO CPEP: 60.0000 mg | ORAL_CAPSULE | Freq: Every day | ORAL | Status: DC

## 2012-11-30 NOTE — Telephone Encounter (Signed)
Pt is requesting a 90 day supply of her meds with 1 year of refills, Pls do 90 days with 1 refill, she needs to come in at least twice per year. I will leave the note at your station. I have printed the cymbalta since the dose is changed. Pls note the change in pharmacy also Pls let her know when and what  done

## 2012-12-01 DIAGNOSIS — E669 Obesity, unspecified: Secondary | ICD-10-CM | POA: Diagnosis not present

## 2012-12-01 DIAGNOSIS — R609 Edema, unspecified: Secondary | ICD-10-CM | POA: Diagnosis not present

## 2012-12-01 DIAGNOSIS — E782 Mixed hyperlipidemia: Secondary | ICD-10-CM | POA: Diagnosis not present

## 2012-12-08 ENCOUNTER — Encounter: Payer: Self-pay | Admitting: Endocrinology

## 2012-12-08 ENCOUNTER — Ambulatory Visit (INDEPENDENT_AMBULATORY_CARE_PROVIDER_SITE_OTHER): Payer: Medicare Other | Admitting: Endocrinology

## 2012-12-08 VITALS — BP 122/80 | HR 69 | Wt 214.0 lb

## 2012-12-08 DIAGNOSIS — E039 Hypothyroidism, unspecified: Secondary | ICD-10-CM

## 2012-12-08 MED ORDER — LEVOTHYROXINE SODIUM 50 MCG PO TABS: 50.0000 ug | ORAL_TABLET | Freq: Every day | ORAL | Status: DC

## 2012-12-08 NOTE — Progress Notes (Signed)
Subjective:    Patient ID: Regina Horton, female    DOB: 05/20/1951, 62 y.o.   MRN: [id number]  HPI Pt returns for f/u of a small thyroid nodule and hypothyroidism (both dx'ed 2011).  She was also found to have mild hypothyroidism in 2011.  She does not notice any lump in the neck.   Past Medical History  Diagnosis Date  . Allergic rhinitis   . Anxiety   . Depression   . GERD (gastroesophageal reflux disease)   . Hypertension   . Chronic back pain   . Chronic constipation   . Palpitations   . Angina at rest   . Thyroid disease   . CKD (chronic kidney disease)   . Fibromyalgia     Past Surgical History  Procedure Laterality Date  . Tonsillectomy  childhood   . Back surgery  2005  . Tubal ligation  1973  . Breast lumpectomy  1970    right     History   Social History  . Marital Status: Married    Spouse Name: N/A    Number of Children: 2  . Years of Education: N/A   Occupational History  . disabled     Social History Main Topics  . Smoking status: Former Research scientist (life sciences)  . Smokeless tobacco: Not on file  . Alcohol Use: No  . Drug Use: Yes    Special: Marijuana  . Sexually Active: Yes    Birth Control/ Protection: Surgical   Other Topics Concern  . Not on file   Social History Narrative  . No narrative on file    Current Outpatient Prescriptions on File Prior to Visit  Medication Sig Dispense Refill  . aspirin (ASPIR-LOW) 81 MG EC tablet Take 81 mg by mouth daily.        Marland Kitchen atorvastatin (LIPITOR) 40 MG tablet Take 40 mg by mouth daily. One tab by mouth once daily       . Calcium-Vitamin D 600-125 MG-UNIT TABS Take by mouth daily. One to tablets by mouth once daily       . Casanthranol-Docusate Sodium 30-100 MG CAPS Take by mouth 2 (two) times daily. One cap by mouth two times a day         . Cholecalciferol (VITAMIN D) 1000 UNITS capsule Take 1,000 Units by mouth daily. Two tabs by mouth once daily         . diltiazem (CARDIZEM CD) 180 MG 24 hr capsule       .  DULoxetine (CYMBALTA) 60 MG capsule Take 1 capsule (60 mg total) by mouth daily.  90 capsule  1  . fentaNYL (DURAGESIC - DOSED MCG/HR) 50 MCG/HR Place 1 patch onto the skin every other day. Change patch every 2 days        . fluticasone (FLOVENT DISKUS) 50 MCG/BLIST diskus inhaler Inhale 1 puff into the lungs 2 (two) times daily. Two puffs each nostril daily       . metoprolol (TOPROL-XL) 100 MG 24 hr tablet Take 100 mg by mouth daily.        . Multiple Vitamins-Iron (QC DAILY MULTIVITAMINS/IRON PO) Take by mouth. One tab by mouth once daily       . omega-3 acid ethyl esters (LOVAZA) 1 G capsule Take 2 g by mouth 2 (two) times daily. Two caps by mouth two times a day with meals       . oxyCODONE-acetaminophen (PERCOCET) 10-650 MG per tablet Take 1 tablet by mouth every 6 (  six) hours as needed.      . polyethylene glycol (MIRALAX) powder Take 17 g by mouth daily. 17 grams with water by mouth at bedtime as needed       . raloxifene (EVISTA) 60 MG tablet Take 60 mg by mouth daily. Take one tablet by mouth once daily       . sennosides-docusate sodium (SENOKOT-S) 8.6-50 MG tablet Take 1 tablet by mouth at bedtime. One tablet by mouth at bedtime       . torsemide (DEMADEX) 20 MG tablet Take 20 mg by mouth daily. One to two tablets by mouth once daily        No current facility-administered medications on file prior to visit.    No Known Allergies  Family History  Problem Relation Age of Onset  . Rheum arthritis Mother   . Osteoporosis Mother   . Heart disease Father   . Diabetes Father   . Hyperlipidemia Father   . Cancer Father     bladder   . Arthritis Brother   . Heart disease Brother     BP 122/80  Pulse 69  Wt 214 lb (97.07 kg)  BMI 35.61 kg/m2  SpO2 96%  Review of Systems She has weight gain    Objective:   Physical Exam VITAL SIGNS:  See vs page GENERAL: no distress NECK: There is no palpable thyroid enlargement.  No thyroid nodule is palpable.  No palpable  lymphadenopathy at the anterior neck.       Assessment & Plan:  Hypothyroidism, on rx H/o small thyroid nodule

## 2012-12-08 NOTE — Patient Instructions (Addendum)
Please do the thyroid blood test along with your other blood test at dr simpson's office soon In my opinion, you can skip the ultrasound this year.  Please return in 1 year.

## 2012-12-09 ENCOUNTER — Other Ambulatory Visit: Payer: Self-pay

## 2012-12-09 DIAGNOSIS — F3289 Other specified depressive episodes: Secondary | ICD-10-CM

## 2012-12-09 DIAGNOSIS — F329 Major depressive disorder, single episode, unspecified: Secondary | ICD-10-CM

## 2012-12-09 MED ORDER — LEVOCETIRIZINE DIHYDROCHLORIDE 5 MG PO TABS: ORAL_TABLET | ORAL | Status: DC

## 2012-12-09 MED ORDER — FLUTICASONE PROPIONATE 50 MCG/ACT NA SUSP: NASAL | Status: DC

## 2012-12-09 MED ORDER — LORAZEPAM 1 MG PO TABS: ORAL_TABLET | ORAL | Status: DC

## 2012-12-09 MED ORDER — GABAPENTIN 800 MG PO TABS: ORAL_TABLET | ORAL | Status: DC

## 2012-12-09 MED ORDER — PANTOPRAZOLE SODIUM 40 MG PO TBEC: DELAYED_RELEASE_TABLET | ORAL | Status: DC

## 2012-12-09 NOTE — Telephone Encounter (Signed)
Printed all rx filled by this office with 90 day supply with 1 refill.  Left msg with husband to have patient to return call.

## 2012-12-25 ENCOUNTER — Other Ambulatory Visit: Payer: Self-pay | Admitting: Family Medicine

## 2012-12-25 DIAGNOSIS — E785 Hyperlipidemia, unspecified: Secondary | ICD-10-CM | POA: Diagnosis not present

## 2012-12-25 DIAGNOSIS — R7309 Other abnormal glucose: Secondary | ICD-10-CM | POA: Diagnosis not present

## 2012-12-26 LAB — COMPLETE METABOLIC PANEL WITH GFR
ALT: 11 U/L (ref 0–35)
AST: 22 U/L (ref 0–37)
Albumin: 4.2 g/dL (ref 3.5–5.2)
Alkaline Phosphatase: 45 U/L (ref 39–117)
GFR, Est Non African American: 38 mL/min — ABNORMAL LOW
Glucose, Bld: 103 mg/dL — ABNORMAL HIGH (ref 70–99)
Potassium: 4.5 mEq/L (ref 3.5–5.3)
Sodium: 138 mEq/L (ref 135–145)
Total Protein: 6.9 g/dL (ref 6.0–8.3)

## 2012-12-26 LAB — LIPID PANEL
LDL Cholesterol: 58 mg/dL (ref 0–99)
Total CHOL/HDL Ratio: 2.5 Ratio

## 2012-12-30 ENCOUNTER — Ambulatory Visit (INDEPENDENT_AMBULATORY_CARE_PROVIDER_SITE_OTHER): Payer: Medicare Other | Admitting: Family Medicine

## 2012-12-30 ENCOUNTER — Encounter: Payer: Self-pay | Admitting: Family Medicine

## 2012-12-30 VITALS — BP 130/74 | HR 70 | Resp 18 | Ht 64.5 in | Wt 214.0 lb

## 2012-12-30 DIAGNOSIS — M545 Low back pain, unspecified: Secondary | ICD-10-CM

## 2012-12-30 DIAGNOSIS — F411 Generalized anxiety disorder: Secondary | ICD-10-CM

## 2012-12-30 DIAGNOSIS — F329 Major depressive disorder, single episode, unspecified: Secondary | ICD-10-CM

## 2012-12-30 DIAGNOSIS — F3289 Other specified depressive episodes: Secondary | ICD-10-CM | POA: Diagnosis not present

## 2012-12-30 DIAGNOSIS — E039 Hypothyroidism, unspecified: Secondary | ICD-10-CM

## 2012-12-30 DIAGNOSIS — I10 Essential (primary) hypertension: Secondary | ICD-10-CM

## 2012-12-30 DIAGNOSIS — E785 Hyperlipidemia, unspecified: Secondary | ICD-10-CM

## 2012-12-30 DIAGNOSIS — J309 Allergic rhinitis, unspecified: Secondary | ICD-10-CM

## 2012-12-30 LAB — TSH: TSH: 2.925 u[IU]/mL (ref 0.350–4.500)

## 2012-12-30 NOTE — Progress Notes (Signed)
  Subjective:    Patient ID: Regina Horton, female    DOB: 1951-06-09, 62 y.o.   MRN: [id number]  HPI  The PT is here for follow up and re-evaluation of chronic medical conditions, medication management and review of any available recent lab and radiology data.  Preventive health is updated, specifically  Cancer screening and Immunization. Mammogram past due and pt states she will schedule  Questions or concerns regarding consultations or procedures which the PT has had in the interim are  Addressed.She is followed regularly by pain clinic, cardiology and endocrine. Denies any recent med changes The PT denies any adverse reactions to current medications since the last visit.  There are no new concerns. Hoping to enjoy the Summer, camping with her family There are no specific complaints      Review of Systems    See HPI Denies recent fever or chills. Denies sinus pressure, nasal congestion, ear pain or sore throat. Denies chest congestion, productive cough or wheezing. Denies chest pains, palpitations and leg swelling Denies abdominal pain, nausea, vomiting,diarrhea or constipation.   Denies dysuria, frequency, hesitancy or incontinence. Chronic  joint pain,  and limitation in mobility.Denies any recent falls, states mobility is worsening Denies headaches, seizures, numbness, or tingling. Denies uncontrolled  depression, anxiety or insomnia. Denies skin break down or rash.     Objective:   Physical Exam  Patient alert and oriented and in no cardiopulmonary distress.  HEENT: No facial asymmetry, EOMI, no sinus tenderness,  oropharynx pink and moist.  Neck decreased though adequate ROM no adenopathy.  Chest: Clear to auscultation bilaterally.  CVS: S1, S2 no murmurs, no S3.  ABD: Soft non tender. Bowel sounds normal.  Ext: No edema  MS: decreased  ROM spine, shoulders, hips and knees.  Skin: Intact, no ulcerations or rash noted.  Psych: Good eye contact, normal  affect. Memory intact mildly  Anxious and  depressed appearing.Easily tearful  CNS: CN 2-12 intact, power,  normal throughout.       Assessment & Plan:

## 2012-12-30 NOTE — Patient Instructions (Addendum)
F/U in October, please call if you need me before  Please bring all meds to that visit  Continue medications you are currently taking  Triglycerides are slightly elevated, please cut back on cheese, butter, red meat and fried foods.  We will send May 9 labs to Dr Claiborne Billings, and request his last office note  If necessary we will request repeat lab draw for the TSh and send to dr Loanne Drilling when available   Have a safe and enjoyable Summer  Please schedule your mammogram this is past due

## 2013-01-08 DIAGNOSIS — M545 Low back pain, unspecified: Secondary | ICD-10-CM | POA: Diagnosis not present

## 2013-01-08 DIAGNOSIS — M961 Postlaminectomy syndrome, not elsewhere classified: Secondary | ICD-10-CM | POA: Diagnosis not present

## 2013-01-08 DIAGNOSIS — G894 Chronic pain syndrome: Secondary | ICD-10-CM | POA: Diagnosis not present

## 2013-01-08 DIAGNOSIS — Z79899 Other long term (current) drug therapy: Secondary | ICD-10-CM | POA: Diagnosis not present

## 2013-01-25 NOTE — Assessment & Plan Note (Signed)
Controlled, no change in medication  

## 2013-01-25 NOTE — Assessment & Plan Note (Signed)
Controlled, no change in medication Will fwd result to endo who follows the pt

## 2013-01-25 NOTE — Assessment & Plan Note (Signed)
Elevated TG, reduce fat intake. No med change

## 2013-01-25 NOTE — Assessment & Plan Note (Signed)
Unchanged, pain management through pain clinic

## 2013-01-25 NOTE — Assessment & Plan Note (Signed)
Unchanged, continue current med, no interest in therapy at this time

## 2013-01-25 NOTE — Assessment & Plan Note (Signed)
Symptom flare in early Spring, now controlled

## 2013-03-26 ENCOUNTER — Other Ambulatory Visit: Payer: Self-pay | Admitting: Family Medicine

## 2013-04-01 DIAGNOSIS — G894 Chronic pain syndrome: Secondary | ICD-10-CM | POA: Diagnosis not present

## 2013-04-01 DIAGNOSIS — M48061 Spinal stenosis, lumbar region without neurogenic claudication: Secondary | ICD-10-CM | POA: Diagnosis not present

## 2013-04-01 DIAGNOSIS — M4310 Spondylolisthesis, site unspecified: Secondary | ICD-10-CM | POA: Diagnosis not present

## 2013-04-12 ENCOUNTER — Other Ambulatory Visit: Payer: Self-pay | Admitting: Family Medicine

## 2013-05-23 ENCOUNTER — Other Ambulatory Visit: Payer: Self-pay | Admitting: Family Medicine

## 2013-05-26 ENCOUNTER — Other Ambulatory Visit: Payer: Self-pay | Admitting: Family Medicine

## 2013-05-26 DIAGNOSIS — Z139 Encounter for screening, unspecified: Secondary | ICD-10-CM

## 2013-06-07 ENCOUNTER — Ambulatory Visit (HOSPITAL_COMMUNITY): Payer: Medicare Other

## 2013-06-09 ENCOUNTER — Ambulatory Visit: Payer: Medicare Other | Admitting: Family Medicine

## 2013-07-01 ENCOUNTER — Other Ambulatory Visit: Payer: Self-pay | Admitting: Family Medicine

## 2013-07-01 DIAGNOSIS — M545 Low back pain, unspecified: Secondary | ICD-10-CM | POA: Diagnosis not present

## 2013-07-01 DIAGNOSIS — G894 Chronic pain syndrome: Secondary | ICD-10-CM | POA: Diagnosis not present

## 2013-10-06 ENCOUNTER — Ambulatory Visit: Payer: Medicare Other | Admitting: Family Medicine

## 2013-12-08 ENCOUNTER — Encounter: Payer: Self-pay | Admitting: Endocrinology

## 2013-12-08 ENCOUNTER — Ambulatory Visit (INDEPENDENT_AMBULATORY_CARE_PROVIDER_SITE_OTHER): Payer: MEDICARE | Admitting: Endocrinology

## 2013-12-08 VITALS — BP 122/74 | HR 71 | Temp 97.7°F | Ht 64.5 in | Wt 215.0 lb

## 2013-12-08 DIAGNOSIS — Z0000 Encounter for general adult medical examination without abnormal findings: Secondary | ICD-10-CM

## 2013-12-08 DIAGNOSIS — E039 Hypothyroidism, unspecified: Secondary | ICD-10-CM

## 2013-12-08 DIAGNOSIS — E042 Nontoxic multinodular goiter: Secondary | ICD-10-CM

## 2013-12-08 LAB — TSH: TSH: 1.15 u[IU]/mL (ref 0.35–5.50)

## 2013-12-08 NOTE — Patient Instructions (Addendum)
blood tests are being requested for you today.  We'll contact you with results. Also, let's recheck the ultrasound.  you will receive a phone call, about a day and time for an appointment. Please return in 1 year.   Your hoarseness is much more likely related to the allergy than to the thyroid.  However, given the fact that you were formerly a smoker, please see Dr Moshe Cipro if it persists.

## 2013-12-08 NOTE — Progress Notes (Signed)
Subjective:    Patient ID: Regina Horton, female    DOB: 1951/07/29, 63 y.o.   MRN: [id number]  HPI Pt returns for f/u of a small multinodular goiter and hypothyroidism (both dx'ed 2011; last [place] in 2013 showed nodules were too small to need bx; she has been on synthroid since 2011).  She does not notice any lump in the neck.  She has 1 month of slight hoarseness sensation in the throat, but no assoc pain.   Past Medical History  Diagnosis Date  . Allergic rhinitis   . Anxiety   . Depression   . GERD (gastroesophageal reflux disease)   . Hypertension   . Chronic back pain   . Chronic constipation   . Palpitations   . Angina at rest   . Thyroid disease   . CKD (chronic kidney disease)   . Fibromyalgia     Past Surgical History  Procedure Laterality Date  . Tonsillectomy  childhood   . Back surgery  2005  . Tubal ligation  1973  . Breast lumpectomy  1970    right     History   Social History  . Marital Status: Married    Spouse Name: N/A    Number of Children: 2  . Years of Education: N/A   Occupational History  . disabled     Social History Main Topics  . Smoking status: Former Research scientist (life sciences)  . Smokeless tobacco: Not on file  . Alcohol Use: No  . Drug Use: Yes    Special: Marijuana  . Sexual Activity: Yes    Birth Control/ Protection: Surgical   Other Topics Concern  . Not on file   Social History Narrative  . No narrative on file    Current Outpatient Prescriptions on File Prior to Visit  Medication Sig Dispense Refill  . aspirin (ASPIR-LOW) 81 MG EC tablet Take 81 mg by mouth daily.        Marland Kitchen atorvastatin (LIPITOR) 40 MG tablet Take 40 mg by mouth daily. One tab by mouth once daily       . Calcium-Vitamin D 600-125 MG-UNIT TABS Take by mouth daily. One to tablets by mouth once daily       . Casanthranol-Docusate Sodium 30-100 MG CAPS Take by mouth 2 (two) times daily. One cap by mouth two times a day         . Cholecalciferol (VITAMIN D) 1000 UNITS capsule  Take 1,000 Units by mouth daily. Two tabs by mouth once daily         . CYMBALTA 60 MG capsule TAKE ONE CAPSULE BY MOUTH EVERY DAY  90 capsule  1  . diltiazem (CARDIZEM CD) 180 MG 24 hr capsule       . EVISTA 60 MG tablet TAKE 1 TABLET DAILY  90 tablet  0  . fentaNYL (DURAGESIC - DOSED MCG/HR) 50 MCG/HR Place 1 patch onto the skin every other day. Change patch every 2 days        . fluticasone (FLONASE) 50 MCG/ACT nasal spray USE 2 SPRAYS IN EACH       NOSTRIL DAILY  48 g  1  . fluticasone (FLOVENT DISKUS) 50 MCG/BLIST diskus inhaler Inhale 1 puff into the lungs 2 (two) times daily. Two puffs each nostril daily       . gabapentin (NEURONTIN) 800 MG tablet TAKE 1 TABLET TWICE A DAY  180 tablet  0  . levocetirizine (XYZAL) 5 MG tablet TAKE 1 TABLET BY  MOUTH EVERY DAY  90 tablet  1  . levothyroxine (SYNTHROID) 50 MCG tablet Take 1 tablet (50 mcg total) by mouth daily.  90 tablet  0  . LORazepam (ATIVAN) 1 MG tablet One tablet at bedtime. Dose reduction effective 07/29/2012  90 tablet  1  . metoprolol (TOPROL-XL) 100 MG 24 hr tablet Take 100 mg by mouth daily.        . Multiple Vitamins-Iron (QC DAILY MULTIVITAMINS/IRON PO) Take by mouth. One tab by mouth once daily       . omega-3 acid ethyl esters (LOVAZA) 1 G capsule Take 2 g by mouth 2 (two) times daily. Two caps by mouth two times a day with meals       . oxyCODONE-acetaminophen (PERCOCET) 10-650 MG per tablet Take 1 tablet by mouth every 6 (six) hours as needed.      . pantoprazole (PROTONIX) 40 MG tablet TAKE 1 TABLET DAILY  90 tablet  1  . polyethylene glycol (MIRALAX) powder Take 17 g by mouth daily. 17 grams with water by mouth at bedtime as needed       . sennosides-docusate sodium (SENOKOT-S) 8.6-50 MG tablet Take 1 tablet by mouth at bedtime. One tablet by mouth at bedtime       . torsemide (DEMADEX) 20 MG tablet Take 20 mg by mouth daily. One to two tablets by mouth once daily        No current facility-administered medications on  file prior to visit.    No Known Allergies  Family History  Problem Relation Age of Onset  . Rheum arthritis Mother   . Osteoporosis Mother   . Heart disease Father   . Diabetes Father   . Hyperlipidemia Father   . Cancer Father     bladder   . Arthritis Brother   . Heart disease Brother     BP 122/74  Pulse 71  Temp(Src) 97.7 F (36.5 C) (Oral)  Ht 5' 4.5" (1.638 m)  Wt 215 lb (97.523 kg)  BMI 36.35 kg/m2  SpO2 92%   Review of Systems She has nasal congestion.  She denies weight change.      Objective:   Physical Exam VITAL SIGNS:  See vs page GENERAL: no distress NECK: There is no palpable thyroid enlargement.  No thyroid nodule is palpable.  No palpable lymphadenopathy at the anterior neck.  Lab Results  Component Value Date   TSH 1.15 12/08/2013      Assessment & Plan:  Hypothyroidism: well-replaced Multinodular goiter: she is due for repeat US Hoarseness: prob related to allergic rhinitis.   Smoker: in this setting, she should see Dr Moshe Cipro to f/u this

## 2013-12-09 ENCOUNTER — Other Ambulatory Visit: Payer: MEDICARE

## 2013-12-19 ENCOUNTER — Other Ambulatory Visit: Payer: Self-pay | Admitting: Family Medicine

## 2014-01-21 ENCOUNTER — Telehealth: Payer: Self-pay | Admitting: Endocrinology

## 2014-01-21 ENCOUNTER — Telehealth: Payer: Self-pay | Admitting: Cardiovascular Disease

## 2014-01-21 ENCOUNTER — Other Ambulatory Visit: Payer: Self-pay | Admitting: Family Medicine

## 2014-01-21 DIAGNOSIS — Z1231 Encounter for screening mammogram for malignant neoplasm of breast: Secondary | ICD-10-CM

## 2014-01-21 MED ORDER — LEVOTHYROXINE SODIUM 50 MCG PO TABS: 50.0000 ug | ORAL_TABLET | Freq: Every day | ORAL | Status: DC

## 2014-01-21 NOTE — Telephone Encounter (Signed)
Pt is changing pharmacy,she needs her medicine called in. Please call her Lovaza 1 mg #180 to [phone].

## 2014-01-21 NOTE — Telephone Encounter (Signed)
Patient states that she needs her levothyroxine called in   Thank You   Pharm: [facility] [phone]

## 2014-01-21 NOTE — Telephone Encounter (Addendum)
Rx sent to pharmacy   

## 2014-01-24 MED ORDER — OMEGA-3-ACID ETHYL ESTERS 1 G PO CAPS: 2.0000 g | ORAL_CAPSULE | Freq: Two times a day (BID) | ORAL | Status: DC

## 2014-01-24 NOTE — Telephone Encounter (Signed)
Rx was sent to pharmacy electronically. 

## 2014-01-24 NOTE — Telephone Encounter (Signed)
Rx for synthroid sent on 01/21/2014.

## 2014-01-24 NOTE — Telephone Encounter (Signed)
Please refill synthroid x 1 Ov is due

## 2014-01-27 ENCOUNTER — Ambulatory Visit (HOSPITAL_COMMUNITY)
Admission: RE | Admit: 2014-01-27 | Discharge: 2014-01-27 | Disposition: A | Discharge status: Home / Self Care | Payer: MEDICARE | Source: Ambulatory Visit | Attending: Endocrinology | Admitting: Endocrinology

## 2014-01-27 ENCOUNTER — Ambulatory Visit (HOSPITAL_COMMUNITY)
Admission: RE | Admit: 2014-01-27 | Discharge: 2014-01-27 | Disposition: A | Discharge status: Home / Self Care | Payer: MEDICARE | Source: Ambulatory Visit | Attending: Family Medicine | Admitting: Family Medicine

## 2014-01-27 DIAGNOSIS — Z1231 Encounter for screening mammogram for malignant neoplasm of breast: Secondary | ICD-10-CM | POA: Insufficient documentation

## 2014-01-27 DIAGNOSIS — E041 Nontoxic single thyroid nodule: Secondary | ICD-10-CM | POA: Insufficient documentation

## 2014-01-27 DIAGNOSIS — I10 Essential (primary) hypertension: Secondary | ICD-10-CM | POA: Insufficient documentation

## 2014-01-27 DIAGNOSIS — E042 Nontoxic multinodular goiter: Secondary | ICD-10-CM

## 2014-02-10 ENCOUNTER — Other Ambulatory Visit: Payer: Self-pay | Admitting: *Deleted

## 2014-02-10 MED ORDER — FENOFIBRIC ACID 135 MG PO CPDR: 135.0000 mg | DELAYED_RELEASE_CAPSULE | Freq: Every day | ORAL | Status: DC

## 2014-02-10 MED ORDER — PANTOPRAZOLE SODIUM 40 MG PO TBEC: DELAYED_RELEASE_TABLET | ORAL | Status: DC

## 2014-02-10 MED ORDER — DILTIAZEM HCL ER COATED BEADS 180 MG PO CP24: 180.0000 mg | ORAL_CAPSULE | Freq: Every day | ORAL | Status: DC

## 2014-02-10 MED ORDER — METOPROLOL SUCCINATE ER 100 MG PO TB24: 100.0000 mg | ORAL_TABLET | Freq: Every day | ORAL | Status: DC

## 2014-02-10 MED ORDER — ATORVASTATIN CALCIUM 40 MG PO TABS: 40.0000 mg | ORAL_TABLET | Freq: Every day | ORAL | Status: DC

## 2014-02-10 MED ORDER — TORSEMIDE 20 MG PO TABS: 20.0000 mg | ORAL_TABLET | Freq: Every day | ORAL | Status: DC

## 2014-02-10 NOTE — Telephone Encounter (Signed)
Refills done needs to keep appt before future refills.

## 2014-02-15 DIAGNOSIS — Z79899 Other long term (current) drug therapy: Secondary | ICD-10-CM | POA: Diagnosis not present

## 2014-02-15 DIAGNOSIS — G894 Chronic pain syndrome: Secondary | ICD-10-CM | POA: Diagnosis not present

## 2014-02-15 DIAGNOSIS — M545 Low back pain, unspecified: Secondary | ICD-10-CM | POA: Diagnosis not present

## 2014-02-15 DIAGNOSIS — M4310 Spondylolisthesis, site unspecified: Secondary | ICD-10-CM | POA: Diagnosis not present

## 2014-02-22 ENCOUNTER — Telehealth: Payer: Self-pay | Admitting: Endocrinology

## 2014-02-22 MED ORDER — LEVOTHYROXINE SODIUM 50 MCG PO TABS: 50.0000 ug | ORAL_TABLET | Freq: Every day | ORAL | Status: DC

## 2014-02-22 NOTE — Telephone Encounter (Signed)
Rx sent to pharmacy. Pt advised via voicemail.

## 2014-02-22 NOTE — Telephone Encounter (Signed)
Patient would like her Levothyroxine called in 59mcg 90 day supply  [id number]  Thank you

## 2014-03-08 ENCOUNTER — Ambulatory Visit (INDEPENDENT_AMBULATORY_CARE_PROVIDER_SITE_OTHER): Payer: Medicare Other | Admitting: Family Medicine

## 2014-03-08 ENCOUNTER — Encounter (INDEPENDENT_AMBULATORY_CARE_PROVIDER_SITE_OTHER): Payer: Self-pay

## 2014-03-08 ENCOUNTER — Encounter: Payer: Self-pay | Admitting: Family Medicine

## 2014-03-08 VITALS — BP 116/68 | HR 86 | Resp 18 | Ht 64.0 in | Wt 211.0 lb

## 2014-03-08 DIAGNOSIS — R7309 Other abnormal glucose: Secondary | ICD-10-CM

## 2014-03-08 DIAGNOSIS — F32A Depression, unspecified: Secondary | ICD-10-CM

## 2014-03-08 DIAGNOSIS — K219 Gastro-esophageal reflux disease without esophagitis: Secondary | ICD-10-CM

## 2014-03-08 DIAGNOSIS — F419 Anxiety disorder, unspecified: Secondary | ICD-10-CM

## 2014-03-08 DIAGNOSIS — E039 Hypothyroidism, unspecified: Secondary | ICD-10-CM

## 2014-03-08 DIAGNOSIS — F3289 Other specified depressive episodes: Secondary | ICD-10-CM

## 2014-03-08 DIAGNOSIS — R7302 Impaired glucose tolerance (oral): Secondary | ICD-10-CM

## 2014-03-08 DIAGNOSIS — F341 Dysthymic disorder: Secondary | ICD-10-CM

## 2014-03-08 DIAGNOSIS — I10 Essential (primary) hypertension: Secondary | ICD-10-CM

## 2014-03-08 DIAGNOSIS — E785 Hyperlipidemia, unspecified: Secondary | ICD-10-CM

## 2014-03-08 DIAGNOSIS — E049 Nontoxic goiter, unspecified: Secondary | ICD-10-CM

## 2014-03-08 DIAGNOSIS — F329 Major depressive disorder, single episode, unspecified: Secondary | ICD-10-CM

## 2014-03-08 DIAGNOSIS — M5430 Sciatica, unspecified side: Secondary | ICD-10-CM

## 2014-03-08 DIAGNOSIS — M5440 Lumbago with sciatica, unspecified side: Secondary | ICD-10-CM

## 2014-03-08 MED ORDER — LORAZEPAM 1 MG PO TABS: ORAL_TABLET | ORAL | Status: DC

## 2014-03-08 MED ORDER — RALOXIFENE HCL 60 MG PO TABS: ORAL_TABLET | ORAL | Status: DC

## 2014-03-08 NOTE — Progress Notes (Signed)
   Subjective:    Patient ID: Regina Horton, female    DOB: 01-23-1951, 63 y.o.   MRN: [id number]  HPI The PT is here for follow up and re-evaluation of chronic medical conditions, medication management and review of any available recent lab and radiology data.  Preventive health is updated, specifically  Cancer screening and Immunization.   Questions or concerns regarding consultations or procedures which the PT has had in the interim are  addressed. The PT denies any adverse reactions to current medications since the last visit.  Has main concern re difficulty in getting out to Docs appts, and requests ythat I consider writing her chronic pain meds, I will be in touch with her pain Doc prior to committing She is behind in labs and I have asked that she have these done asap       Review of Systems See HPI Denies recent fever or chills. Denies sinus pressure, nasal congestion, ear pain or sore throat. Denies chest congestion, productive cough or wheezing. Denies chest pains, palpitations and leg swelling Denies abdominal pain, nausea, vomiting,diarrhea or constipation.   Denies dysuria, frequency, hesitancy or incontinence. C/o chronic disabling  joint pain,  and limitation in mobility. Denies headaches, seizures, co/0 numbness,and r tingling. C/o  depression, anxiety or insomnia.Not suicidal or homicidal Denies skin break down or rash.        Objective:   Physical Exam BP 116/68 mmHg  Pulse 86  Resp 18  Ht 5\' 4"  (1.626 m)  Wt 211 lb (95.709 kg)  BMI 36.20 kg/m2  SpO2 93% Patient alert and oriented and in no cardiopulmonary distress.  HEENT: No facial asymmetry, EOMI,   oropharynx pink and moist.  Neck supple no JVD, no mass.  Chest: Clear to auscultation bilaterally.  CVS: S1, S2 no murmurs, no S3.Regular rate.  ABD: Soft non tender.   Ext: No edema  [id number]  ROM spine, shoulders, hips and knees.  Skin: Intact, no ulcerations or rash noted.  Psych:  Good eye contact, normal affect. Memory intact mildly  anxious and depressed appearing.  CNS: CN 2-12 intact, power,  normal throughout.no focal deficits noted.        Assessment & Plan:  Essential hypertension Controlled, no change in medication   GERD Controlled, no change in medication   Hypothyroidism Followed by endocrine who monitors her medication  IGT (impaired glucose tolerance) Patient educated about the importance of limiting  Carbohydrate intake , the need to commit to daily physical activity for a minimum of 30 minutes , and to commit weight loss. The fact that changes in all these areas will reduce or eliminate all together the development of diabetes is stressed.   Updated lab needed at/ before next visit.   Anxiety and depression Seems better controlled than when pt last seen. Continue current medication  Hyperlipidemia LDL goal <100 Updated lab needed , this is past due Hyperlipidemia:Low fat diet discussed and encouraged.    LOW BACK PAIN, CHRONIC Has been managed in [place] by pain clinic, pt requesting that due to increasing difficulty in transportation her care be referred here, will correspond directly with ehr pain specialist about thsi , she is maintained on very high doses of pain meds

## 2014-03-08 NOTE — Patient Instructions (Signed)
Annual wellness in 3 month, call if you need me before    Fasting lipid, cmp, hBA1C, TSH and vit D , cBC as soon as possible ( by the 3rd week in August)  I will send a note to Dr Nelva Bush explaining your current request and get his feedback  Glad  To see you today ,  I hope thing get better

## 2014-03-09 DIAGNOSIS — M5137 Other intervertebral disc degeneration, lumbosacral region: Secondary | ICD-10-CM | POA: Diagnosis not present

## 2014-03-09 DIAGNOSIS — G894 Chronic pain syndrome: Secondary | ICD-10-CM | POA: Diagnosis not present

## 2014-04-01 ENCOUNTER — Other Ambulatory Visit: Payer: Self-pay

## 2014-04-06 ENCOUNTER — Other Ambulatory Visit: Payer: Self-pay

## 2014-04-06 MED ORDER — OMEGA-3-ACID ETHYL ESTERS 1 G PO CAPS: 2.0000 g | ORAL_CAPSULE | Freq: Two times a day (BID) | ORAL | Status: DC

## 2014-04-06 MED ORDER — FLUTICASONE PROPIONATE 50 MCG/ACT NA SUSP: NASAL | Status: DC

## 2014-04-06 MED ORDER — GABAPENTIN 800 MG PO TABS: ORAL_TABLET | ORAL | Status: DC

## 2014-04-18 ENCOUNTER — Telehealth: Payer: Self-pay | Admitting: Cardiovascular Disease

## 2014-04-18 NOTE — Telephone Encounter (Signed)
Returned a call to [place], the [employer]. She called to inform us that there is a drug interaction between the fentanyl patch and the diltiazem  the patient is presently taking. She advises that the diltiazem enhances the effects of the fentanyl. I informed her that she will probably need to inform the Doctor that is prescribing the fentanyl patch to see if he can change her therapy since this is a recent start for her. Dr.Kelly has never seen this patient before. She was a former patient of Dr.Weintraub's and will have her first appointment with Dr. Claiborne Billings in September. The pharmacist agrees and will notify the patient and the prescribing MD of the medication interaction.

## 2014-04-18 NOTE — Telephone Encounter (Signed)
She wanted you to be aware of the interaction between her her Fenthonyl pain patches and her Diltiazem.eh

## 2014-04-28 ENCOUNTER — Telehealth: Payer: Self-pay | Admitting: *Deleted

## 2014-04-28 MED ORDER — DULOXETINE HCL 60 MG PO CPEP: ORAL_CAPSULE | ORAL | Status: DC

## 2014-04-28 MED ORDER — LEVOCETIRIZINE DIHYDROCHLORIDE 5 MG PO TABS: ORAL_TABLET | ORAL | Status: DC

## 2014-04-28 MED ORDER — LEVOTHYROXINE SODIUM 50 MCG PO TABS: 50.0000 ug | ORAL_TABLET | Freq: Every day | ORAL | Status: DC

## 2014-04-28 MED ORDER — OMEGA-3-ACID ETHYL ESTERS 1 G PO CAPS: 2.0000 g | ORAL_CAPSULE | Freq: Two times a day (BID) | ORAL | Status: DC

## 2014-04-28 MED ORDER — DILTIAZEM HCL ER COATED BEADS 180 MG PO CP24: 180.0000 mg | ORAL_CAPSULE | Freq: Every day | ORAL | Status: DC

## 2014-04-28 MED ORDER — GABAPENTIN 800 MG PO TABS: ORAL_TABLET | ORAL | Status: DC

## 2014-04-28 MED ORDER — FENOFIBRIC ACID 135 MG PO CPDR: 135.0000 mg | DELAYED_RELEASE_CAPSULE | Freq: Every day | ORAL | Status: DC

## 2014-04-28 MED ORDER — RALOXIFENE HCL 60 MG PO TABS: ORAL_TABLET | ORAL | Status: DC

## 2014-04-28 MED ORDER — ATORVASTATIN CALCIUM 40 MG PO TABS: 40.0000 mg | ORAL_TABLET | Freq: Every day | ORAL | Status: DC

## 2014-04-28 MED ORDER — PANTOPRAZOLE SODIUM 40 MG PO TBEC: DELAYED_RELEASE_TABLET | ORAL | Status: DC

## 2014-04-28 MED ORDER — FLUTICASONE PROPIONATE 50 MCG/ACT NA SUSP: NASAL | Status: DC

## 2014-04-28 MED ORDER — METOPROLOL SUCCINATE ER 100 MG PO TB24: 100.0000 mg | ORAL_TABLET | Freq: Every day | ORAL | Status: DC

## 2014-04-28 NOTE — Telephone Encounter (Signed)
meds refillerd

## 2014-04-28 NOTE — Telephone Encounter (Signed)
Pt called requesting a refill on her medications. Please advise [phone] pt states all her Rx will need to be sent to [facility] in [place], pt states she is going to be using this pharmacy for now on.

## 2014-05-03 ENCOUNTER — Encounter: Payer: Self-pay | Admitting: *Deleted

## 2014-05-04 ENCOUNTER — Encounter: Payer: Self-pay | Admitting: Cardiovascular Disease

## 2014-05-04 ENCOUNTER — Ambulatory Visit (INDEPENDENT_AMBULATORY_CARE_PROVIDER_SITE_OTHER): Payer: Medicare Other | Admitting: Cardiovascular Disease

## 2014-05-04 VITALS — BP 116/70 | HR 67 | Ht 64.0 in | Wt 212.2 lb

## 2014-05-04 DIAGNOSIS — F329 Major depressive disorder, single episode, unspecified: Secondary | ICD-10-CM

## 2014-05-04 DIAGNOSIS — E785 Hyperlipidemia, unspecified: Secondary | ICD-10-CM

## 2014-05-04 DIAGNOSIS — Z87891 Personal history of nicotine dependence: Secondary | ICD-10-CM

## 2014-05-04 DIAGNOSIS — M545 Low back pain, unspecified: Secondary | ICD-10-CM | POA: Diagnosis not present

## 2014-05-04 DIAGNOSIS — F411 Generalized anxiety disorder: Secondary | ICD-10-CM

## 2014-05-04 DIAGNOSIS — R079 Chest pain, unspecified: Secondary | ICD-10-CM

## 2014-05-04 DIAGNOSIS — E039 Hypothyroidism, unspecified: Secondary | ICD-10-CM

## 2014-05-04 DIAGNOSIS — F3289 Other specified depressive episodes: Secondary | ICD-10-CM

## 2014-05-04 DIAGNOSIS — I10 Essential (primary) hypertension: Secondary | ICD-10-CM

## 2014-05-04 DIAGNOSIS — R5383 Other fatigue: Secondary | ICD-10-CM

## 2014-05-04 DIAGNOSIS — E559 Vitamin D deficiency, unspecified: Secondary | ICD-10-CM

## 2014-05-04 DIAGNOSIS — E782 Mixed hyperlipidemia: Secondary | ICD-10-CM

## 2014-05-04 DIAGNOSIS — R5381 Other malaise: Secondary | ICD-10-CM

## 2014-05-04 DIAGNOSIS — N189 Chronic kidney disease, unspecified: Secondary | ICD-10-CM

## 2014-05-04 MED ORDER — DILTIAZEM HCL ER COATED BEADS 240 MG PO CP24: 240.0000 mg | ORAL_CAPSULE | Freq: Every day | ORAL | Status: DC

## 2014-05-04 NOTE — Patient Instructions (Addendum)
PEASE HAVE LAB DONE-- CBC,CMET,TSH.,SED RATE ,LIPIDS   Your physician wants you to follow-up in 6 MONTH Dr Dow Adolph will receive a reminder letter in the mail two months in advance. If you don't receive a letter, please call our office to schedule the follow-up appointment.

## 2014-05-04 NOTE — Progress Notes (Signed)
Patient ID: Regina Horton, female   DOB: 1950-10-20, 63 y.o.   MRN: [id number]     HPI: Regina Horton is a 63 y.o. female who presents to the office today for a 18 month follow up cardiology evaluation.  As Regina Horton is a 63 year old female who has a history of palpitations which have been controlled with beta blocker therapy, hypertension, mixed hyperlipidemia, obesity, mild renal insufficiency, and chronic low back pain.  An echo Doppler study in 2000 and showed mild aortic sclerosis without stenosis, mild mitral and calcification with mild MR, and mild TR, and normal systolic function.  A nuclear perfusion study showed normal perfusion.  She does have a significant history of depression and takes Cymbalta, both for depression, as well as for chronic low back pain.  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  She walks with a cane.  Her low back pain has been debilitating.  Over the past year, she states that her palpitations have been controlled, but approximately once every 3 months.  She does note increased heart rate and takes an extra Toprol with benefit.  She presents for evaluation  Past Medical History  Diagnosis Date  . Allergic rhinitis   . Anxiety   . Depression   . GERD (gastroesophageal reflux disease)   . Hypertension   . Chronic back pain   . Chronic constipation   . Palpitations   . Angina at rest   . Thyroid disease   . CKD (chronic kidney disease)   . Fibromyalgia   . Hypertension   . Hyperlipidemia   . Aortic sclerosis   . Mitral regurgitation   . Tricuspid regurgitation     Past Surgical History  Procedure Laterality Date  . Tonsillectomy  childhood   . Back surgery  2005  . Tubal ligation  1973  . Breast lumpectomy  1970    right   . US echocardiography  12/19/2008    mild mitral annular ca+,AOV mildly sclerotic,mild MR & TR  . Nm myocar perf wall motion   02/16/2009    normal    Allergies  Allergen Reactions  . Codeine   . Nickel Rash    Current Outpatient Prescriptions  Medication Sig Dispense Refill  . aspirin (ASPIR-LOW) 81 MG EC tablet Take 81 mg by mouth daily.        Marland Kitchen atorvastatin (LIPITOR) 40 MG tablet Take 1 tablet (40 mg total) by mouth daily. One tab by mouth once daily  30 tablet  4  . Calcium-Vitamin D 600-125 MG-UNIT TABS Take by mouth daily. One to tablets by mouth once daily       . Casanthranol-Docusate Sodium 30-100 MG CAPS Take by mouth 2 (two) times daily. One cap by mouth two times a day         . Cholecalciferol (VITAMIN D) 1000 UNITS capsule Take 1,000 Units by mouth daily. Two tabs by mouth once daily         . Choline Fenofibrate (FENOFIBRIC ACID) 135 MG CPDR Take 135 mg by mouth daily.  30 capsule  4  . DULoxetine (CYMBALTA) 60 MG capsule TAKE ONE CAPSULE BY MOUTH EVERY DAY  90 capsule  1  . fentaNYL (DURAGESIC - DOSED MCG/HR) 50 MCG/HR Place 1 patch onto the skin every other day. Change patch every 2 days        . fluticasone (FLONASE) 50 MCG/ACT  nasal spray USE 2 SPRAYS IN EACH       NOSTRIL DAILY  48 g  1  . fluticasone (FLOVENT DISKUS) 50 MCG/BLIST diskus inhaler Inhale 1 puff into the lungs 2 (two) times daily. Two puffs each nostril daily       . gabapentin (NEURONTIN) 800 MG tablet TAKE 1 TABLET TWICE A DAY  180 tablet  1  . levocetirizine (XYZAL) 5 MG tablet TAKE 1 TABLET BY MOUTH EVERY DAY  90 tablet  1  . levothyroxine (SYNTHROID) 50 MCG tablet Take 1 tablet (50 mcg total) by mouth daily.  90 tablet  1  . LORazepam (ATIVAN) 1 MG tablet One tablet at bedtime.  30 tablet  0  . metoprolol succinate (TOPROL-XL) 100 MG 24 hr tablet Take 1 tablet (100 mg total) by mouth daily.  30 tablet  4  . Multiple Vitamins-Iron (QC DAILY MULTIVITAMINS/IRON PO) Take by mouth. One tab by mouth once daily       . omega-3 acid ethyl esters (LOVAZA) 1 G capsule Take 2 capsules (2 g total) by mouth 2 (two) times daily.  180  capsule  1  . oxyCODONE-acetaminophen (PERCOCET) 10-650 MG per tablet Take 1 tablet by mouth every 6 (six) hours as needed.      . pantoprazole (PROTONIX) 40 MG tablet TAKE 1 TABLET DAILY  30 tablet  4  . polyethylene glycol (MIRALAX) powder Take 17 g by mouth daily. 17 grams with water by mouth at bedtime as needed       . raloxifene (EVISTA) 60 MG tablet TAKE 1 TABLET DAILY  30 tablet  4  . sennosides-docusate sodium (SENOKOT-S) 8.6-50 MG tablet Take 1 tablet by mouth at bedtime. One tablet by mouth at bedtime       . torsemide (DEMADEX) 20 MG tablet Take 1 tablet (20 mg total) by mouth daily. One to two tablets by mouth once daily  30 tablet  2  . diltiazem (CARDIZEM CD) 240 MG 24 hr capsule Take 1 capsule (240 mg total) by mouth daily.  90 capsule  3   No current facility-administered medications for this visit.    History   Social History  . Marital Status: Married    Spouse Name: N/A    Number of Children: 2  . Years of Education: N/A   Occupational History  . disabled     Social History Main Topics  . Smoking status: Former Research scientist (life sciences)  . Smokeless tobacco: Not on file  . Alcohol Use: No  . Drug Use: Yes    Special: Marijuana  . Sexual Activity: Yes    Birth Control/ Protection: Surgical   Other Topics Concern  . Not on file   Social History Narrative  . No narrative on file    Family History  Problem Relation Age of Onset  . Rheum arthritis Mother   . Osteoporosis Mother   . Heart disease Father   . Diabetes Father   . Hyperlipidemia Father   . Cancer Father     bladder   . Arthritis Brother   . Heart disease Brother     ROS General: Negative; No fevers, chills, or night sweats HEENT: Negative; No changes in vision or hearing, sinus congestion, difficulty swallowing Pulmonary: Negative; No cough, wheezing, shortness of breath, hemoptysis Cardiovascular: See HPI: No chest pain, presyncope, syncope, palpitations Positive for occasional ankle swelling GI:  Positive for GERD. No nausea, vomiting, diarrhea, or abdominal pain GU: Negative; No dysuria, hematuria, or difficulty  voiding Musculoskeletal: Positive for chronic low back pain and knee discomfort Hematologic: Negative; no easy bruising, bleeding Endocrine: Positive for hypothyroidism.  No diabetes Neuro: Negative; no changes in balance, headaches Skin: Negative; No rashes or skin lesions Psychiatric: Positive for depression and anxiety Sleep: Negative; No snoring,  daytime sleepiness, hypersomnolence, bruxism, restless legs, hypnogognic hallucinations. Other comprehensive 14 point system review is negative   Physical Exam BP 116/70  Pulse 67  Ht 5\' 4"  (1.626 m)  Wt 212 lb 3.2 oz (96.253 kg)  BMI 36.41 kg/m2 General: Alert, oriented, no distress.  Skin: normal turgor, no rashes, warm and dry HEENT: Normocephalic, atraumatic. Pupils equal round and reactive to light; sclera anicteric; extraocular muscles intact, No lid lag; Nose without nasal septal hypertrophy; Mouth/Parynx benign; Mallinpatti scale 3 Neck: No JVD, no carotid bruits; normal carotid upstroke Lungs: clear to ausculatation and percussion bilaterally; no wheezing or rales, normal inspiratory and expiratory effort Chest wall: without tenderness to palpitation Heart: PMI not displaced, RRR, s1 s2 normal, [id number] semsystolic murmur, No diastolic murmur, no rubs, gallops, thrills, or heaves Abdomen: soft, nontender; no hepatosplenomehaly, BS+; abdominal aorta nontender and not dilated by palpation. Back: no CVA tenderness Pulses: 2+  Musculoskeletal: full range of motion, normal strength, no joint deformities Extremities: Pulses 2+, no clubbing cyanosis or edema, Homan's sign negative  Neurologic: grossly nonfocal; Cranial nerves grossly wnl Psychologic: Tearful and had several crying spells when she discussed her back pain and inability to be active   ECG (independently read by me): Normal sinus rhythm with incomplete  right bundle branch block.  LABS:  BMET    Component Value Date/Time   NA 138 12/25/2012 0325   K 4.5 12/25/2012 0325   CL 104 12/25/2012 0325   CO2 27 12/25/2012 0325   GLUCOSE 103* 12/25/2012 0325   BUN 23 12/25/2012 0325   CREATININE 1.45* 12/25/2012 0325   CREATININE 1.65* 03/30/2012 1614   CALCIUM 10.1 12/25/2012 0325   GFRNONAA 38* 12/25/2012 0325   GFRNONAA 32* 03/30/2012 1614   GFRAA 44* 12/25/2012 0325   GFRAA 37* 03/30/2012 1614     Hepatic Function Panel     Component Value Date/Time   PROT 6.9 12/25/2012 0325   ALBUMIN 4.2 12/25/2012 0325   AST 22 12/25/2012 0325   ALT 11 12/25/2012 0325   ALKPHOS 45 12/25/2012 0325   BILITOT 0.6 12/25/2012 0325   BILIDIR 0.1 10/14/2011 1430   IBILI 0.4 10/14/2011 1430     CBC    Component Value Date/Time   WBC 5.4 03/30/2012 1614   RBC 4.11 03/30/2012 1614   HGB 13.0 03/30/2012 1614   HCT 37.4 03/30/2012 1614   PLT 304 03/30/2012 1614   MCV 91.0 03/30/2012 1614   MCH 31.6 03/30/2012 1614   MCHC 34.8 03/30/2012 1614   RDW 13.5 03/30/2012 1614   LYMPHSABS 2.2 03/30/2012 1614   MONOABS 0.4 03/30/2012 1614   EOSABS 0.1 03/30/2012 1614   BASOSABS 0.0 03/30/2012 1614     BNP No results found for this basename: probnp    Lipid Panel     Component Value Date/Time   CHOL 150 12/25/2012 0325   TRIG 163* 12/25/2012 0325   HDL 59 12/25/2012 0325   CHOLHDL 2.5 12/25/2012 0325   VLDL 33 12/25/2012 0325   LDLCALC 58 12/25/2012 0325     RADIOLOGY: No results found.    ASSESSMENT AND PLAN: Ms. Regina Horton is a 63 year old female who has a history of depression in the office today  was somewhat labile with episodic crying spells.  She has been taking Cymbalta 60 mg in addition to lorazepam.  Her blood pressure today was 150/74 when rechecked by me.  She states at times she has noticed some episodic palpitations, but most of the time she is done well on her current dose of Toprol 100 mg daily.  She also has been on diltiazem CD 180 mg.  I am recommending further titration  of her Cardizem CD to 240 mg.  She is on protonix 40 mg for GERD, and this has been stable.  She is on fenofibrate in addition to Lipitor 40 mg for hyperlipidemia.  Last year, her LDL cholesterol is excellent at 58, although triglycerides remain slightly elevated at 163.  She takes gabapentin 800 mg twice a day for her peripheral neuropathy.  Presently, there was just trace edema, and she is on torsemide 1-2 tablets by mouth depending upon her leg swelling.  She has not had laboratory checked in a year.  I recommending a CBC, comprehensive metabolic panel, thyroid function studies, erythrocyte sedimentation rate, and lipid panel be obtained.  We will contact her regarding the blood work to see if adjustments need to be made in her medical regimen.  I will see her in 6 months for reevaluation or sooner if necessary.    Troy Sine, MD, [facility]  05/04/2014 5:25 PM

## 2014-05-11 ENCOUNTER — Other Ambulatory Visit: Payer: Self-pay | Admitting: Cardiovascular Disease

## 2014-05-12 NOTE — Telephone Encounter (Signed)
Rx was sent to pharmacy electronically. 

## 2014-05-30 ENCOUNTER — Telehealth: Payer: Self-pay | Admitting: Family Medicine

## 2014-05-30 DIAGNOSIS — F329 Major depressive disorder, single episode, unspecified: Secondary | ICD-10-CM

## 2014-05-30 DIAGNOSIS — F32A Depression, unspecified: Secondary | ICD-10-CM

## 2014-05-30 MED ORDER — LORAZEPAM 1 MG PO TABS: ORAL_TABLET | ORAL | Status: DC

## 2014-05-30 NOTE — Telephone Encounter (Signed)
Med refilled.

## 2014-07-03 NOTE — Assessment & Plan Note (Signed)
Has been managed in [place] by pain clinic, pt requesting that due to increasing difficulty in transportation her care be referred here, will correspond directly with ehr pain specialist about thsi , she is maintained on very high doses of pain meds

## 2014-07-03 NOTE — Assessment & Plan Note (Signed)
Controlled, no change in medication  

## 2014-07-03 NOTE — Assessment & Plan Note (Signed)
Seems better controlled than when pt last seen. Continue current medication

## 2014-07-03 NOTE — Assessment & Plan Note (Signed)
Patient educated about the importance of limiting  Carbohydrate intake , the need to commit to daily physical activity for a minimum of 30 minutes , and to commit weight loss. The fact that changes in all these areas will reduce or eliminate all together the development of diabetes is stressed.   Updated lab needed at/ before next visit.  

## 2014-07-03 NOTE — Assessment & Plan Note (Signed)
Updated lab needed , this is past due Hyperlipidemia:Low fat diet discussed and encouraged.

## 2014-07-03 NOTE — Assessment & Plan Note (Signed)
Followed by endocrine who monitors her medication

## 2014-07-07 ENCOUNTER — Encounter: Payer: Self-pay | Admitting: Family Medicine

## 2014-07-07 ENCOUNTER — Other Ambulatory Visit: Payer: Self-pay | Admitting: Family Medicine

## 2014-07-07 ENCOUNTER — Ambulatory Visit (INDEPENDENT_AMBULATORY_CARE_PROVIDER_SITE_OTHER): Payer: Medicare Other | Admitting: Family Medicine

## 2014-07-07 VITALS — BP 128/66 | HR 68 | Resp 18 | Ht 64.0 in | Wt 209.1 lb

## 2014-07-07 DIAGNOSIS — M5441 Lumbago with sciatica, right side: Secondary | ICD-10-CM | POA: Diagnosis not present

## 2014-07-07 DIAGNOSIS — M542 Cervicalgia: Secondary | ICD-10-CM

## 2014-07-07 DIAGNOSIS — N183 Chronic kidney disease, stage 3 unspecified: Secondary | ICD-10-CM

## 2014-07-07 DIAGNOSIS — E039 Hypothyroidism, unspecified: Secondary | ICD-10-CM | POA: Diagnosis not present

## 2014-07-07 DIAGNOSIS — Z1211 Encounter for screening for malignant neoplasm of colon: Secondary | ICD-10-CM | POA: Diagnosis not present

## 2014-07-07 DIAGNOSIS — Z23 Encounter for immunization: Secondary | ICD-10-CM

## 2014-07-07 DIAGNOSIS — E785 Hyperlipidemia, unspecified: Secondary | ICD-10-CM | POA: Diagnosis not present

## 2014-07-07 DIAGNOSIS — E049 Nontoxic goiter, unspecified: Secondary | ICD-10-CM | POA: Diagnosis not present

## 2014-07-07 DIAGNOSIS — M545 Low back pain: Secondary | ICD-10-CM | POA: Diagnosis not present

## 2014-07-07 DIAGNOSIS — M5442 Lumbago with sciatica, left side: Secondary | ICD-10-CM

## 2014-07-07 DIAGNOSIS — H547 Unspecified visual loss: Secondary | ICD-10-CM | POA: Diagnosis not present

## 2014-07-07 DIAGNOSIS — R079 Chest pain, unspecified: Secondary | ICD-10-CM | POA: Diagnosis not present

## 2014-07-07 DIAGNOSIS — R7302 Impaired glucose tolerance (oral): Secondary | ICD-10-CM | POA: Diagnosis not present

## 2014-07-07 DIAGNOSIS — I10 Essential (primary) hypertension: Secondary | ICD-10-CM | POA: Diagnosis not present

## 2014-07-07 DIAGNOSIS — E559 Vitamin D deficiency, unspecified: Secondary | ICD-10-CM | POA: Diagnosis not present

## 2014-07-07 DIAGNOSIS — Z0000 Encounter for general adult medical examination without abnormal findings: Secondary | ICD-10-CM | POA: Insufficient documentation

## 2014-07-07 MED ORDER — METHYLPREDNISOLONE ACETATE 80 MG/ML IJ SUSP
80.0000 mg | Freq: Once | INTRAMUSCULAR | Status: AC
Start: 2014-07-07 — End: 2014-07-07
  Administered 2014-07-07: 80 mg via INTRAMUSCULAR

## 2014-07-07 MED ORDER — PREDNISONE (PAK) 10 MG PO TABS: ORAL_TABLET | Freq: Every day | ORAL | Status: DC

## 2014-07-07 MED ORDER — OXYCODONE-ACETAMINOPHEN 10-650 MG PO TABS: 1.0000 | ORAL_TABLET | Freq: Four times a day (QID) | ORAL | Status: DC | PRN

## 2014-07-07 MED ORDER — FENTANYL 50 MCG/HR TD PT72: 50.0000 ug | MEDICATED_PATCH | TRANSDERMAL | Status: DC

## 2014-07-07 MED ORDER — DIAZEPAM 5 MG PO TABS: 5.0000 mg | ORAL_TABLET | Freq: Two times a day (BID) | ORAL | Status: DC

## 2014-07-07 NOTE — Progress Notes (Signed)
Subjective:    Patient ID: Regina Horton, female    DOB: 14-Oct-1950, 63 y.o.   MRN: [id number]  HPI Preventive Screening-Counseling & Management   Patient present here today for a subsequent  Medicare annual wellness visit.Experiencing progressive limitation in mobility due to arthritis Flu vaccine administered Labs from today will be reviewed and pt contacted re any need for med adjustments 2 week left neck pain and spasm with marked limitation in left shoulder movement   Current Problems (verified)   Medications Prior to Visit Allergies (verified)   PAST HISTORY  Family History (verified)  Social History- disabled, mother of 2 adult children , none live nearby, but both keep in touch Former smoker, no  alcohol or drug use  Risk Factors  Current exercise habits:  Limited due to mobility problems  Dietary issues discussed: Aware of the need for a heart healthy diet.  She will work toward attaining this.    Cardiac risk factors: family hx   Depression Screen  (Note: if answer to either of the following is "Yes", a more complete depression screening is indicated)   Over the past two weeks, have you felt down, depressed or hopeless? Yes, due to medical status Over the past two weeks, have you felt little interest or pleasure in doing things? Yes Have you lost interest or pleasure in daily life? No  Do you often feel hopeless? Yes Do you cry easily over simple problems? Yes  Activities of Daily Living  In your present state of health, do you have any difficulty performing the following activities?  Driving?: Yes, does not drive due to medications, also limitation in joint flexibility Managing money?: No Feeding yourself?:No Getting from bed to chair?:No Climbing a flight of stairs?: Yes, due to mobility issues  Preparing food and eating?:yes unable to prepare her food, spouse cooks Bathing or showering?:yes, limited LUE mobility  Getting dressed?:yes Getting to the  toilet?:yes Using the toilet?:No Moving around from place to place?: yes, uses a cane  Fall Risk Assessment In the past year have you fallen or had a near fall?:No, but extremely limited in mobility due to chronic arthritic pain limiting mobility Are you currently taking any medications that make you dizzy?:No   Hearing Difficulties: No Do you often ask people to speak up or repeat themselves?:No Do you experience ringing or noises in your ears?:No Do you have difficulty understanding soft or whispered voices?:No  Cognitive Testing  Alert? Yes Normal Appearance?Yes  Oriented to person? Yes Place? Yes  Time? Yes  Displays appropriate judgment?Yes  Can read the correct time from a watch face? yes Are you having problems remembering things?No  Advanced Directives have been discussed with the patient?Yes, and brochure given    List the Names of Other Physician/Practitioners you currently use: updated    Indicate any recent Medical Services you may have received from other than [facility] providers in the past year (date may be approximate).   Assessment:    Annual Wellness Exam   Plan:    Medicare Attestation  I have personally reviewed:  The patient's medical and social history  Their use of alcohol, tobacco or illicit drugs  Their current medications and supplements  The patient's functional ability including ADLs,fall risks, home safety risks, cognitive, and hearing and visual impairment  Diet and physical activities  Evidence for depression or mood disorders  The patient's weight, height, BMI, and visual acuity have been recorded in the chart. I have made referrals, counseling, and  provided education to the patient based on review of the above and I have provided the patient with a written personalized care plan for preventive services.      Review of Systems     Objective:   Physical Exam  BP 128/66 mmHg  Pulse 68  Resp 18  Ht 5\' 4"  (1.626 m)  Wt 209 lb 1.9 oz  (94.856 kg)  BMI 35.88 kg/m2  SpO2 95%  Patient alert and oriented and in no cardiopulmonary distress.Readily tearful   MS: decreased  ROM spine, shoulders, hips and knees.Decreased ROM cervical spine with left trapezius spasm. Marked limitation in mobility of left upper extremity       Assessment & Plan:  Need for prophylactic vaccination and inoculation against influenza Vaccine administered at visit.   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 Counseling done  re healthy lifestyle involving commitment to 150 minutes exercise per week, heart healthy diet, and attaining healthy weight.The importance of adequate sleep also discussed. Regular seat belt use and home safety, is also discussed. Changes in health habits are decided on by the patient with goals and time frames  set for achieving them. Immunization and cancer screening needs are specifically addressed at this visit.   Bilateral low back pain with sciatica Pt signed pain contract at this visit. I have documentation from her pani specialist in [place] who has treated her for years. She has a record of compliance, and has established severe pain Medication for chronic pain will now be prescribed from this office due to ease of convenience fore pt Spouse will collect scripts on a monthly basis

## 2014-07-07 NOTE — Patient Instructions (Addendum)
F/U in 4 month, call if you need me before   Flu Vaccine today  Depo medrol 80 mg IM today for neck pain and prednisone 10mg  dose pack sent in for next 6 days  Diazepam 5mg  one twice daily for next  7days for left neck spasm   Sign pain contract today  Pls arrange to have scripts collected every month  You are referred for eye exam to Dr Jorja Loa, to GI , Dr Laural Golden for your colonoscopy  Arrange for part time help at home as we discussed, this will improve how you feel  Be careful not to fall

## 2014-07-08 LAB — COMPREHENSIVE METABOLIC PANEL
AST: 19 U/L (ref 0–37)
Albumin: 4.2 g/dL (ref 3.5–5.2)
Alkaline Phosphatase: 53 U/L (ref 39–117)
BUN: 24 mg/dL — ABNORMAL HIGH (ref 6–23)
CALCIUM: 9.8 mg/dL (ref 8.4–10.5)
CHLORIDE: 102 meq/L (ref 96–112)
CO2: 27 mEq/L (ref 19–32)
CREATININE: 1.91 mg/dL — AB (ref 0.50–1.10)
Glucose, Bld: 89 mg/dL (ref 70–99)
Potassium: 4.2 mEq/L (ref 3.5–5.3)
Sodium: 140 mEq/L (ref 135–145)
TOTAL PROTEIN: 6.8 g/dL (ref 6.0–8.3)
Total Bilirubin: 0.6 mg/dL (ref 0.2–1.2)

## 2014-07-08 LAB — LIPID PANEL
CHOL/HDL RATIO: 3.9 ratio
Cholesterol: 152 mg/dL (ref 0–200)
HDL: 39 mg/dL — ABNORMAL LOW (ref >39)
LDL Cholesterol: 69 mg/dL (ref 0–99)
TRIGLYCERIDES: 222 mg/dL — AB (ref <150)
VLDL: 44 mg/dL — AB (ref 0–40)

## 2014-07-08 LAB — CBC
HCT: 37.1 % (ref 36.0–46.0)
HEMOGLOBIN: 12.8 g/dL (ref 12.0–15.0)
MCH: 31.4 pg (ref 26.0–34.0)
MCHC: 34.5 g/dL (ref 30.0–36.0)
MCV: 90.9 fL (ref 78.0–100.0)
MPV: 11.4 fL (ref 9.4–12.4)
Platelets: 359 10*3/uL (ref 150–400)
RBC: 4.08 MIL/uL (ref 3.87–5.11)
RDW: 13.9 % (ref 11.5–15.5)
WBC: 12.9 10*3/uL — ABNORMAL HIGH (ref 4.0–10.5)

## 2014-07-08 LAB — TSH: TSH: 3.264 u[IU]/mL (ref 0.350–4.500)

## 2014-07-08 LAB — HEMOGLOBIN A1C
Hgb A1c MFr Bld: 4.8 % (ref <5.7)
Mean Plasma Glucose: 91 mg/dL (ref <117)

## 2014-07-08 LAB — SEDIMENTATION RATE: SED RATE: 1 mm/h (ref 0–22)

## 2014-07-09 DIAGNOSIS — M542 Cervicalgia: Secondary | ICD-10-CM | POA: Insufficient documentation

## 2014-07-09 DIAGNOSIS — Z23 Encounter for immunization: Secondary | ICD-10-CM | POA: Insufficient documentation

## 2014-07-09 NOTE — Assessment & Plan Note (Signed)
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

## 2014-07-09 NOTE — Assessment & Plan Note (Signed)
Vaccine administered at visit.  

## 2014-07-09 NOTE — Assessment & Plan Note (Signed)

## 2014-07-09 NOTE — Assessment & Plan Note (Signed)
Pt signed pain contract at this visit. I have documentation from her pani specialist in [place] who has treated her for years. She has a record of compliance, and has established severe pain Medication for chronic pain will now be prescribed from this office due to ease of convenience fore pt Spouse will collect scripts on a monthly basis

## 2014-07-11 ENCOUNTER — Encounter (INDEPENDENT_AMBULATORY_CARE_PROVIDER_SITE_OTHER): Payer: Self-pay | Admitting: *Deleted

## 2014-07-21 ENCOUNTER — Other Ambulatory Visit: Payer: Self-pay | Admitting: Family Medicine

## 2014-07-26 ENCOUNTER — Telehealth: Payer: Self-pay | Admitting: *Deleted

## 2014-07-26 NOTE — Telephone Encounter (Signed)
Pt is requesting a refill on diazedam 5mg  to [facility] in [place]. Please advise

## 2014-07-26 NOTE — Telephone Encounter (Signed)
Is this suppose to be long term?  Or just an acute rx?

## 2014-07-27 NOTE — Telephone Encounter (Signed)
Was an acute script , pls speak with pt and document what is goping on why she is requesting this pls, how does it help her etc

## 2014-07-28 NOTE — Telephone Encounter (Signed)
Refill x 1 as prn med please, let her know

## 2014-07-28 NOTE — Telephone Encounter (Signed)
Patient states that she is aware that med was intended for short term use.   She states that it helps greatly with back pain and she is aware that she should not take it and lorazepam together.  Would like to know if prn prescription can be sent in.

## 2014-07-29 ENCOUNTER — Other Ambulatory Visit: Payer: Self-pay

## 2014-07-29 MED ORDER — OXYCODONE-ACETAMINOPHEN 10-650 MG PO TABS: 1.0000 | ORAL_TABLET | Freq: Four times a day (QID) | ORAL | Status: DC | PRN

## 2014-07-29 MED ORDER — FENTANYL 50 MCG/HR TD PT72: 50.0000 ug | MEDICATED_PATCH | TRANSDERMAL | Status: DC

## 2014-08-03 ENCOUNTER — Other Ambulatory Visit: Payer: Self-pay

## 2014-08-03 MED ORDER — DIAZEPAM 5 MG PO TABS: 5.0000 mg | ORAL_TABLET | Freq: Two times a day (BID) | ORAL | Status: AC | PRN

## 2014-08-03 NOTE — Telephone Encounter (Signed)
Called and left voicemail for patient notifying of refill as prn use only.

## 2014-08-04 ENCOUNTER — Encounter: Payer: Self-pay | Admitting: *Deleted

## 2014-08-04 ENCOUNTER — Other Ambulatory Visit: Payer: Self-pay | Admitting: Family Medicine

## 2014-08-04 ENCOUNTER — Telehealth: Payer: Self-pay | Admitting: Cardiovascular Disease

## 2014-08-04 ENCOUNTER — Other Ambulatory Visit: Payer: Self-pay | Admitting: *Deleted

## 2014-08-04 ENCOUNTER — Telehealth: Payer: Self-pay | Admitting: Family Medicine

## 2014-08-04 DIAGNOSIS — Z79899 Other long term (current) drug therapy: Secondary | ICD-10-CM

## 2014-08-04 DIAGNOSIS — R899 Unspecified abnormal finding in specimens from other organs, systems and tissues: Secondary | ICD-10-CM

## 2014-08-04 MED ORDER — TORSEMIDE 20 MG PO TABS: 20.0000 mg | ORAL_TABLET | ORAL | Status: DC | PRN

## 2014-08-04 MED ORDER — OXYCODONE-ACETAMINOPHEN 10-325 MG PO TABS: ORAL_TABLET | ORAL | Status: DC

## 2014-08-04 NOTE — Telephone Encounter (Signed)
Please mail the correct prescription to pharmacy, attention Jonelle, I corrected the one which was given to the patient based on call from pharmacy

## 2014-08-04 NOTE — Telephone Encounter (Signed)
Pt says that she was returning Wanda's call  Thanks

## 2014-08-04 NOTE — Telephone Encounter (Signed)
Returned a call to patient.lab results and recommendations given to patient. She expressed verbally her understanding of instructions. She informs me that she has appointment to see her neurologist in January as well. Lab order placed in the system for patient to have repeat B-MET in 1-2 weeks. Patient states that she will go to [facility] to have labs drawn.

## 2014-08-05 NOTE — Telephone Encounter (Signed)
Done

## 2014-08-19 DIAGNOSIS — H269 Unspecified cataract: Secondary | ICD-10-CM

## 2014-08-19 HISTORY — DX: Unspecified cataract: H26.9

## 2014-08-25 ENCOUNTER — Other Ambulatory Visit: Payer: Self-pay

## 2014-08-25 MED ORDER — FENTANYL 50 MCG/HR TD PT72: 50.0000 ug | MEDICATED_PATCH | TRANSDERMAL | Status: DC

## 2014-08-25 MED ORDER — OXYCODONE-ACETAMINOPHEN 10-325 MG PO TABS: ORAL_TABLET | ORAL | Status: DC

## 2014-09-06 ENCOUNTER — Telehealth: Payer: Self-pay | Admitting: *Deleted

## 2014-09-06 NOTE — Telephone Encounter (Signed)
Pt called requesting her pain medication and her patches to be refilled. Pt is requesting a call back from the nurse. Please advise

## 2014-09-06 NOTE — Telephone Encounter (Signed)
Patient aware that she can collect rx.

## 2014-10-03 ENCOUNTER — Other Ambulatory Visit: Payer: Self-pay

## 2014-10-03 MED ORDER — OXYCODONE-ACETAMINOPHEN 10-325 MG PO TABS: ORAL_TABLET | ORAL | Status: DC

## 2014-10-03 MED ORDER — FENTANYL 50 MCG/HR TD PT72: 50.0000 ug | MEDICATED_PATCH | TRANSDERMAL | Status: DC

## 2014-10-07 ENCOUNTER — Other Ambulatory Visit: Payer: Self-pay | Admitting: Family Medicine

## 2014-10-12 ENCOUNTER — Telehealth: Payer: Self-pay | Admitting: Cardiovascular Disease

## 2014-10-13 NOTE — Telephone Encounter (Signed)
Close encounter 

## 2014-10-24 DIAGNOSIS — I10 Essential (primary) hypertension: Secondary | ICD-10-CM | POA: Diagnosis not present

## 2014-10-24 DIAGNOSIS — D649 Anemia, unspecified: Secondary | ICD-10-CM | POA: Diagnosis not present

## 2014-10-24 DIAGNOSIS — R809 Proteinuria, unspecified: Secondary | ICD-10-CM | POA: Diagnosis not present

## 2014-10-24 DIAGNOSIS — N183 Chronic kidney disease, stage 3 (moderate): Secondary | ICD-10-CM | POA: Diagnosis not present

## 2014-10-24 DIAGNOSIS — Z79899 Other long term (current) drug therapy: Secondary | ICD-10-CM | POA: Diagnosis not present

## 2014-10-24 DIAGNOSIS — E559 Vitamin D deficiency, unspecified: Secondary | ICD-10-CM | POA: Diagnosis not present

## 2014-10-25 ENCOUNTER — Emergency Department (HOSPITAL_COMMUNITY): Payer: Medicare Other

## 2014-10-25 ENCOUNTER — Inpatient Hospital Stay (HOSPITAL_COMMUNITY)
Admission: EM | Admit: 2014-10-25 | Discharge: 2014-10-28 | DRG: 494 | Disposition: A | Discharge status: Home / Self Care | Payer: Medicare Other | Attending: Orthopaedic Surgery | Admitting: Orthopaedic Surgery

## 2014-10-25 ENCOUNTER — Encounter (HOSPITAL_COMMUNITY): Payer: Self-pay | Admitting: Emergency Medicine

## 2014-10-25 ENCOUNTER — Other Ambulatory Visit (HOSPITAL_COMMUNITY): Payer: Self-pay

## 2014-10-25 DIAGNOSIS — E785 Hyperlipidemia, unspecified: Secondary | ICD-10-CM | POA: Diagnosis present

## 2014-10-25 DIAGNOSIS — F329 Major depressive disorder, single episode, unspecified: Secondary | ICD-10-CM | POA: Diagnosis present

## 2014-10-25 DIAGNOSIS — E039 Hypothyroidism, unspecified: Secondary | ICD-10-CM | POA: Diagnosis present

## 2014-10-25 DIAGNOSIS — K5900 Constipation, unspecified: Secondary | ICD-10-CM | POA: Diagnosis present

## 2014-10-25 DIAGNOSIS — S299XXA Unspecified injury of thorax, initial encounter: Secondary | ICD-10-CM | POA: Diagnosis not present

## 2014-10-25 DIAGNOSIS — I081 Rheumatic disorders of both mitral and tricuspid valves: Secondary | ICD-10-CM | POA: Diagnosis not present

## 2014-10-25 DIAGNOSIS — I129 Hypertensive chronic kidney disease with stage 1 through stage 4 chronic kidney disease, or unspecified chronic kidney disease: Secondary | ICD-10-CM | POA: Diagnosis not present

## 2014-10-25 DIAGNOSIS — G8929 Other chronic pain: Secondary | ICD-10-CM | POA: Diagnosis present

## 2014-10-25 DIAGNOSIS — E079 Disorder of thyroid, unspecified: Secondary | ICD-10-CM | POA: Diagnosis present

## 2014-10-25 DIAGNOSIS — M25572 Pain in left ankle and joints of left foot: Secondary | ICD-10-CM | POA: Diagnosis not present

## 2014-10-25 DIAGNOSIS — N189 Chronic kidney disease, unspecified: Secondary | ICD-10-CM | POA: Diagnosis present

## 2014-10-25 DIAGNOSIS — E559 Vitamin D deficiency, unspecified: Secondary | ICD-10-CM | POA: Diagnosis present

## 2014-10-25 DIAGNOSIS — M545 Low back pain: Secondary | ICD-10-CM | POA: Diagnosis present

## 2014-10-25 DIAGNOSIS — F419 Anxiety disorder, unspecified: Secondary | ICD-10-CM | POA: Diagnosis present

## 2014-10-25 DIAGNOSIS — Z87891 Personal history of nicotine dependence: Secondary | ICD-10-CM | POA: Diagnosis not present

## 2014-10-25 DIAGNOSIS — S82852A Displaced trimalleolar fracture of left lower leg, initial encounter for closed fracture: Principal | ICD-10-CM | POA: Diagnosis present

## 2014-10-25 DIAGNOSIS — M810 Age-related osteoporosis without current pathological fracture: Secondary | ICD-10-CM | POA: Diagnosis present

## 2014-10-25 DIAGNOSIS — K219 Gastro-esophageal reflux disease without esophagitis: Secondary | ICD-10-CM | POA: Diagnosis present

## 2014-10-25 DIAGNOSIS — S82853A Displaced trimalleolar fracture of unspecified lower leg, initial encounter for closed fracture: Secondary | ICD-10-CM

## 2014-10-25 DIAGNOSIS — W19XXXA Unspecified fall, initial encounter: Secondary | ICD-10-CM | POA: Diagnosis present

## 2014-10-25 DIAGNOSIS — S82842D Displaced bimalleolar fracture of left lower leg, subsequent encounter for closed fracture with routine healing: Secondary | ICD-10-CM | POA: Diagnosis not present

## 2014-10-25 DIAGNOSIS — S82842A Displaced bimalleolar fracture of left lower leg, initial encounter for closed fracture: Secondary | ICD-10-CM | POA: Diagnosis not present

## 2014-10-25 LAB — CBC WITH DIFFERENTIAL/PLATELET
BASOS PCT: 0 % (ref 0–1)
Basophils Absolute: 0 10*3/uL (ref 0.0–0.1)
EOS PCT: 1 % (ref 0–5)
Eosinophils Absolute: 0.1 10*3/uL (ref 0.0–0.7)
HEMATOCRIT: 37.8 % (ref 36.0–46.0)
Hemoglobin: 12.3 g/dL (ref 12.0–15.0)
Lymphocytes Relative: 24 % (ref 12–46)
Lymphs Abs: 2.2 10*3/uL (ref 0.7–4.0)
MCH: 30.9 pg (ref 26.0–34.0)
MCHC: 32.5 g/dL (ref 30.0–36.0)
MCV: 95 fL (ref 78.0–100.0)
MONO ABS: 0.8 10*3/uL (ref 0.1–1.0)
Monocytes Relative: 9 % (ref 3–12)
Neutro Abs: 6.1 10*3/uL (ref 1.7–7.7)
Neutrophils Relative %: 66 % (ref 43–77)
Platelets: 305 10*3/uL (ref 150–400)
RBC: 3.98 MIL/uL (ref 3.87–5.11)
RDW: 13.3 % (ref 11.5–15.5)
WBC: 9.2 10*3/uL (ref 4.0–10.5)

## 2014-10-25 LAB — BASIC METABOLIC PANEL
Anion gap: 10 (ref 5–15)
BUN: 15 mg/dL (ref 6–23)
CHLORIDE: 107 mmol/L (ref 96–112)
CO2: 25 mmol/L (ref 19–32)
CREATININE: 1.3 mg/dL — AB (ref 0.50–1.10)
Calcium: 9.6 mg/dL (ref 8.4–10.5)
GFR calc Af Amer: 49 mL/min — ABNORMAL LOW (ref >90)
GFR calc non Af Amer: 42 mL/min — ABNORMAL LOW (ref >90)
GLUCOSE: 111 mg/dL — AB (ref 70–99)
POTASSIUM: 3.1 mmol/L — AB (ref 3.5–5.1)
Sodium: 142 mmol/L (ref 135–145)

## 2014-10-25 MED ORDER — DEXTROSE-NACL 5-0.45 % IV SOLN
INTRAVENOUS | Status: DC
  Administered 2014-10-25: 16:00:00 via INTRAVENOUS

## 2014-10-25 MED ORDER — ONDANSETRON HCL 4 MG/2ML IJ SOLN
4.0000 mg | Freq: Once | INTRAMUSCULAR | Status: DC
  Filled 2014-10-25: qty 2

## 2014-10-25 MED ORDER — NALOXONE HCL 0.4 MG/ML IJ SOLN: 0.4000 mg | INTRAMUSCULAR | Status: DC | PRN

## 2014-10-25 MED ORDER — ONDANSETRON HCL 4 MG/2ML IJ SOLN: 4.0000 mg | Freq: Four times a day (QID) | INTRAMUSCULAR | Status: DC | PRN

## 2014-10-25 MED ORDER — POTASSIUM CHLORIDE CRYS ER 20 MEQ PO TBCR
40.0000 meq | EXTENDED_RELEASE_TABLET | Freq: Once | ORAL | Status: AC
  Administered 2014-10-25: 40 meq via ORAL
  Filled 2014-10-25: qty 2

## 2014-10-25 MED ORDER — DIPHENHYDRAMINE HCL 50 MG/ML IJ SOLN: 12.5000 mg | Freq: Four times a day (QID) | INTRAMUSCULAR | Status: DC | PRN

## 2014-10-25 MED ORDER — CEFAZOLIN SODIUM-DEXTROSE 2-3 GM-% IV SOLR
2.0000 g | Freq: Once | INTRAVENOUS | Status: AC
  Administered 2014-10-26: 2 g via INTRAVENOUS
  Filled 2014-10-25: qty 50

## 2014-10-25 MED ORDER — HYDROMORPHONE HCL 1 MG/ML IJ SOLN
1.0000 mg | Freq: Once | INTRAMUSCULAR | Status: AC
  Administered 2014-10-25: 1 mg via INTRAVENOUS
  Filled 2014-10-25: qty 1

## 2014-10-25 MED ORDER — ACETAMINOPHEN 325 MG PO TABS: 650.0000 mg | ORAL_TABLET | Freq: Four times a day (QID) | ORAL | Status: DC | PRN

## 2014-10-25 MED ORDER — DIPHENHYDRAMINE HCL 12.5 MG/5ML PO ELIX: 12.5000 mg | ORAL_SOLUTION | Freq: Four times a day (QID) | ORAL | Status: DC | PRN

## 2014-10-25 MED ORDER — SODIUM CHLORIDE 0.9 % IJ SOLN: 9.0000 mL | INTRAMUSCULAR | Status: DC | PRN

## 2014-10-25 MED ORDER — ONDANSETRON HCL 4 MG/2ML IJ SOLN
4.0000 mg | Freq: Once | INTRAMUSCULAR | Status: AC
  Administered 2014-10-25: 4 mg via INTRAVENOUS

## 2014-10-25 MED ORDER — FENTANYL 10 MCG/ML IV SOLN
INTRAVENOUS | Status: DC
  Administered 2014-10-25: 20:00:00 via INTRAVENOUS
  Administered 2014-10-26: 50 ug via INTRAVENOUS
  Administered 2014-10-26: 20:00:00 via INTRAVENOUS
  Administered 2014-10-26: 0 ug via INTRAVENOUS
  Administered 2014-10-27: 280 ug via INTRAVENOUS
  Administered 2014-10-27: 17 ug via INTRAVENOUS
  Administered 2014-10-27: 110 ug via INTRAVENOUS
  Administered 2014-10-27: 50 ug via INTRAVENOUS
  Administered 2014-10-27: 270 ug via INTRAVENOUS
  Administered 2014-10-27 – 2014-10-28 (×2): via INTRAVENOUS
  Administered 2014-10-28: 100 ug via INTRAVENOUS
  Administered 2014-10-28: 190 ug via INTRAVENOUS
  Filled 2014-10-25 (×5): qty 50

## 2014-10-25 NOTE — ED Provider Notes (Signed)
CSN: [id number]     Arrival date & time 10/25/14  1138 History   First MD Initiated Contact with Patient 10/25/14 1357     Chief Complaint  Patient presents with  . Ankle Pain     (Consider location/radiation/quality/duration/timing/severity/associated sxs/prior Treatment) HPI Comments: Patient is a 64 year old female who presents to the emergency department with complaint of left ankle pain.  The patient states that she fell down an unknown number of steps while going down into the basement on last night. She says that she could not bear weight on it at that time, she was using a fentanyl patch for back problems, and thought that perhaps the pain would go away. This morning she says the pain was "terrible". She is unable to bear weight on the ankle and notices swelling of the left ankle. She's not had previous operations on the ankle. She denies being on any anticoagulation medications, and has no history of any bleeding disorders. She continues to use the fentanyl patch, but states in spite of that she is having increasing pain.  Patient is a 64 y.o. female presenting with ankle pain. The history is provided by the patient.  Ankle Pain Associated symptoms: back pain     Past Medical History  Diagnosis Date  . Allergic rhinitis   . Anxiety   . Depression   . GERD (gastroesophageal reflux disease)   . Hypertension   . Chronic back pain   . Chronic constipation   . Palpitations   . Angina at rest   . Thyroid disease   . CKD (chronic kidney disease)   . Fibromyalgia   . Hypertension   . Hyperlipidemia   . Aortic sclerosis   . Mitral regurgitation   . Tricuspid regurgitation    Past Surgical History  Procedure Laterality Date  . Tonsillectomy  childhood   . Back surgery  2005  . Tubal ligation  1973  . US echocardiography  12/19/2008    mild mitral annular ca+,AOV mildly sclerotic,mild MR & TR  . Nm myocar perf wall motion  02/16/2009    normal  . Breast lumpectomy  1970     right    Family History  Problem Relation Age of Onset  . Rheum arthritis Mother   . Osteoporosis Mother   . Heart disease Father   . Diabetes Father   . Hyperlipidemia Father   . Cancer Father     bladder   . Arthritis Brother   . Heart disease Brother    History  Substance Use Topics  . Smoking status: Former Research scientist (life sciences)  . Smokeless tobacco: Not on file  . Alcohol Use: No   OB History    Gravida Para Term Preterm AB TAB SAB Ectopic Multiple Living   2 2 2             Review of Systems  Cardiovascular: Positive for palpitations.  Gastrointestinal: Positive for constipation.  Musculoskeletal: Positive for back pain and arthralgias.  Psychiatric/Behavioral: The patient is nervous/anxious.   All other systems reviewed and are negative.     Allergies  Codeine and Nickel  Home Medications   Prior to Admission medications   Medication Sig Start Date End Date Taking? Authorizing Provider  atorvastatin (LIPITOR) 40 MG tablet Take 1 tablet (40 mg total) by mouth daily. One tab by mouth once daily Patient taking differently: Take 40 mg by mouth daily.  04/28/14  Yes Fayrene Helper, MD  Calcium-Vitamin D [id number] MG-UNIT TABS Take 1 tablet  by mouth daily.    Yes Historical Provider, MD  Cholecalciferol (VITAMIN D) 1000 UNITS capsule Take 1,000 Units by mouth 2 (two) times daily.    Yes Historical Provider, MD  Choline Fenofibrate (FENOFIBRIC ACID) 135 MG CPDR Take 135 mg by mouth daily. 04/28/14  Yes Fayrene Helper, MD  diltiazem (CARDIZEM CD) 240 MG 24 hr capsule Take 1 capsule (240 mg total) by mouth daily. Patient taking differently: Take 240 mg by mouth every evening.  05/04/14  Yes Troy Sine, MD  DULoxetine (CYMBALTA) 60 MG capsule TAKE ONE CAPSULE BY MOUTH EVERY DAY Patient taking differently: Take 60 mg by mouth every morning.  04/28/14  Yes Fayrene Helper, MD  fentaNYL (DURAGESIC - DOSED MCG/HR) 50 MCG/HR Place 1 patch (50 mcg total) onto the skin every other  day. Change patch every 2 days 10/03/14  Yes Fayrene Helper, MD  fluticasone [facility]) 50 MCG/ACT nasal spray USE 2 SPRAYS IN EACH       NOSTRIL DAILY Patient taking differently: Place 2 sprays into both nostrils daily as needed for allergies or rhinitis.  04/28/14  Yes Fayrene Helper, MD  gabapentin (NEURONTIN) 800 MG tablet TAKE 1 TABLET TWICE A DAY Patient taking differently: Take 800 mg by mouth 2 (two) times daily.  04/28/14  Yes Fayrene Helper, MD  levocetirizine (XYZAL) 5 MG tablet TAKE 1 TABLET BY MOUTH EVERY DAY 04/28/14  Yes Fayrene Helper, MD  levothyroxine (SYNTHROID) 50 MCG tablet Take 1 tablet (50 mcg total) by mouth daily. 04/28/14  Yes Fayrene Helper, MD  LORazepam (ATIVAN) 1 MG tablet TAKE ONE TABLET BY MOUTH ONCE DAILY AT BEDTIME 10/07/14  Yes Fayrene Helper, MD  metoprolol succinate (TOPROL-XL) 100 MG 24 hr tablet Take 1 tablet (100 mg total) by mouth daily. 04/28/14  Yes Fayrene Helper, MD  Multiple Vitamins-Iron (QC DAILY MULTIVITAMINS/IRON PO) Take 1 tablet by mouth once a week.    Yes Historical Provider, MD  omega-3 acid ethyl esters (LOVAZA) 1 G capsule Take 2 capsules (2 g total) by mouth 2 (two) times daily. 04/28/14  Yes Fayrene Helper, MD  oxyCODONE-acetaminophen (PERCOCET) 10-325 MG per tablet One tablet four times daily, as needed, for pain Patient taking differently: Take 1 tablet by mouth 4 (four) times daily as needed for pain.  10/03/14  Yes Fayrene Helper, MD  pantoprazole (PROTONIX) 40 MG tablet TAKE 1 TABLET DAILY Patient taking differently: Take 40 mg by mouth every evening.  04/28/14  Yes Fayrene Helper, MD  raloxifene (EVISTA) 60 MG tablet TAKE 1 TABLET DAILY Patient taking differently: Take 60 mg by mouth daily. TAKE 1 TABLET DAILY 04/28/14  Yes Fayrene Helper, MD  sennosides-docusate sodium (SENOKOT-S) 8.6-50 MG tablet Take 1 tablet by mouth at bedtime. One tablet by mouth at bedtime    Yes Historical Provider, MD   torsemide (DEMADEX) 20 MG tablet Take 1 tablet (20 mg total) by mouth as needed. 08/04/14   Troy Sine, MD   BP 148/103 mmHg  Pulse 58  Temp(Src) 98.7 F (37.1 C) (Oral)  Resp 14  Ht 5\' 5"  (1.651 m)  Wt 197 lb (89.359 kg)  BMI 32.78 kg/m2  SpO2 98% Physical Exam  Constitutional: She is oriented to person, place, and time. She appears well-developed and well-nourished.  Non-toxic appearance.  HENT:  Head: Normocephalic.  Right Ear: Tympanic membrane and external ear normal.  Left Ear: Tympanic membrane and external ear normal.  Eyes:  EOM and lids are normal. Pupils are equal, round, and reactive to light.  Neck: Normal range of motion. Neck supple. Carotid bruit is not present.  Cardiovascular: Normal rate, regular rhythm, intact distal pulses and normal pulses.  Exam reveals no gallop and no friction rub.   Murmur heard. 2/6 systolic murmur appreciated at the apex. No rub or gallop appreciated.  Pulmonary/Chest: Breath sounds normal. No respiratory distress.  Abdominal: Soft. Bowel sounds are normal. There is no tenderness. There is no guarding.  No rib area tenderness, no crepitus, no palpable deformity of the right or left ribs.  Musculoskeletal: Normal range of motion.  There is deformity and swelling of the left ankle. The dorsalis pedis pulses 2+. The Achilles tendon is intact. There is crepitus of the left knee, no effusion appreciated and no deformity. There is good range of motion of the left hip. There is full range of motion of the right lower extremity.  There is pain with change of position or palpation of the lower lumbar area.  Lymphadenopathy:       Head (right side): No submandibular adenopathy present.       Head (left side): No submandibular adenopathy present.    She has no cervical adenopathy.  Neurological: She is alert and oriented to person, place, and time. She has normal strength. No cranial nerve deficit or sensory deficit.  Skin: Skin is warm and  dry.  Psychiatric: She has a normal mood and affect. Her speech is normal.  Nursing note and vitals reviewed.   ED Course  Procedures (including critical care time) Labs Review Labs Reviewed  BASIC METABOLIC PANEL - Abnormal; Notable for the following:    Potassium 3.1 (*)    Glucose, Bld 111 (*)    Creatinine, Ser 1.30 (*)    GFR calc non Af Amer 42 (*)    GFR calc Af Amer 49 (*)    All other components within normal limits  CBC WITH DIFFERENTIAL/PLATELET  URINALYSIS, ROUTINE W REFLEX MICROSCOPIC    Imaging Review Dg Ankle Complete Left  10/25/2014   CLINICAL DATA:  64 year old female with ankle pain and swelling after a fall down stairs last night. Initial encounter.  EXAM: LEFT ANKLE COMPLETE - 3+ VIEW  COMPARISON:  None.  FINDINGS: Diffuse soft tissue swelling. Acute trimalleolar fracture at the left ankle. Mild posterior and lateral subluxation of the mortise joint. fractures are comminuted with minimal to mild displacement. Mild distraction of the medial malleolus fragment. Talar dome intact. Calcaneus intact. No other acute fracture identified.  IMPRESSION: Comminuted trimalleolar fracture at the left ankle with mild lateral and posterior subluxation.   Electronically Signed   By: Genevie Ann M.D.   On: 10/25/2014 13:19   Dg Chest Portable 1 View  10/25/2014   CLINICAL DATA:  [place] down steps last night, injury to left ankle.  EXAM: PORTABLE CHEST - 1 VIEW  COMPARISON:  02/23/2004  FINDINGS: The heart size and mediastinal contours are within normal limits. Both lungs are clear. The visualized skeletal structures are unremarkable.  IMPRESSION: No active disease.   Electronically Signed   By: Rolm Baptise M.D.   On: 10/25/2014 14:37     EKG Interpretation None      MDM  X-ray of the left ankle reveals a comminuted tried malleolar fracture of the left ankle with mild lateral and posterior subluxation.  Capillary refill is less than 2 seconds, dorsalis pedis pulses 2+.  I discussed  the case with Dr. Luna Glasgow.  He will see the patient in the emergency department and admit. I discussed the findings and the plan for admission with the patient, and she is in agreement with this plan.    Final diagnoses:  Trimalleolar fracture of left ankle, closed, initial encounter    *I have reviewed nursing notes, vital signs, and all appropriate lab and imaging results for this patient.**    Lily Kocher, PA-C 10/25/14 [place], PA-C 10/25/14 1730  Davonna Belling, MD 10/26/14 [phone]

## 2014-10-25 NOTE — ED Notes (Signed)
Pt states she fell down the steps last night and injured her L ankle. Edema noted.

## 2014-10-25 NOTE — H&P (Signed)
Regina Horton is an 64 y.o. female.   Chief Complaint: trimalleolar fracture of the left ankle HPI: She fell last night at home and hurt her left ankle.  She had deformity.  She is on pain patch and used that to control her pain.  She has very chronic painful lower back pain.  Today she was no better and came to ER. She has displaced trimalleolar fracture of the left ankle, no other injury.  I have explained need for surgery of the left ankle.  I have discussed risks and imponderables including infection, traumatic arthritis, need for walker for about six to ten weeks, possibility of embolus, and anesthesia problems.  She appears to understand and agrees.  She asked appropriate questions.  Past Medical History  Diagnosis Date  . Allergic rhinitis   . Anxiety   . Depression   . GERD (gastroesophageal reflux disease)   . Hypertension   . Chronic back pain   . Chronic constipation   . Palpitations   . Angina at rest   . Thyroid disease   . CKD (chronic kidney disease)   . Fibromyalgia   . Hypertension   . Hyperlipidemia   . Aortic sclerosis   . Mitral regurgitation   . Tricuspid regurgitation     Past Surgical History  Procedure Laterality Date  . Tonsillectomy  childhood   . Back surgery  2005  . Tubal ligation  1973  . US echocardiography  12/19/2008    mild mitral annular ca+,AOV mildly sclerotic,mild MR & TR  . Nm myocar perf wall motion  02/16/2009    normal  . Breast lumpectomy  1970    right     Family History  Problem Relation Age of Onset  . Rheum arthritis Mother   . Osteoporosis Mother   . Heart disease Father   . Diabetes Father   . Hyperlipidemia Father   . Cancer Father     bladder   . Arthritis Brother   . Heart disease Brother    Social History:  reports that she has quit smoking. She does not have any smokeless tobacco history on file. She reports that she uses illicit drugs (Marijuana). She reports that she does not drink alcohol.  Allergies:    Allergies  Allergen Reactions  . Codeine     Pt states she does not have any problems with codeine.  . Nickel Rash     (Not in a hospital admission)  No results found for this or any previous visit (from the past 48 hour(s)). Dg Ankle Complete Left  10/25/2014   CLINICAL DATA:  64 year old female with ankle pain and swelling after a fall down stairs last night. Initial encounter.  EXAM: LEFT ANKLE COMPLETE - 3+ VIEW  COMPARISON:  None.  FINDINGS: Diffuse soft tissue swelling. Acute trimalleolar fracture at the left ankle. Mild posterior and lateral subluxation of the mortise joint. fractures are comminuted with minimal to mild displacement. Mild distraction of the medial malleolus fragment. Talar dome intact. Calcaneus intact. No other acute fracture identified.  IMPRESSION: Comminuted trimalleolar fracture at the left ankle with mild lateral and posterior subluxation.   Electronically Signed   By: Genevie Ann M.D.   On: 10/25/2014 13:19   Dg Chest Portable 1 View  10/25/2014   CLINICAL DATA:  [place] down steps last night, injury to left ankle.  EXAM: PORTABLE CHEST - 1 VIEW  COMPARISON:  02/23/2004  FINDINGS: The heart size and mediastinal contours are within normal  limits. Both lungs are clear. The visualized skeletal structures are unremarkable.  IMPRESSION: No active disease.   Electronically Signed   By: Rolm Baptise M.D.   On: 10/25/2014 14:37    Review of Systems  Cardiovascular:       Hypertension, heart disease, hyperlipidemia,  Gastrointestinal:       GERD, chronic constipation.  Genitourinary:       Chronic kidney disease  Musculoskeletal:       Chronic severe lower back pain, on patches for this  Endo/Heme/Allergies:       Hypothyroid disease, osteoporosis, vitamin D deficiency  Psychiatric/Behavioral:       Anxiety and depression    Blood pressure 148/103, pulse 58, temperature 98.7 F (37.1 C), temperature source Oral, resp. rate 14, height 5\' 5"  (1.651 m), weight 89.359 kg  (197 lb), SpO2 98 %. Physical Exam  Constitutional: She is oriented to person, place, and time. She appears well-developed and well-nourished.  HENT:  Head: Normocephalic and atraumatic.  Eyes: Conjunctivae and EOM are normal. Pupils are equal, round, and reactive to light.  Neck: Normal range of motion. Neck supple.  Cardiovascular: Normal rate, regular rhythm and intact distal pulses.   Respiratory: Effort normal.  GI: Soft.  Musculoskeletal: She exhibits tenderness (pain and deformity of left ankle, little ability to move it.  NV is intact.).       Feet:  Neurological: She is alert and oriented to person, place, and time. She has normal reflexes.  Skin: Skin is warm and dry.  Psychiatric: She has a normal mood and affect. Her behavior is normal. Judgment and thought content normal.     Assessment/Plan Trimalleolar fracture of the left ankle displaced, for surgery tomorrow. Chronic pain syndrome, chronic back pain, on pain patches Heart disease Hypertension Hypothyroid disease Osteoporosis GERD  Regina Horton 10/25/2014, 3:09 PM

## 2014-10-26 ENCOUNTER — Inpatient Hospital Stay (HOSPITAL_COMMUNITY): Payer: Medicare Other | Admitting: Anesthesiology

## 2014-10-26 ENCOUNTER — Encounter (HOSPITAL_COMMUNITY)
Admission: EM | Disposition: A | Discharge status: Home / Self Care | Payer: Self-pay | Source: Home / Self Care | Attending: Orthopaedic Surgery

## 2014-10-26 ENCOUNTER — Encounter (HOSPITAL_COMMUNITY): Payer: Self-pay | Admitting: *Deleted

## 2014-10-26 ENCOUNTER — Inpatient Hospital Stay (HOSPITAL_COMMUNITY): Payer: Medicare Other

## 2014-10-26 HISTORY — PX: ORIF ANKLE FRACTURE: SHX5408

## 2014-10-26 LAB — URINALYSIS, ROUTINE W REFLEX MICROSCOPIC
Bilirubin Urine: NEGATIVE
GLUCOSE, UA: NEGATIVE mg/dL
Hgb urine dipstick: NEGATIVE
KETONES UR: NEGATIVE mg/dL
Leukocytes, UA: NEGATIVE
Nitrite: NEGATIVE
PROTEIN: NEGATIVE mg/dL
SPECIFIC GRAVITY, URINE: 1.015 (ref 1.005–1.030)
Urobilinogen, UA: 0.2 mg/dL (ref 0.0–1.0)
pH: 5.5 (ref 5.0–8.0)

## 2014-10-26 LAB — POCT I-STAT 4, (NA,K, GLUC, HGB,HCT)
Glucose, Bld: 110 mg/dL — ABNORMAL HIGH (ref 70–99)
HCT: 33 % — ABNORMAL LOW (ref 36.0–46.0)
HEMOGLOBIN: 11.2 g/dL — AB (ref 12.0–15.0)
Potassium: 4.5 mmol/L (ref 3.5–5.1)
Sodium: 143 mmol/L (ref 135–145)

## 2014-10-26 LAB — SURGICAL PCR SCREEN
MRSA, PCR: NEGATIVE
STAPHYLOCOCCUS AUREUS: NEGATIVE

## 2014-10-26 SURGERY — OPEN REDUCTION INTERNAL FIXATION (ORIF) ANKLE FRACTURE
Anesthesia: General | Site: Ankle | Laterality: Left

## 2014-10-26 MED ORDER — FENOFIBRIC ACID 135 MG PO CPDR: 135.0000 mg | DELAYED_RELEASE_CAPSULE | Freq: Every day | ORAL | Status: DC

## 2014-10-26 MED ORDER — ONDANSETRON HCL 4 MG/2ML IJ SOLN
4.0000 mg | Freq: Once | INTRAMUSCULAR | Status: AC
  Administered 2014-10-26: 4 mg via INTRAVENOUS

## 2014-10-26 MED ORDER — FENTANYL CITRATE 0.05 MG/ML IJ SOLN: 25.0000 ug | INTRAMUSCULAR | Status: DC | PRN

## 2014-10-26 MED ORDER — ROCURONIUM BROMIDE 50 MG/5ML IV SOLN
INTRAVENOUS | Status: AC
  Filled 2014-10-26: qty 1

## 2014-10-26 MED ORDER — PROPOFOL 10 MG/ML IV BOLUS
INTRAVENOUS | Status: DC | PRN
  Administered 2014-10-26: 20 mg via INTRAVENOUS
  Administered 2014-10-26: 120 mg via INTRAVENOUS

## 2014-10-26 MED ORDER — DILTIAZEM HCL ER COATED BEADS 240 MG PO CP24
240.0000 mg | ORAL_CAPSULE | Freq: Every day | ORAL | Status: DC
  Administered 2014-10-26 – 2014-10-28 (×3): 240 mg via ORAL
  Filled 2014-10-26 (×3): qty 1

## 2014-10-26 MED ORDER — HYDROMORPHONE HCL 2 MG PO TABS
ORAL_TABLET | ORAL | Status: AC
  Filled 2014-10-26: qty 1

## 2014-10-26 MED ORDER — MIDAZOLAM HCL 2 MG/2ML IJ SOLN
1.0000 mg | INTRAMUSCULAR | Status: DC | PRN
  Administered 2014-10-26 (×2): 2 mg via INTRAVENOUS

## 2014-10-26 MED ORDER — CEFAZOLIN SODIUM-DEXTROSE 2-3 GM-% IV SOLR
INTRAVENOUS | Status: AC
  Filled 2014-10-26: qty 50

## 2014-10-26 MED ORDER — ALBUTEROL SULFATE (2.5 MG/3ML) 0.083% IN NEBU
INHALATION_SOLUTION | RESPIRATORY_TRACT | Status: AC
  Filled 2014-10-26: qty 3

## 2014-10-26 MED ORDER — HYDROMORPHONE HCL 1 MG/ML IJ SOLN
0.5000 mg | INTRAMUSCULAR | Status: DC | PRN
  Administered 2014-10-26 (×2): 0.5 mg via INTRAVENOUS

## 2014-10-26 MED ORDER — ATORVASTATIN CALCIUM 40 MG PO TABS
40.0000 mg | ORAL_TABLET | Freq: Every day | ORAL | Status: DC
  Administered 2014-10-26 – 2014-10-27 (×2): 40 mg via ORAL
  Filled 2014-10-26 (×2): qty 1

## 2014-10-26 MED ORDER — SODIUM CHLORIDE 0.9 % IJ SOLN
INTRAMUSCULAR | Status: AC
  Filled 2014-10-26: qty 20

## 2014-10-26 MED ORDER — METOPROLOL SUCCINATE ER 50 MG PO TB24
100.0000 mg | ORAL_TABLET | Freq: Every day | ORAL | Status: DC
Start: 2014-10-26 — End: 2014-10-28
  Administered 2014-10-26 – 2014-10-28 (×3): 100 mg via ORAL
  Filled 2014-10-26 (×3): qty 2

## 2014-10-26 MED ORDER — OMEGA-3-ACID ETHYL ESTERS 1 G PO CAPS
2.0000 g | ORAL_CAPSULE | Freq: Two times a day (BID) | ORAL | Status: DC
  Administered 2014-10-26 – 2014-10-28 (×4): 2 g via ORAL
  Filled 2014-10-26 (×4): qty 2

## 2014-10-26 MED ORDER — CALCIUM CARBONATE-VITAMIN D 500-200 MG-UNIT PO TABS
1.0000 | ORAL_TABLET | Freq: Every day | ORAL | Status: DC
  Administered 2014-10-26 – 2014-10-28 (×3): 1 via ORAL
  Filled 2014-10-26 (×3): qty 1

## 2014-10-26 MED ORDER — SUFENTANIL CITRATE 50 MCG/ML IV SOLN
INTRAVENOUS | Status: AC
  Filled 2014-10-26: qty 1

## 2014-10-26 MED ORDER — GLYCOPYRROLATE 0.2 MG/ML IJ SOLN
INTRAMUSCULAR | Status: DC | PRN
  Administered 2014-10-26: 0.6 mg via INTRAVENOUS

## 2014-10-26 MED ORDER — KCL-LACTATED RINGERS 20 MEQ/L IV SOLN
INTRAVENOUS | Status: DC
  Administered 2014-10-26: 09:00:00 via INTRAVENOUS
  Filled 2014-10-26 (×7): qty 1000

## 2014-10-26 MED ORDER — RALOXIFENE HCL 60 MG PO TABS
60.0000 mg | ORAL_TABLET | Freq: Every day | ORAL | Status: DC
  Administered 2014-10-27 – 2014-10-28 (×2): 60 mg via ORAL
  Filled 2014-10-26 (×3): qty 1

## 2014-10-26 MED ORDER — FENTANYL CITRATE 0.05 MG/ML IJ SOLN
25.0000 ug | INTRAMUSCULAR | Status: AC
  Administered 2014-10-26 (×2): 25 ug via INTRAVENOUS

## 2014-10-26 MED ORDER — LIDOCAINE HCL (PF) 1 % IJ SOLN
INTRAMUSCULAR | Status: AC
  Filled 2014-10-26: qty 5

## 2014-10-26 MED ORDER — LEVOTHYROXINE SODIUM 50 MCG PO TABS
50.0000 ug | ORAL_TABLET | Freq: Every day | ORAL | Status: DC
  Administered 2014-10-27 – 2014-10-28 (×2): 50 ug via ORAL
  Filled 2014-10-26 (×3): qty 1

## 2014-10-26 MED ORDER — LEVOCETIRIZINE DIHYDROCHLORIDE 5 MG PO TABS: 5.0000 mg | ORAL_TABLET | Freq: Every evening | ORAL | Status: DC

## 2014-10-26 MED ORDER — ROCURONIUM BROMIDE 100 MG/10ML IV SOLN
INTRAVENOUS | Status: DC | PRN
  Administered 2014-10-26: 5 mg via INTRAVENOUS
  Administered 2014-10-26: 25 mg via INTRAVENOUS

## 2014-10-26 MED ORDER — LORAZEPAM 1 MG PO TABS
1.0000 mg | ORAL_TABLET | Freq: Every day | ORAL | Status: DC
  Administered 2014-10-27 – 2014-10-28 (×2): 1 mg via ORAL
  Filled 2014-10-26 (×2): qty 1

## 2014-10-26 MED ORDER — HYDROMORPHONE HCL 1 MG/ML IJ SOLN
INTRAMUSCULAR | Status: AC
  Filled 2014-10-26: qty 1

## 2014-10-26 MED ORDER — SUCCINYLCHOLINE CHLORIDE 20 MG/ML IJ SOLN
INTRAMUSCULAR | Status: AC
  Filled 2014-10-26: qty 1

## 2014-10-26 MED ORDER — LIDOCAINE HCL (CARDIAC) 20 MG/ML IV SOLN
INTRAVENOUS | Status: DC | PRN
  Administered 2014-10-26: 50 mg via INTRAVENOUS

## 2014-10-26 MED ORDER — LACTATED RINGERS IV SOLN
INTRAVENOUS | Status: DC | PRN
  Administered 2014-10-26 (×2): via INTRAVENOUS

## 2014-10-26 MED ORDER — NEOSTIGMINE METHYLSULFATE 10 MG/10ML IV SOLN
INTRAVENOUS | Status: DC | PRN
  Administered 2014-10-26: 4 mg via INTRAVENOUS

## 2014-10-26 MED ORDER — ALBUTEROL SULFATE (2.5 MG/3ML) 0.083% IN NEBU
2.5000 mg | INHALATION_SOLUTION | Freq: Four times a day (QID) | RESPIRATORY_TRACT | Status: DC
  Administered 2014-10-26 – 2014-10-27 (×2): 2.5 mg via RESPIRATORY_TRACT
  Filled 2014-10-26: qty 3

## 2014-10-26 MED ORDER — MIDAZOLAM HCL 2 MG/2ML IJ SOLN
INTRAMUSCULAR | Status: AC
  Filled 2014-10-26: qty 2

## 2014-10-26 MED ORDER — VITAMIN D 1000 UNITS PO TABS
1000.0000 [IU] | ORAL_TABLET | Freq: Two times a day (BID) | ORAL | Status: DC
  Administered 2014-10-26 – 2014-10-28 (×4): 1000 [IU] via ORAL
  Filled 2014-10-26 (×4): qty 1

## 2014-10-26 MED ORDER — ONDANSETRON HCL 4 MG/2ML IJ SOLN: 4.0000 mg | Freq: Once | INTRAMUSCULAR | Status: DC | PRN

## 2014-10-26 MED ORDER — LORATADINE 10 MG PO TABS
10.0000 mg | ORAL_TABLET | Freq: Every day | ORAL | Status: DC
  Administered 2014-10-27 – 2014-10-28 (×2): 10 mg via ORAL
  Filled 2014-10-26 (×2): qty 1

## 2014-10-26 MED ORDER — LACTATED RINGERS IV SOLN
INTRAVENOUS | Status: DC
  Administered 2014-10-26: 11:00:00 via INTRAVENOUS

## 2014-10-26 MED ORDER — PROPOFOL 10 MG/ML IV BOLUS
INTRAVENOUS | Status: AC
  Filled 2014-10-26: qty 20

## 2014-10-26 MED ORDER — SODIUM CHLORIDE 0.9 % IR SOLN
Status: DC | PRN
  Administered 2014-10-26: 1000 mL

## 2014-10-26 MED ORDER — SUCCINYLCHOLINE CHLORIDE 20 MG/ML IJ SOLN
INTRAMUSCULAR | Status: DC | PRN
  Administered 2014-10-26: 120 mg via INTRAVENOUS

## 2014-10-26 MED ORDER — FLUTICASONE PROPIONATE 50 MCG/ACT NA SUSP: 2.0000 | Freq: Every day | NASAL | Status: DC | PRN

## 2014-10-26 MED ORDER — PHENYLEPHRINE HCL 10 MG/ML IJ SOLN
INTRAMUSCULAR | Status: AC
  Filled 2014-10-26: qty 1

## 2014-10-26 MED ORDER — PANTOPRAZOLE SODIUM 40 MG PO TBEC
40.0000 mg | DELAYED_RELEASE_TABLET | Freq: Every evening | ORAL | Status: DC
  Administered 2014-10-26 – 2014-10-27 (×2): 40 mg via ORAL
  Filled 2014-10-26 (×3): qty 1

## 2014-10-26 MED ORDER — POTASSIUM CHLORIDE 2 MEQ/ML IV SOLN
INTRAVENOUS | Status: DC
  Administered 2014-10-26 – 2014-10-27 (×2): via INTRAVENOUS
  Filled 2014-10-26 (×4): qty 1000

## 2014-10-26 MED ORDER — SENNOSIDES-DOCUSATE SODIUM 8.6-50 MG PO TABS
1.0000 | ORAL_TABLET | Freq: Every day | ORAL | Status: DC
  Administered 2014-10-26 – 2014-10-27 (×2): 1 via ORAL
  Filled 2014-10-26 (×2): qty 1

## 2014-10-26 MED ORDER — FENOFIBRATE 160 MG PO TABS
160.0000 mg | ORAL_TABLET | Freq: Every day | ORAL | Status: DC
  Administered 2014-10-27 – 2014-10-28 (×2): 160 mg via ORAL
  Filled 2014-10-26 (×2): qty 1

## 2014-10-26 MED ORDER — DULOXETINE HCL 60 MG PO CPEP
60.0000 mg | ORAL_CAPSULE | Freq: Every morning | ORAL | Status: DC
Start: 2014-10-27 — End: 2014-10-28
  Administered 2014-10-27 – 2014-10-28 (×2): 60 mg via ORAL
  Filled 2014-10-26 (×2): qty 1

## 2014-10-26 MED ORDER — SUFENTANIL CITRATE 50 MCG/ML IV SOLN
INTRAVENOUS | Status: DC | PRN
  Administered 2014-10-26 (×5): 10 ug via INTRAVENOUS

## 2014-10-26 MED ORDER — GABAPENTIN 400 MG PO CAPS
800.0000 mg | ORAL_CAPSULE | Freq: Two times a day (BID) | ORAL | Status: DC
  Administered 2014-10-26 – 2014-10-28 (×4): 800 mg via ORAL
  Filled 2014-10-26 (×4): qty 2

## 2014-10-26 MED ORDER — ADULT MULTIVITAMIN W/MINERALS CH
1.0000 | ORAL_TABLET | Freq: Every day | ORAL | Status: DC
  Administered 2014-10-27 – 2014-10-28 (×2): 1 via ORAL
  Filled 2014-10-26 (×3): qty 1

## 2014-10-26 MED ORDER — ONDANSETRON HCL 4 MG/2ML IJ SOLN
INTRAMUSCULAR | Status: AC
  Filled 2014-10-26: qty 2

## 2014-10-26 MED ORDER — ENOXAPARIN SODIUM 40 MG/0.4ML ~~LOC~~ SOLN
40.0000 mg | SUBCUTANEOUS | Status: DC
  Administered 2014-10-27 – 2014-10-28 (×2): 40 mg via SUBCUTANEOUS
  Filled 2014-10-26 (×2): qty 0.4

## 2014-10-26 MED ORDER — FENTANYL CITRATE 0.05 MG/ML IJ SOLN
INTRAMUSCULAR | Status: AC
  Filled 2014-10-26: qty 2

## 2014-10-26 SURGICAL SUPPLY — 60 items
BAG HAMPER (MISCELLANEOUS) ×2 IMPLANT
BANDAGE ELASTIC 4 VELCRO NS (GAUZE/BANDAGES/DRESSINGS) ×2 IMPLANT
BANDAGE ELASTIC 6 VELCRO NS (GAUZE/BANDAGES/DRESSINGS) ×1 IMPLANT
BANDAGE ESMARK 4X12 BL STRL LF (DISPOSABLE) ×1 IMPLANT
BIT DRILL 2.5X110 QC LCP DISP (BIT) ×4 IMPLANT
BIT DRILL 2.8 (BIT)
BIT DRILL CANN QC 2.8X165 (BIT) IMPLANT
BIT DRILL JC END 3.2X130 (BIT) IMPLANT
BIT DRILL QC 3.5X110 (BIT) ×1 IMPLANT
BLADE 10 SAFETY STRL DISP (BLADE) ×1 IMPLANT
BLADE 15 SAFETY STRL DISP (BLADE) ×1 IMPLANT
BNDG CMPR 12X4 ELC STRL LF (DISPOSABLE) ×1
BNDG ESMARK 4X12 BLUE STRL LF (DISPOSABLE) ×2
CLOTH BEACON ORANGE TIMEOUT ST (SAFETY) ×2 IMPLANT
COVER LIGHT HANDLE STERIS (MISCELLANEOUS) ×4 IMPLANT
COVER MAYO STAND XLG (DRAPE) ×2 IMPLANT
CUFF TOURNIQUET SINGLE 34IN LL (TOURNIQUET CUFF) ×2 IMPLANT
DRAPE C-ARM FOLDED MOBILE STRL (DRAPES) ×1 IMPLANT
DRILL BIT 2.8MM (BIT)
DURAPREP 26ML APPLICATOR (WOUND CARE) ×2 IMPLANT
GAUZE SPONGE 4X4 12PLY STRL (GAUZE/BANDAGES/DRESSINGS) ×2 IMPLANT
GAUZE XEROFORM 5X9 LF (GAUZE/BANDAGES/DRESSINGS) ×2 IMPLANT
GLOVE BIO SURGEON STRL SZ8 (GLOVE) ×2 IMPLANT
GLOVE BIO SURGEON STRL SZ8.5 (GLOVE) ×2 IMPLANT
GLOVE BIOGEL M 7.0 STRL (GLOVE) ×1 IMPLANT
GLOVE BIOGEL PI IND STRL 7.0 (GLOVE) IMPLANT
GLOVE BIOGEL PI INDICATOR 7.0 (GLOVE) ×2
GLOVE ECLIPSE 6.5 STRL STRAW (GLOVE) ×1 IMPLANT
GOWN STRL REUS W/TWL LRG LVL3 (GOWN DISPOSABLE) ×6 IMPLANT
GOWN STRL REUS W/TWL XL LVL3 (GOWN DISPOSABLE) ×2 IMPLANT
INST SET MINOR BONE (KITS) ×2 IMPLANT
K-WIRE 1.6 (WIRE) ×2
K-WIRE FX5X1.6XNS BN SS (WIRE) ×1
KIT ROOM TURNOVER APOR (KITS) ×2 IMPLANT
KWIRE FX5X1.6XNS BN SS (WIRE) ×1 IMPLANT
MANIFOLD NEPTUNE II (INSTRUMENTS) ×2 IMPLANT
NS IRRIG 1000ML POUR BTL (IV SOLUTION) ×2 IMPLANT
PACK BASIC LIMB (CUSTOM PROCEDURE TRAY) ×2 IMPLANT
PAD ABD 5X9 TENDERSORB (GAUZE/BANDAGES/DRESSINGS) ×5 IMPLANT
PAD ARMBOARD 7.5X6 YLW CONV (MISCELLANEOUS) ×2 IMPLANT
PAD CAST 4YDX4 CTTN HI CHSV (CAST SUPPLIES) ×1 IMPLANT
PADDING CAST COTTON 4X4 STRL (CAST SUPPLIES) ×2
PADDING WEBRIL 6 STERILE (GAUZE/BANDAGES/DRESSINGS) ×1 IMPLANT
PLATE W-COLLAR 6MM (Plate) ×1 IMPLANT
SCREW 3.5MM 14MM (Screw) ×3 IMPLANT
SCREW 3.5MM 18MM (Screw) ×1 IMPLANT
SCREW 3.5MM 20MM (Screw) ×1 IMPLANT
SCREW BONE TI FP 4.0MM ANKLE (Screw) ×1 IMPLANT
SCREW CANC TI FT 4.0X14 (Screw) ×1 IMPLANT
SCREW CANC TI PT 4.0X45 (Screw) ×1 IMPLANT
SET BASIN LINEN APH (SET/KITS/TRAYS/PACK) ×2 IMPLANT
SPLINT J 5 (CAST SUPPLIES) ×1 IMPLANT
SPONGE GAUZE 4X4 12PLY (GAUZE/BANDAGES/DRESSINGS) ×1 IMPLANT
SPONGE LAP 18X18 X RAY DECT (DISPOSABLE) ×2 IMPLANT
STAPLER VISISTAT 35W (STAPLE) ×1 IMPLANT
SUT CHROMIC 2 0 CT 1 (SUTURE) ×2 IMPLANT
SUT NUROLON CT 2 BLK #1 18IN (SUTURE) IMPLANT
SUT PLAIN 2 0 XLH (SUTURE) ×2 IMPLANT
TOWEL OR 17X26 4PK STRL BLUE (TOWEL DISPOSABLE) ×2 IMPLANT
WIRE 1.25MMX150MM ×1 IMPLANT

## 2014-10-26 NOTE — OR Nursing (Signed)
Noticed patient allergy to nickle, went to preop to talk with patient and specifically asked about stainless steel, patient noted allergy to stainless steel earrings & says she has titanium on her glasses and does not have any problems with that.  After discussing with Dr. Luna Glasgow and Marquita Palms, we requested a titanium tray for implants to be brought to [facility] from [facility].  Patient notified, case delayed to wait on tray for implants.  After tray arrived, room opened and case proceeded without any difficulty noted.

## 2014-10-26 NOTE — Anesthesia Postprocedure Evaluation (Signed)
  Anesthesia Post-op Note  Patient: Regina Horton  Procedure(s) Performed: Procedure(s): OPEN REDUCTION INTERNAL FIXATION (ORIF) ANKLE FRACTURE (Left)  Patient Location: PACU  Anesthesia Type:General  Level of Consciousness: awake, alert , oriented and patient cooperative  Airway and Oxygen Therapy: Patient Spontanous Breathing and Patient connected to nasal cannula oxygen  Post-op Pain: 3 /10, mild  Post-op Assessment: Post-op Vital signs reviewed, Patient's Cardiovascular Status Stable, Respiratory Function Stable, Patent Airway, No signs of Nausea or vomiting and No headache  Post-op Vital Signs: Reviewed and stable  Last Vitals:  Filed Vitals:   10/26/14 1225  BP: 135/111  Pulse:   Temp:   Resp: 35    Complications: No apparent anesthesia complications

## 2014-10-26 NOTE — Anesthesia Procedure Notes (Signed)
Procedure Name: Intubation Date/Time: 10/26/2014 12:40 PM Performed by: Andree Elk, Yanessa Hocevar A Pre-anesthesia Checklist: Patient identified, Patient being monitored, Timeout performed, Emergency Drugs available and Suction available Patient Re-evaluated:Patient Re-evaluated prior to inductionOxygen Delivery Method: Circle System Utilized Preoxygenation: Pre-oxygenation with 100% oxygen Intubation Type: IV induction, Rapid sequence and Cricoid Pressure applied Laryngoscope Size: 3 and Miller Grade View: Grade I Tube type: Oral Tube size: 7.0 mm Number of attempts: 1 Airway Equipment and Method: Stylet Placement Confirmation: ETT inserted through vocal cords under direct vision,  positive ETCO2 and breath sounds checked- equal and bilateral Secured at: 21 cm Tube secured with: Tape Dental Injury: Teeth and Oropharynx as per pre-operative assessment

## 2014-10-26 NOTE — Transfer of Care (Signed)
Immediate Anesthesia Transfer of Care Note  Patient: Regina Horton  Procedure(s) Performed: Procedure(s): OPEN REDUCTION INTERNAL FIXATION (ORIF) ANKLE FRACTURE (Left)  Patient Location: PACU  Anesthesia Type:General  Level of Consciousness: awake, alert  and patient cooperative  Airway & Oxygen Therapy: Patient Spontanous Breathing and Patient connected to face mask oxygen  Post-op Assessment: Report given to RN and Post -op Vital signs reviewed and stable  Post vital signs: Reviewed and stable  Last Vitals:  Filed Vitals:   10/26/14 1225  BP: 135/111  Pulse:   Temp:   Resp: 35    Complications: No apparent anesthesia complications

## 2014-10-26 NOTE — Care Management Utilization Note (Signed)
UR completed, 

## 2014-10-26 NOTE — Anesthesia Preprocedure Evaluation (Signed)
Anesthesia Evaluation  Patient identified by MRN, date of birth, ID band Patient awake    Reviewed: Allergy & Precautions, NPO status , Patient's Chart, lab work & pertinent test results  Airway Mallampati: I  TM Distance: >3 FB     Dental  (+) Teeth Intact   Pulmonary former smoker,  breath sounds clear to auscultation        Cardiovascular hypertension, Pt. on medications + angina (none recently) + Valvular Problems/Murmurs AS and MR Rhythm:Regular Rate:Normal     Neuro/Psych PSYCHIATRIC DISORDERS Anxiety Depression    GI/Hepatic GERD-  Controlled and Medicated,  Endo/Other  Hypothyroidism   Renal/GU      Musculoskeletal  (+) Fibromyalgia -, narcotic dependent  Abdominal   Peds  Hematology   Anesthesia Other Findings Chronic pain - fentanyl patch  Reproductive/Obstetrics                             Anesthesia Physical Anesthesia Plan  ASA: III  Anesthesia Plan: General   Post-op Pain Management:    Induction: Intravenous, Rapid sequence and Cricoid pressure planned  Airway Management Planned: Oral ETT  Additional Equipment:   Intra-op Plan:   Post-operative Plan: Extubation in OR  Informed Consent: I have reviewed the patients History and Physical, chart, labs and discussed the procedure including the risks, benefits and alternatives for the proposed anesthesia with the patient or authorized representative who has indicated his/her understanding and acceptance.     Plan Discussed with:   Anesthesia Plan Comments:         Anesthesia Quick Evaluation

## 2014-10-26 NOTE — Brief Op Note (Signed)
10/25/2014 - 10/26/2014  1:48 PM  PATIENT:  Regina Horton  64 y.o. female  PRE-OPERATIVE DIAGNOSIS:  trimalleolar fracture left ankle  POST-OPERATIVE DIAGNOSIS:  trimalleolar fracture left ankle  PROCEDURE:  Procedure(s): OPEN REDUCTION INTERNAL FIXATION (ORIF) ANKLE FRACTURE (Left)  SURGEON:  Surgeon(s) and Role:    * Sanjuana Kava, MD - Primary  PHYSICIAN ASSISTANT:   ASSISTANTS: C. Jeffie Pollock, RN   ANESTHESIA:   general  EBL:  Total I/O In: 1000 [I.V.:1000] Out: 10 [Blood:10]  BLOOD ADMINISTERED:none  DRAINS: none   LOCAL MEDICATIONS USED:  NONE  SPECIMEN:  No Specimen  DISPOSITION OF SPECIMEN:  N/A  COUNTS:  YES  TOURNIQUET:  47 minutes  DICTATION: .Other Dictation: Dictation Number [id number]  PLAN OF CARE: Admit to inpatient   PATIENT DISPOSITION:  PACU - hemodynamically stable.   Delay start of Pharmacological VTE agent (>24hrs) due to surgical blood loss or risk of bleeding: no

## 2014-10-27 ENCOUNTER — Ambulatory Visit: Payer: Medicare Other | Admitting: Cardiovascular Disease

## 2014-10-27 ENCOUNTER — Encounter (HOSPITAL_COMMUNITY): Payer: Self-pay | Admitting: Orthopaedic Surgery

## 2014-10-27 LAB — BASIC METABOLIC PANEL
Anion gap: 7 (ref 5–15)
BUN: 10 mg/dL (ref 6–23)
CHLORIDE: 108 mmol/L (ref 96–112)
CO2: 27 mmol/L (ref 19–32)
CREATININE: 1.11 mg/dL — AB (ref 0.50–1.10)
Calcium: 9.2 mg/dL (ref 8.4–10.5)
GFR calc Af Amer: 59 mL/min — ABNORMAL LOW (ref >90)
GFR calc non Af Amer: 51 mL/min — ABNORMAL LOW (ref >90)
Glucose, Bld: 93 mg/dL (ref 70–99)
Potassium: 3.4 mmol/L — ABNORMAL LOW (ref 3.5–5.1)
SODIUM: 142 mmol/L (ref 135–145)

## 2014-10-27 LAB — CBC WITH DIFFERENTIAL/PLATELET
BASOS ABS: 0 10*3/uL (ref 0.0–0.1)
BASOS PCT: 0 % (ref 0–1)
Eosinophils Absolute: 0 10*3/uL (ref 0.0–0.7)
Eosinophils Relative: 0 % (ref 0–5)
HCT: 29.1 % — ABNORMAL LOW (ref 36.0–46.0)
Hemoglobin: 9.7 g/dL — ABNORMAL LOW (ref 12.0–15.0)
Lymphocytes Relative: 19 % (ref 12–46)
Lymphs Abs: 1.6 10*3/uL (ref 0.7–4.0)
MCH: 31.5 pg (ref 26.0–34.0)
MCHC: 33.3 g/dL (ref 30.0–36.0)
MCV: 94.5 fL (ref 78.0–100.0)
MONO ABS: 0.8 10*3/uL (ref 0.1–1.0)
Monocytes Relative: 10 % (ref 3–12)
NEUTROS ABS: 5.7 10*3/uL (ref 1.7–7.7)
Neutrophils Relative %: 71 % (ref 43–77)
Platelets: 254 10*3/uL (ref 150–400)
RBC: 3.08 MIL/uL — AB (ref 3.87–5.11)
RDW: 13 % (ref 11.5–15.5)
WBC: 8.1 10*3/uL (ref 4.0–10.5)

## 2014-10-27 MED ORDER — DEXTROSE-NACL 5-0.45 % IV SOLN
INTRAVENOUS | Status: DC
  Administered 2014-10-27: 19:00:00 via INTRAVENOUS

## 2014-10-27 MED ORDER — FENTANYL 50 MCG/HR TD PT72
50.0000 ug | MEDICATED_PATCH | TRANSDERMAL | Status: DC
  Administered 2014-10-27: 50 ug via TRANSDERMAL
  Filled 2014-10-27: qty 1

## 2014-10-27 NOTE — Anesthesia Postprocedure Evaluation (Signed)
  Anesthesia Post-op Note  Patient: Regina Horton  Procedure(s) Performed: Procedure(s): OPEN REDUCTION INTERNAL FIXATION (ORIF) ANKLE FRACTURE (Left)  Patient Location: room 315  Anesthesia Type:General  Level of Consciousness: awake, alert , oriented and patient cooperative  Airway and Oxygen Therapy: Patient Spontanous Breathing and Patient connected to nasal cannula oxygen  Post-op Pain: 7 /10, moderate  Post-op Assessment: Post-op Vital signs reviewed, Patient's Cardiovascular Status Stable, Respiratory Function Stable, Patent Airway, No signs of Nausea or vomiting and Pain level not controlled  Post-op Vital Signs: Reviewed and stable  Last Vitals:  Filed Vitals:   10/27/14 0431  BP: 110/81  Pulse: 73  Temp: 37.8 C  Resp: 14    Complications: No apparent anesthesia complications

## 2014-10-27 NOTE — Progress Notes (Signed)
Brought patient Advance Directives information and discussed. She stated she had completed a Industry and she now wants to redo it. We discussed that she would need to remove all those copies and then complete a new one. She stated she wasn't feeling well enough to begin the completing process today.  Plan to follow up tomorrow.

## 2014-10-27 NOTE — Addendum Note (Signed)
Addendum  created 10/27/14 0951 by Charmaine Downs, CRNA   Modules edited: Notes Section   Notes Section:  File: [id number]

## 2014-10-27 NOTE — Progress Notes (Signed)
Subjective: 1 Day Post-Op Procedure(s) (LRB): OPEN REDUCTION INTERNAL FIXATION (ORIF) ANKLE FRACTURE (Left) Patient reports pain as 6 on 0-10 scale.    Objective: Vital signs in last 24 hours: Temp:  [98 F (36.7 C)-100 F (37.8 C)] 100 F (37.8 C) (03/10 0431) Pulse Rate:  [58-90] 73 (03/10 0431) Resp:  [13-35] 14 (03/10 0431) BP: (79-143)/(37-111) 110/81 mmHg (03/10 0431) SpO2:  [94 %-100 %] 98 % (03/10 0431) Weight:  [89.359 kg (197 lb)] 89.359 kg (197 lb) (03/09 1102)  Intake/Output from previous day: 03/09 0701 - 03/10 0700 In: 2325 [I.V.:2325] Out: 910 [Urine:900; Blood:10] Intake/Output this shift:     Recent Labs  10/25/14 1450 10/26/14 1104 10/27/14 0535  HGB 12.3 11.2* 9.7*    Recent Labs  10/25/14 1450 10/26/14 1104 10/27/14 0535  WBC 9.2  --  8.1  RBC 3.98  --  3.08*  HCT 37.8 33.0* 29.1*  PLT 305  --  254    Recent Labs  10/25/14 1450 10/26/14 1104  NA 142 143  K 3.1* 4.5  CL 107  --   CO2 25  --   BUN 15  --   CREATININE 1.30*  --   GLUCOSE 111* 110*  CALCIUM 9.6  --    No results for input(s): LABPT, INR in the last 72 hours.  Neurologically intact Neurovascular intact Sensation intact distally Intact pulses distally   She is complaining of pain.  I will resume her pain patch today.  She will have PT today.  Hopefully she can be discharged tomorrow with home health.  Assessment/Plan: 1 Day Post-Op Procedure(s) (LRB): OPEN REDUCTION INTERNAL FIXATION (ORIF) ANKLE FRACTURE (Left) Up with therapy  Regina Horton 10/27/2014, 7:24 AM

## 2014-10-27 NOTE — Evaluation (Addendum)
Physical Therapy Evaluation Patient Details Name: Regina Horton MRN: [id number] DOB: 06/29/1951 Today's Date: 10/27/2014   History of Present Illness  HPI: She fell last night at home and hurt her left ankle. She had deformity. She is on pain patch and used that to control her pain. She has very chronic painful lower back pain. Today she was no better and came to ER. She has displaced trimalleolar fracture of the left ankle, no other injury.  Clinical Impression  Pt lives with husband, has multiple health problems.  She is very sedentary at home because of this and states that walking with a walker is very difficult for her because of a "bad left shoulder".  She is very pleasant and cooperative, obese.  She was able to transfer to EOB and use a walker to pivot bed to chair with supervision.  I doubt that she will be able to functionally ambulate TTWB on Left and will concentrate on safe transfers.  She will need a w/c with elevating leg rest to mobilize in the home.    Follow Up Recommendations Home health PT    Equipment Recommendations  Wheelchair (measurements PT)    Recommendations for Other Services   none    Precautions / Restrictions Precautions Precautions: Fall Restrictions Weight Bearing Restrictions: Yes   TTWB left      Mobility  Bed Mobility Overal bed mobility: Modified Independent                Transfers Overall transfer level: Needs assistance Equipment used: Rolling walker (2 wheeled) Transfers: Sit to/from [employer] Transfers Sit to Stand: Supervision Stand pivot transfers: Supervision          Ambulation/Gait Ambulation/Gait assistance:  (unable to ambulate)              Stairs            Wheelchair Mobility    Modified Rankin (Stroke Patients Only)       Balance Overall balance assessment: No apparent balance deficits (not formally assessed)                                            Pertinent Vitals/Pain Pain Assessment: 0-10 Pain Score: 7  Pain Descriptors / Indicators: Aching Pain Intervention(s): Limited activity within patient's tolerance;PCA encouraged;Patient requesting pain meds-RN notified;Repositioned    Home Living Family/patient expects to be discharged to:: Private residence Living Arrangements: Spouse/significant other Available Help at Discharge: Family;Available 24 hours/day Type of Home: House Home Access: Level entry (to ground floor)     Home Layout: Two level;Able to live on main level with bedroom/bathroom Home Equipment: Gilford Rile - 2 wheels;Cane - single point;Bedside commode      Prior Function Level of Independence: Independent with assistive device(s)         Comments: uses a cane     Hand Dominance   Dominant Hand: Right    Extremity/Trunk Assessment               Lower Extremity Assessment: Generalized weakness         Communication   Communication: No difficulties  Cognition Arousal/Alertness: Awake/alert Behavior During Therapy: WFL for tasks assessed/performed Overall Cognitive Status: Within Functional Limits for tasks assessed                      General Comments  Exercises        Assessment/Plan    PT Assessment    PT Diagnosis Difficulty walking;Acute pain   PT Problem List    PT Treatment Interventions     PT Goals (Current goals can be found in the Care Plan section) Acute Rehab PT Goals Patient Stated Goal: none stated PT Goal Formulation: With patient Time For Goal Achievement: 11/03/14 Potential to Achieve Goals: Good    Frequency     Barriers to discharge        Co-evaluation               End of Session Equipment Utilized During Treatment: Gait belt Activity Tolerance: Patient tolerated treatment well;Patient limited by pain Patient left: in chair;with call bell/phone within reach Nurse Communication: Mobility status         Time:  0826-0906 PT Time Calculation (min) (ACUTE ONLY): 40 min   Charges:   PT Evaluation $Initial PT Evaluation Tier I: 1 Procedure     PT G CodesDemetrios Horton L 10/27/2014, 9:13 AM

## 2014-10-27 NOTE — Progress Notes (Signed)
Physical Therapy Treatment Patient Details Name: Regina Horton MRN: [id number] DOB: 11/12/1950 Today's Date: 10/27/2014    History of Present Illness HPI: She fell last night at home and hurt her left ankle. She had deformity. She is on pain patch and used that to control her pain. She has very chronic painful lower back pain. Today she was no better and came to ER. She has displaced trimalleolar fracture of the left ankle, no other injury.    PT Comments    Pt tolerated sitting in recliner x 2 hours.  She was instructed in chair to [facility] using a squat pivot transfer needing min assist. She was able to use a walker to pivot BSC to bed maintaining TTWB left.  Pt states that at home she stays in bed all day long normally because of multiple medical problems.  Husband present during my visit and observed transfer techniques.  She should be able to transition to home at d/c.  Follow Up Recommendations  Home health PT     Equipment Recommendations  Wheelchair (measurements PT) (with elevating legrest)    Recommendations for Other Services  none     Precautions / Restrictions Precautions Precautions: Fall Restrictions Weight Bearing Restrictions: Yes LLE Weight Bearing: Touchdown weight bearing    Mobility  Bed Mobility Overal bed mobility: Modified Independent                Transfers Overall transfer level: Needs assistance Equipment used: Rolling walker (2 wheeled) Transfers: Sit to/from Stand;Squat Pivot Transfers;Stand Pivot Transfers Sit to Stand: Supervision Stand pivot transfers: Supervision Squat pivot transfers: Min assist     General transfer comment: instructed in a pivot transfer from chair to [facility]  Ambulation/Gait Ambulation/Gait assistance:  (unable)               Stairs            Wheelchair Mobility    Modified Rankin (Stroke Patients Only)       Balance Overall balance assessment: No apparent balance deficits (not formally  assessed)                                  Cognition Arousal/Alertness: Awake/alert Behavior During Therapy: WFL for tasks assessed/performed Overall Cognitive Status: Within Functional Limits for tasks assessed                      Exercises      General Comments        Pertinent Vitals/Pain Pain Assessment: 0-10 Pain Score: 7  Pain Location: left ankle, low back Pain Descriptors / Indicators: Aching Pain Intervention(s): Limited activity within patient's tolerance;Premedicated before session;PCA encouraged    Home Living Family/patient expects to be discharged to:: Private residence Living Arrangements: Spouse/significant other Available Help at Discharge: Family;Available 24 hours/day Type of Home: House Home Access: Level entry (to ground floor)   Home Layout: Two level;Able to live on main level with bedroom/bathroom Home Equipment: Gilford Rile - 2 wheels;Cane - single point;Bedside commode      Prior Function Level of Independence: Independent with assistive device(s)      Comments: uses a cane   PT Goals (current goals can now be found in the care plan section) Acute Rehab PT Goals Patient Stated Goal: none stated PT Goal Formulation: With patient Time For Goal Achievement: 11/03/14 Potential to Achieve Goals: Good Progress towards PT goals: Progressing toward goals  Frequency  Min 6X/week    PT Plan Current plan remains appropriate    Co-evaluation             End of Session Equipment Utilized During Treatment: Gait belt Activity Tolerance: Patient tolerated treatment well;Patient limited by pain Patient left: in bed;with call bell/phone within reach;with bed alarm set     Time: 1110-1141 PT Time Calculation (min) (ACUTE ONLY): 31 min  Charges:  $Therapeutic Activity: 23-37 mins                    G Codes:      Sable Feil 2014/10/30, 12:21 PM

## 2014-10-27 NOTE — Care Management Note (Addendum)
    Page 1 of 2   10/28/2014     1:30:39 PM CARE MANAGEMENT NOTE 10/28/2014  Patient:  Regina Horton, Regina Horton   Account Number:  [id number]  Date Initiated:  10/27/2014  Documentation initiated by:  Jolene Provost  Subjective/Objective Assessment:   Pt is from home lives with husband. Pt plans to discharge home with [facility] PT. Per PT's recommendations pt has been ordered a wheelchair. Pt has choosen to use [facility] for [facility] and DME's.     Action/Plan:   Romualdo Bolk of [facility] has been notified of referral and will obtain pt information from chart. Emma, of [facility] has been notified of DME orders and will obtain pt information from chart. Wheelchair will be delivered to hospital prior to discharg   Anticipated DC Date:  10/28/2014   Anticipated DC Plan:  [place]  CM consult      [place]   Choice offered to / List presented to:  C-1 Patient   DME arranged  [place]      DME agency  [place] arranged  [place] RN      [place].   Status of service:  Completed, signed off Medicare Important Message given?  YES (If response is "NO", the following Medicare IM given date fields will be blank) Date Medicare IM given:  10/28/2014 Medicare IM given by:  Jolene Provost Date Additional Medicare IM given:   Additional Medicare IM given by:    Discharge Disposition:  [place]  Per UR Regulation:  Reviewed for med. necessity/level of care/duration of stay  If discussed at [place] of Stay Meetings, dates discussed:    Comments:  10/28/2014 [place], RN, MSN, CM Pt discharged today. RN added to [facility] order. [facility] is aware. 10/27/2014 [place], RN, MSN, CM No further CM needs identified.

## 2014-10-27 NOTE — Op Note (Signed)
NAMEAMEIRA, BOOTZ               ACCOUNT NO.:  [id number]  MEDICAL RECORD NO.:  [id number]  LOCATION:  A315                          FACILITY:  [facility]  PHYSICIAN:  J. Sanjuana Kava, M.D. DATE OF BIRTH:  [birth year]  DATE OF PROCEDURE: DATE OF DISCHARGE:                              OPERATIVE REPORT   PREOPERATIVE DIAGNOSIS:  Trimalleolar fracture, displaced of the left ankle, closed.  POSTOPERATIVE DIAGNOSIS:  Trimalleolar fracture, displaced of the left ankle, closed.  PROCEDURE:  Open treatment and internal fixation of the left trimalleolar fracture of the ankle using two cancellous screws medially and a 6-hole sideplate and screws laterally.  Posterior malleolus was not fixed as it turned to good position.  The patient had surgery while supine.  TOURNIQUET TIME:  47 minutes.  DRAINS:  None.  Posterior splint applied.  ANESTHESIA:  General.  SURGEON:  J. Sanjuana Kava, M.D.  ASSISTANT:  Marquita Palms, RN.  INDICATIONS:  The patient fell at her home two days ago in the evening and injured her left ankle.  She stayed at home.  She had a fentanyl patch and used that.  Pain had not getting any better, so she came to the emergency room yesterday, was seen and the diagnosis of trimalleolar fracture of the left ankle was made.  The patient has chronic pain syndrome, was on fentanyl patches for this and other significant medical problems.  She was felt to be a candidate for surgery.  I went over the risks and imponderables with the patient preoperatively and she appeared to understand the procedure as outlined.  She asked appropriate questions.  DESCRIPTION OF PROCEDURE:  The patient was brought to the operating room.  The site was identified.  The patient said she is allergic to nickel, I told her previously that we were going to use stainless steel, but in the holding area, she mentioned that she may be somewhat allergic to the stainless steel as she had some stainless  steel jewelry and she broke out.  We did not want to have a problem at all.  Therefore, we called the main operating room at [facility] in [place] and had them send up the titanium metal set to do the ankle.  We do not have one readily available here.  There was a delay of about an hour to an hour-and-half to get the equipment here.  The patient understood and I understood, both thought it was well worth of slight delay.  Once the equipment arrived, we then proceeded to do the surgery.  The patient was brought to the operating room and placed supine. Tourniquet placed, deflated the left upper thigh.  She was given general anesthesia while supine.  She was then prepped and draped in the usual manner.  Had a generalized time-out identifying the patient as Ms. Tesar, we were doing the left ankle for trimalleolar fracture.  The titanium set was in place as all the other equipment and working properly.  The OR team knew each other.  C-arm unit had been brought in and was ready, everyone had on lead aprons, lead shields and x-ray badges.  We had a separate time-out for the x-ray.  A  scout film was taken with the C-arm fluoroscopy unit with the patient just to make sure everything was working properly and it was.  The patient's leg was then elevated after I made marks for the incisions and tourniquet was inflated to 300 mmHg. Esmarch bandage removed.  Attention was directed first to the medial side.  The medial fracture was reduced.  After checking the joint, there was no material within the joint other than some blood and then the fracture was held with a clamp.  A smooth, very small K-wire was used to hold the medial malleolus in place and I used a 3.5 drill and placed the 55-mm cancellous screw with no threads near the head.  I placed another screw and it was slightly posteriorly, I did not like the position, so I then went more anterior, placed second screw and I used a 40-mm screw  in this area, cancellous type.  Permanent x-rays were taken of course and these looked very good and posterior malleolus was well reduced. Attention was then directed to the lateral side.  Incision was made, fracture was identified, I then placed with a serrated clamp and I selected a 6-hole plate.  Drill holes were made and the most distal screw was a cancellous and the other screws were cortical.  This set does not have a locking screw with it.  Permanent x-rays were taken, AP and lateral, and these looked good.  Posterior malleolus was well reduced and other fracture was well reduced as well.  The wounds were then reapproximated using 2-0 chromic interrupted figure-of-eight manner.  Subcutaneous tissue was closed with 2-0 plain in similar fashion and then the skin was reapproximated with skin staples, both laterally and then medially.  Sterile dressings were applied.  Bulky dressing was applied and sheet cotton applied.  Tourniquet deflated after 47 minutes.  Posterior splint was applied with an Ace bandage loosely.  The patient tolerated the procedure well, will go to recovery in good condition.  She has a course admitted.  She will begin physical therapy tomorrow.  She will be maintained on her IV fentanyl.          ______________________________ J. Sanjuana Kava, M.D.     JWK/MEDQ  D:  10/26/2014  T:  10/27/2014  Job:  [id number]

## 2014-10-28 LAB — CBC WITH DIFFERENTIAL/PLATELET
BASOS PCT: 0 % (ref 0–1)
Basophils Absolute: 0 10*3/uL (ref 0.0–0.1)
EOS ABS: 0.1 10*3/uL (ref 0.0–0.7)
EOS PCT: 1 % (ref 0–5)
HEMATOCRIT: 29.2 % — AB (ref 36.0–46.0)
HEMOGLOBIN: 9.8 g/dL — AB (ref 12.0–15.0)
Lymphocytes Relative: 26 % (ref 12–46)
Lymphs Abs: 1.8 10*3/uL (ref 0.7–4.0)
MCH: 31.6 pg (ref 26.0–34.0)
MCHC: 33.6 g/dL (ref 30.0–36.0)
MCV: 94.2 fL (ref 78.0–100.0)
MONO ABS: 0.6 10*3/uL (ref 0.1–1.0)
Monocytes Relative: 10 % (ref 3–12)
NEUTROS ABS: 4.3 10*3/uL (ref 1.7–7.7)
Neutrophils Relative %: 63 % (ref 43–77)
Platelets: 251 10*3/uL (ref 150–400)
RBC: 3.1 MIL/uL — AB (ref 3.87–5.11)
RDW: 13 % (ref 11.5–15.5)
WBC: 6.7 10*3/uL (ref 4.0–10.5)

## 2014-10-28 LAB — BASIC METABOLIC PANEL
Anion gap: 6 (ref 5–15)
BUN: 11 mg/dL (ref 6–23)
CO2: 28 mmol/L (ref 19–32)
CREATININE: 1.15 mg/dL — AB (ref 0.50–1.10)
Calcium: 9 mg/dL (ref 8.4–10.5)
Chloride: 109 mmol/L (ref 96–112)
GFR calc Af Amer: 57 mL/min — ABNORMAL LOW (ref >90)
GFR calc non Af Amer: 49 mL/min — ABNORMAL LOW (ref >90)
GLUCOSE: 105 mg/dL — AB (ref 70–99)
POTASSIUM: 3.7 mmol/L (ref 3.5–5.1)
Sodium: 143 mmol/L (ref 135–145)

## 2014-10-28 MED ORDER — ENOXAPARIN SODIUM 40 MG/0.4ML ~~LOC~~ SOLN: 40.0000 mg | SUBCUTANEOUS | Status: DC

## 2014-10-28 NOTE — Discharge Summary (Signed)
Physician Discharge Summary  Patient ID: EVELETTE PALLAN MRN: [id number] DOB/AGE: 66-Jul-1952 66 y.o.  Admit date: 10/25/2014 Discharge date: 10/28/2014  Admission Diagnoses:Trimalleolar fracture of left ankle, closed, displaced. Osteoporosis. Vitamin D Deficiency. Hyperlipidemia. GERD. Anxiety and depression. Essential Hypertension. Chronic Constipation. Chronic pain syndrome, chronic lower back pain. 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  Discharge Diagnoses: same as admission Active Problems:   Trimalleolar fracture of left ankle   Discharged Condition: good  Hospital Course: The patient was seen in the ER with a trimalleolar fracture displaced on the left side.  She was admitted and had surgery on the ankle the following day.  She tolerated the surgery well. She has chronic pain and is on pain patches.  She was given IV PCA which controlled her pain well and then started back on the pain patch the day after surgery.  She was seen by PT and has done well with toe touch weight bearing.  She will have a wheelchair for home use.  She will have home health and continue her enoxaparin for two weeks.  She knows care of the cast.  She has remained afebrile and labs have remained normal.  Consults: None  Significant Diagnostic Studies: labs: normal and radiology: X-Ray: showing trimalleolar fracture of the ankle on the left and post surgery changes.  Treatments: IV hydration, antibiotics: Ancef, analgesia: Fentyl, anticoagulation: enoxaparin, respiratory therapy: albuterol/atropine nebulizer and surgery: open reduction and internal fixation of the left trimalleolar fracture of the ankle.  Discharge Exam: Blood pressure 122/60, pulse 64, temperature 99 F (37.2 C), temperature source Oral, resp. rate 18, height 5\' 5"  (1.651 m), weight 89.359 kg (197 lb), SpO2 98 %. General appearance: alert and cooperative.  Labs normal.  Neurovascular intact to the left ankle and posterior splint applied.  Pain well  controlled.  Disposition: 01-Home or Self Care     Medication List    TAKE these medications        atorvastatin 40 MG tablet  Commonly known as:  LIPITOR  Take 1 tablet (40 mg total) by mouth daily. One tab by mouth once daily     Calcium-Vitamin D 600-125 MG-UNIT Tabs  Take 1 tablet by mouth daily.     diltiazem 240 MG 24 hr capsule  Commonly known as:  CARDIZEM CD  Take 1 capsule (240 mg total) by mouth daily.     DULoxetine 60 MG capsule  Commonly known as:  CYMBALTA  TAKE ONE CAPSULE BY MOUTH EVERY DAY     enoxaparin 40 MG/0.4ML injection  Commonly known as:  LOVENOX  Inject 0.4 mLs (40 mg total) into the skin daily.     Fenofibric Acid 135 MG Cpdr  Take 135 mg by mouth daily.     fentaNYL 50 MCG/HR  Commonly known as:  DURAGESIC - dosed mcg/hr  Place 1 patch (50 mcg total) onto the skin every other day. Change patch every 2 days     fluticasone 50 MCG/ACT nasal spray  Commonly known as:  FLONASE  USE 2 SPRAYS IN EACH       NOSTRIL DAILY     gabapentin 800 MG tablet  Commonly known as:  NEURONTIN  TAKE 1 TABLET TWICE A DAY     levocetirizine 5 MG tablet  Commonly known as:  XYZAL  TAKE 1 TABLET BY MOUTH EVERY DAY     levothyroxine 50 MCG tablet  Commonly known as:  SYNTHROID  Take 1 tablet (50 mcg total) by mouth daily.     LORazepam  1 MG tablet  Commonly known as:  ATIVAN  TAKE ONE TABLET BY MOUTH ONCE DAILY AT BEDTIME     metoprolol succinate 100 MG 24 hr tablet  Commonly known as:  TOPROL-XL  Take 1 tablet (100 mg total) by mouth daily.     omega-3 acid ethyl esters 1 G capsule  Commonly known as:  LOVAZA  Take 2 capsules (2 g total) by mouth 2 (two) times daily.     oxyCODONE-acetaminophen 10-325 MG per tablet  Commonly known as:  PERCOCET  One tablet four times daily, as needed, for pain     pantoprazole 40 MG tablet  Commonly known as:  PROTONIX  TAKE 1 TABLET DAILY     QC DAILY MULTIVITAMINS/IRON PO  Take 1 tablet by mouth once a  week.     raloxifene 60 MG tablet  Commonly known as:  EVISTA  TAKE 1 TABLET DAILY     sennosides-docusate sodium 8.6-50 MG tablet  Commonly known as:  SENOKOT-S  Take 1 tablet by mouth at bedtime. One tablet by mouth at bedtime     torsemide 20 MG tablet  Commonly known as:  DEMADEX  Take 1 tablet (20 mg total) by mouth as needed.     Vitamin D 1000 UNITS capsule  Take 1,000 Units by mouth 2 (two) times daily.           Follow-up Information    Follow up with Sanjuana Kava, MD In 2 weeks.   Specialty:  Orthopedic Surgery   Contact information:   [place] [address] [phone]       Signed: Sanjuana Kava 10/28/2014, 7:25 AM

## 2014-10-28 NOTE — Progress Notes (Signed)
Patient will be going home with subcutaneous Lovenox injection prescription. Instructed and demonstrated administration with patient and had her give her own Lovenox this morning. Patient verbalized understanding with teach back method used.

## 2014-10-28 NOTE — Progress Notes (Signed)
Physical Therapy Treatment Patient Details Name: Regina Horton MRN: [id number] DOB: Jan 25, 1951 Today's Date: 10/28/2014    History of Present Illness HPI: She fell last night at home and hurt her left ankle. She had deformity. She is on pain patch and used that to control her pain. She has very chronic painful lower back pain. Today she was no better and came to ER. She has displaced trimalleolar fracture of the left ankle, no other injury.    PT Comments    Pt is feeling much less anxiety today and is much more mobile with transfers.  She has received her w/c and she was instructed in transfers from bed to w/c, able to do with no physical.assistance.  She was instructed in operation of the w/c and is able to self propel.  Acute care PT goals have been met.  Pt is to d/c today.  Follow Up Recommendations  Home health PT     Equipment Recommendations   (w/c received)    Recommendations for Other Services  none     Precautions / Restrictions Precautions Precautions: Fall Restrictions Weight Bearing Restrictions: Yes LLE Weight Bearing: Touchdown weight bearing    Mobility  Bed Mobility Overal bed mobility: Modified Independent                Transfers Overall transfer level: Needs assistance   Transfers: Squat Pivot Transfers Sit to Stand: Supervision   Squat pivot transfers: Supervision     General transfer comment: instructed in pivot transfers to w/c  Ambulation/Gait                 Holiday representative Wheelchair mobility: Yes Wheelchair propulsion: Both upper extremities;Right lower extremity Wheelchair parts: Needs assistance Distance: 20 Wheelchair Assistance Details (indicate cue type and reason): pt will need assist for elevation of leg rest  Modified Rankin (Stroke Patients Only)       Balance Overall balance assessment: No apparent balance deficits (not formally assessed)                                  Cognition Arousal/Alertness: Awake/alert Behavior During Therapy: WFL for tasks assessed/performed Overall Cognitive Status: Within Functional Limits for tasks assessed                      Exercises      General Comments        Pertinent Vitals/Pain Pain Assessment: No/denies pain    Home Living                      Prior Function            PT Goals (current goals can now be found in the care plan section) Progress towards PT goals: Goals met/education completed, patient discharged from PT    Frequency       PT Plan Current plan remains appropriate    Co-evaluation             End of Session Equipment Utilized During Treatment: Gait belt Activity Tolerance: Patient tolerated treatment well Patient left: in bed;with call bell/phone within reach;with bed alarm set     Time: 4917-9150 PT Time Calculation (min) (ACUTE ONLY): 28 min  Charges:  $Therapeutic Activity: 8-22 mins $Wheel Chair Management: 8-22 mins  G CodesSable Feil 2014-10-29, 10:29 AM

## 2014-10-28 NOTE — Progress Notes (Signed)
NURSING PROGRESS NOTE  Regina Horton [id number] Discharge Data: 10/28/2014 12:42 PM Attending Provider: No att. providers found [id number] Moshe Cipro, MD   Karren Cobble to be D/C'd Home per MD order.    All IV's discontinued and monitored for bleeding.  All belongings returned to patient for patient to take home.  AVS summary and prescriptions reviewed with patient and her spouse.   Patient left floor via wheelchair, escorted by NT.   Wheelchair given to patient via [facility]. Sent home with her.  Last Documented Vital Signs:  Blood pressure 122/60, pulse 64, temperature 99 F (37.2 C), temperature source Oral, resp. rate 18, height 5\' 5"  (1.651 m), weight 89.359 kg (197 lb), SpO2 98 %.  Cecilie Kicks D

## 2014-10-28 NOTE — Discharge Instructions (Signed)
Use walker and wheelchair.  Toe touch only on the left.  Keep cast dry.  Elevate foot often.  Ice as needed to ankle.  Home health will come to your home for nursing care and physical therapy.  Keep appointment with Dr. Luna Glasgow in two weeks.  Call his office at [phone] if any problem or if after hours, the hospital at [phone]. Ankle Fracture A fracture is a break in a bone. A cast or splint may be used to protect the ankle and heal the break. Sometimes, surgery is needed. HOME CARE  Use crutches as told by your doctor. It is very important that you use your crutches correctly.  Do not put weight or pressure on the injured ankle until told by your doctor.  Keep your ankle raised (elevated) when sitting or lying down.  Apply ice to the ankle:  Put ice in a plastic bag.  Place a towel between your cast and the bag.  Leave the ice on for 20 minutes, 2-3 times a day.  If you have a plaster or fiberglass cast:  Do not try to scratch under the cast with any objects.  Check the skin around the cast every day. You may put lotion on red or sore areas.  Keep your cast dry and clean.  If you have a plaster splint:  Wear the splint as told by your doctor.  You can loosen the elastic around the splint if your toes get numb, tingle, or turn cold or blue.  Do not put pressure on any part of your cast or splint. It may break. Rest your plaster splint or cast only on a pillow the first 24 hours until it is fully hardened.  Cover your cast or splint with a plastic bag during showers.  Do not lower your cast or splint into water.  Take medicine as told by your doctor.  Do not drive until your doctor says it is safe.  Follow-up with your doctor as told. It is very important that you go to your follow-up visits. GET HELP IF: The swelling and discomfort gets worse.  GET HELP RIGHT AWAY IF:   Your splint or cast breaks.  You continue to have very bad pain.  You have new pain  or swelling after your splint or cast was put on.  Your skin or toes below the injured ankle:  Turn blue or gray.  Feel cold, numb, or you cannot feel them.  There is a bad smell or yellowish white fluid (pus) coming from under the splint or cast. MAKE SURE YOU:   Understand these instructions.  Will watch your condition.  Will get help right away if you are not doing well or get worse. Document Released: 06/02/2009 Document Revised: 05/26/2013 Document Reviewed: 03/04/2013 [facility] Patient Information 2015 [place], [place]. This information is not intended to replace advice given to you by your health care provider. Make sure you discuss any questions you have with your health care provider.

## 2014-10-28 NOTE — Progress Notes (Signed)
Subjective: 2 Days Post-Op Procedure(s) (LRB): OPEN REDUCTION INTERNAL FIXATION (ORIF) ANKLE FRACTURE (Left) Patient reports pain as mild.    Objective: Vital signs in last 24 hours: Temp:  [98.9 F (37.2 C)-99.5 F (37.5 C)] 99 F (37.2 C) (03/11 0518) Pulse Rate:  [58-64] 64 (03/11 0518) Resp:  [12-20] 18 (03/11 0600) BP: (108-122)/(39-60) 122/60 mmHg (03/11 0518) SpO2:  [95 %-98 %] 98 % (03/11 0600)  Intake/Output from previous day: 03/10 0701 - 03/11 0700 In: 2288.8 [P.O.:720; I.V.:1568.8] Out: 400 [Urine:400] Intake/Output this shift:     Recent Labs  10/25/14 1450 10/26/14 1104 10/27/14 0535  HGB 12.3 11.2* 9.7*    Recent Labs  10/25/14 1450 10/26/14 1104 10/27/14 0535  WBC 9.2  --  8.1  RBC 3.98  --  3.08*  HCT 37.8 33.0* 29.1*  PLT 305  --  254    Recent Labs  10/25/14 1450 10/26/14 1104 10/27/14 0535  NA 142 143 142  K 3.1* 4.5 3.4*  CL 107  --  108  CO2 25  --  27  BUN 15  --  10  CREATININE 1.30*  --  1.11*  GLUCOSE 111* 110* 93  CALCIUM 9.6  --  9.2   No results for input(s): LABPT, INR in the last 72 hours.  Neurologically intact Neurovascular intact Sensation intact distally Intact pulses distally  She did well in PT yesterday.  She has home health and wheelchair arranged for home.  I will continue the enoxaparin at home for two weeks.  I will see her in the office in two weeks.  She is well prepared and ready to go home.  Pain well controlled.  Assessment/Plan: 2 Days Post-Op Procedure(s) (LRB): OPEN REDUCTION INTERNAL FIXATION (ORIF) ANKLE FRACTURE (Left) Discharge home with home health  Regina Horton 10/28/2014, 7:17 AM

## 2014-10-29 DIAGNOSIS — F419 Anxiety disorder, unspecified: Secondary | ICD-10-CM | POA: Diagnosis not present

## 2014-10-29 DIAGNOSIS — G894 Chronic pain syndrome: Secondary | ICD-10-CM | POA: Diagnosis not present

## 2014-10-29 DIAGNOSIS — N189 Chronic kidney disease, unspecified: Secondary | ICD-10-CM | POA: Diagnosis not present

## 2014-10-29 DIAGNOSIS — K219 Gastro-esophageal reflux disease without esophagitis: Secondary | ICD-10-CM | POA: Diagnosis not present

## 2014-10-29 DIAGNOSIS — F329 Major depressive disorder, single episode, unspecified: Secondary | ICD-10-CM | POA: Diagnosis not present

## 2014-10-29 DIAGNOSIS — M810 Age-related osteoporosis without current pathological fracture: Secondary | ICD-10-CM | POA: Diagnosis not present

## 2014-10-29 DIAGNOSIS — I129 Hypertensive chronic kidney disease with stage 1 through stage 4 chronic kidney disease, or unspecified chronic kidney disease: Secondary | ICD-10-CM | POA: Diagnosis not present

## 2014-10-29 DIAGNOSIS — S82852D Displaced trimalleolar fracture of left lower leg, subsequent encounter for closed fracture with routine healing: Secondary | ICD-10-CM | POA: Diagnosis not present

## 2014-10-29 DIAGNOSIS — M797 Fibromyalgia: Secondary | ICD-10-CM | POA: Diagnosis not present

## 2014-10-29 DIAGNOSIS — Z5181 Encounter for therapeutic drug level monitoring: Secondary | ICD-10-CM | POA: Diagnosis not present

## 2014-10-29 DIAGNOSIS — E785 Hyperlipidemia, unspecified: Secondary | ICD-10-CM | POA: Diagnosis not present

## 2014-10-29 DIAGNOSIS — Z7901 Long term (current) use of anticoagulants: Secondary | ICD-10-CM | POA: Diagnosis not present

## 2014-10-31 DIAGNOSIS — G894 Chronic pain syndrome: Secondary | ICD-10-CM | POA: Diagnosis not present

## 2014-10-31 DIAGNOSIS — M797 Fibromyalgia: Secondary | ICD-10-CM | POA: Diagnosis not present

## 2014-10-31 DIAGNOSIS — M810 Age-related osteoporosis without current pathological fracture: Secondary | ICD-10-CM | POA: Diagnosis not present

## 2014-10-31 DIAGNOSIS — I129 Hypertensive chronic kidney disease with stage 1 through stage 4 chronic kidney disease, or unspecified chronic kidney disease: Secondary | ICD-10-CM | POA: Diagnosis not present

## 2014-10-31 DIAGNOSIS — S82852D Displaced trimalleolar fracture of left lower leg, subsequent encounter for closed fracture with routine healing: Secondary | ICD-10-CM | POA: Diagnosis not present

## 2014-10-31 DIAGNOSIS — N189 Chronic kidney disease, unspecified: Secondary | ICD-10-CM | POA: Diagnosis not present

## 2014-11-01 ENCOUNTER — Other Ambulatory Visit: Payer: Self-pay | Admitting: Family Medicine

## 2014-11-02 DIAGNOSIS — N189 Chronic kidney disease, unspecified: Secondary | ICD-10-CM | POA: Diagnosis not present

## 2014-11-02 DIAGNOSIS — G894 Chronic pain syndrome: Secondary | ICD-10-CM | POA: Diagnosis not present

## 2014-11-02 DIAGNOSIS — S82852D Displaced trimalleolar fracture of left lower leg, subsequent encounter for closed fracture with routine healing: Secondary | ICD-10-CM | POA: Diagnosis not present

## 2014-11-02 DIAGNOSIS — M797 Fibromyalgia: Secondary | ICD-10-CM | POA: Diagnosis not present

## 2014-11-02 DIAGNOSIS — M810 Age-related osteoporosis without current pathological fracture: Secondary | ICD-10-CM | POA: Diagnosis not present

## 2014-11-02 DIAGNOSIS — I129 Hypertensive chronic kidney disease with stage 1 through stage 4 chronic kidney disease, or unspecified chronic kidney disease: Secondary | ICD-10-CM | POA: Diagnosis not present

## 2014-11-04 ENCOUNTER — Other Ambulatory Visit: Payer: Self-pay

## 2014-11-04 DIAGNOSIS — I129 Hypertensive chronic kidney disease with stage 1 through stage 4 chronic kidney disease, or unspecified chronic kidney disease: Secondary | ICD-10-CM | POA: Diagnosis not present

## 2014-11-04 DIAGNOSIS — M797 Fibromyalgia: Secondary | ICD-10-CM | POA: Diagnosis not present

## 2014-11-04 DIAGNOSIS — M810 Age-related osteoporosis without current pathological fracture: Secondary | ICD-10-CM | POA: Diagnosis not present

## 2014-11-04 DIAGNOSIS — N189 Chronic kidney disease, unspecified: Secondary | ICD-10-CM | POA: Diagnosis not present

## 2014-11-04 DIAGNOSIS — G894 Chronic pain syndrome: Secondary | ICD-10-CM | POA: Diagnosis not present

## 2014-11-04 DIAGNOSIS — S82852D Displaced trimalleolar fracture of left lower leg, subsequent encounter for closed fracture with routine healing: Secondary | ICD-10-CM | POA: Diagnosis not present

## 2014-11-04 MED ORDER — OXYCODONE-ACETAMINOPHEN 10-325 MG PO TABS: ORAL_TABLET | ORAL | Status: DC

## 2014-11-04 MED ORDER — FENTANYL 50 MCG/HR TD PT72: 50.0000 ug | MEDICATED_PATCH | TRANSDERMAL | Status: DC

## 2014-11-05 ENCOUNTER — Other Ambulatory Visit: Payer: Self-pay | Admitting: Family Medicine

## 2014-11-07 ENCOUNTER — Ambulatory Visit: Payer: Medicare Other | Admitting: Family Medicine

## 2014-11-08 DIAGNOSIS — M797 Fibromyalgia: Secondary | ICD-10-CM | POA: Diagnosis not present

## 2014-11-08 DIAGNOSIS — G894 Chronic pain syndrome: Secondary | ICD-10-CM | POA: Diagnosis not present

## 2014-11-08 DIAGNOSIS — N189 Chronic kidney disease, unspecified: Secondary | ICD-10-CM | POA: Diagnosis not present

## 2014-11-08 DIAGNOSIS — S82852D Displaced trimalleolar fracture of left lower leg, subsequent encounter for closed fracture with routine healing: Secondary | ICD-10-CM | POA: Diagnosis not present

## 2014-11-08 DIAGNOSIS — I129 Hypertensive chronic kidney disease with stage 1 through stage 4 chronic kidney disease, or unspecified chronic kidney disease: Secondary | ICD-10-CM | POA: Diagnosis not present

## 2014-11-08 DIAGNOSIS — M810 Age-related osteoporosis without current pathological fracture: Secondary | ICD-10-CM | POA: Diagnosis not present

## 2014-11-10 DIAGNOSIS — I129 Hypertensive chronic kidney disease with stage 1 through stage 4 chronic kidney disease, or unspecified chronic kidney disease: Secondary | ICD-10-CM | POA: Diagnosis not present

## 2014-11-10 DIAGNOSIS — S82852D Displaced trimalleolar fracture of left lower leg, subsequent encounter for closed fracture with routine healing: Secondary | ICD-10-CM | POA: Diagnosis not present

## 2014-11-10 DIAGNOSIS — G894 Chronic pain syndrome: Secondary | ICD-10-CM | POA: Diagnosis not present

## 2014-11-10 DIAGNOSIS — N189 Chronic kidney disease, unspecified: Secondary | ICD-10-CM | POA: Diagnosis not present

## 2014-11-10 DIAGNOSIS — M797 Fibromyalgia: Secondary | ICD-10-CM | POA: Diagnosis not present

## 2014-11-10 DIAGNOSIS — M810 Age-related osteoporosis without current pathological fracture: Secondary | ICD-10-CM | POA: Diagnosis not present

## 2014-11-13 ENCOUNTER — Other Ambulatory Visit: Payer: Self-pay | Admitting: Family Medicine

## 2014-11-14 ENCOUNTER — Telehealth: Payer: Self-pay | Admitting: Family Medicine

## 2014-11-14 NOTE — Telephone Encounter (Signed)
Med is ready for pickup

## 2014-11-15 ENCOUNTER — Telehealth: Payer: Self-pay | Admitting: *Deleted

## 2014-11-15 DIAGNOSIS — E079 Disorder of thyroid, unspecified: Secondary | ICD-10-CM | POA: Diagnosis not present

## 2014-11-15 DIAGNOSIS — M25562 Pain in left knee: Secondary | ICD-10-CM | POA: Diagnosis not present

## 2014-11-15 DIAGNOSIS — S82852D Displaced trimalleolar fracture of left lower leg, subsequent encounter for closed fracture with routine healing: Secondary | ICD-10-CM | POA: Diagnosis not present

## 2014-11-15 DIAGNOSIS — E1142 Type 2 diabetes mellitus with diabetic polyneuropathy: Secondary | ICD-10-CM | POA: Diagnosis not present

## 2014-11-15 DIAGNOSIS — F328 Other depressive episodes: Secondary | ICD-10-CM | POA: Diagnosis not present

## 2014-11-15 DIAGNOSIS — M810 Age-related osteoporosis without current pathological fracture: Secondary | ICD-10-CM | POA: Diagnosis not present

## 2014-11-15 DIAGNOSIS — I129 Hypertensive chronic kidney disease with stage 1 through stage 4 chronic kidney disease, or unspecified chronic kidney disease: Secondary | ICD-10-CM | POA: Diagnosis not present

## 2014-11-15 DIAGNOSIS — N189 Chronic kidney disease, unspecified: Secondary | ICD-10-CM | POA: Diagnosis not present

## 2014-11-15 DIAGNOSIS — Z8679 Personal history of other diseases of the circulatory system: Secondary | ICD-10-CM | POA: Diagnosis not present

## 2014-11-15 DIAGNOSIS — R600 Localized edema: Secondary | ICD-10-CM | POA: Diagnosis not present

## 2014-11-15 DIAGNOSIS — M797 Fibromyalgia: Secondary | ICD-10-CM | POA: Diagnosis not present

## 2014-11-15 DIAGNOSIS — S82852A Displaced trimalleolar fracture of left lower leg, initial encounter for closed fracture: Secondary | ICD-10-CM | POA: Diagnosis not present

## 2014-11-15 DIAGNOSIS — F419 Anxiety disorder, unspecified: Secondary | ICD-10-CM | POA: Diagnosis not present

## 2014-11-15 DIAGNOSIS — M25561 Pain in right knee: Secondary | ICD-10-CM | POA: Diagnosis not present

## 2014-11-15 DIAGNOSIS — G894 Chronic pain syndrome: Secondary | ICD-10-CM | POA: Diagnosis not present

## 2014-11-15 DIAGNOSIS — I10 Essential (primary) hypertension: Secondary | ICD-10-CM | POA: Diagnosis not present

## 2014-11-15 DIAGNOSIS — D649 Anemia, unspecified: Secondary | ICD-10-CM | POA: Diagnosis not present

## 2014-11-15 NOTE — Telephone Encounter (Signed)
Pt called stating she just got out of the hospital she broke her ankle and she needs pain medication and patches. Please advise

## 2014-11-15 NOTE — Telephone Encounter (Signed)
Patient aware that rx is ready for collection

## 2014-11-16 DIAGNOSIS — S82852D Displaced trimalleolar fracture of left lower leg, subsequent encounter for closed fracture with routine healing: Secondary | ICD-10-CM | POA: Diagnosis not present

## 2014-11-16 DIAGNOSIS — N189 Chronic kidney disease, unspecified: Secondary | ICD-10-CM | POA: Diagnosis not present

## 2014-11-16 DIAGNOSIS — I129 Hypertensive chronic kidney disease with stage 1 through stage 4 chronic kidney disease, or unspecified chronic kidney disease: Secondary | ICD-10-CM | POA: Diagnosis not present

## 2014-11-16 DIAGNOSIS — G894 Chronic pain syndrome: Secondary | ICD-10-CM | POA: Diagnosis not present

## 2014-11-16 DIAGNOSIS — M797 Fibromyalgia: Secondary | ICD-10-CM | POA: Diagnosis not present

## 2014-11-16 DIAGNOSIS — M810 Age-related osteoporosis without current pathological fracture: Secondary | ICD-10-CM | POA: Diagnosis not present

## 2014-11-18 DIAGNOSIS — M797 Fibromyalgia: Secondary | ICD-10-CM | POA: Diagnosis not present

## 2014-11-18 DIAGNOSIS — N189 Chronic kidney disease, unspecified: Secondary | ICD-10-CM | POA: Diagnosis not present

## 2014-11-18 DIAGNOSIS — G894 Chronic pain syndrome: Secondary | ICD-10-CM | POA: Diagnosis not present

## 2014-11-18 DIAGNOSIS — S82852D Displaced trimalleolar fracture of left lower leg, subsequent encounter for closed fracture with routine healing: Secondary | ICD-10-CM | POA: Diagnosis not present

## 2014-11-18 DIAGNOSIS — I129 Hypertensive chronic kidney disease with stage 1 through stage 4 chronic kidney disease, or unspecified chronic kidney disease: Secondary | ICD-10-CM | POA: Diagnosis not present

## 2014-11-18 DIAGNOSIS — M810 Age-related osteoporosis without current pathological fracture: Secondary | ICD-10-CM | POA: Diagnosis not present

## 2014-11-24 DIAGNOSIS — I129 Hypertensive chronic kidney disease with stage 1 through stage 4 chronic kidney disease, or unspecified chronic kidney disease: Secondary | ICD-10-CM | POA: Diagnosis not present

## 2014-11-24 DIAGNOSIS — M797 Fibromyalgia: Secondary | ICD-10-CM | POA: Diagnosis not present

## 2014-11-24 DIAGNOSIS — N189 Chronic kidney disease, unspecified: Secondary | ICD-10-CM | POA: Diagnosis not present

## 2014-11-24 DIAGNOSIS — M810 Age-related osteoporosis without current pathological fracture: Secondary | ICD-10-CM | POA: Diagnosis not present

## 2014-11-24 DIAGNOSIS — S82852D Displaced trimalleolar fracture of left lower leg, subsequent encounter for closed fracture with routine healing: Secondary | ICD-10-CM | POA: Diagnosis not present

## 2014-11-24 DIAGNOSIS — G894 Chronic pain syndrome: Secondary | ICD-10-CM | POA: Diagnosis not present

## 2014-12-01 DIAGNOSIS — N189 Chronic kidney disease, unspecified: Secondary | ICD-10-CM | POA: Diagnosis not present

## 2014-12-01 DIAGNOSIS — M797 Fibromyalgia: Secondary | ICD-10-CM | POA: Diagnosis not present

## 2014-12-01 DIAGNOSIS — I129 Hypertensive chronic kidney disease with stage 1 through stage 4 chronic kidney disease, or unspecified chronic kidney disease: Secondary | ICD-10-CM | POA: Diagnosis not present

## 2014-12-01 DIAGNOSIS — M810 Age-related osteoporosis without current pathological fracture: Secondary | ICD-10-CM | POA: Diagnosis not present

## 2014-12-01 DIAGNOSIS — G894 Chronic pain syndrome: Secondary | ICD-10-CM | POA: Diagnosis not present

## 2014-12-01 DIAGNOSIS — S82852D Displaced trimalleolar fracture of left lower leg, subsequent encounter for closed fracture with routine healing: Secondary | ICD-10-CM | POA: Diagnosis not present

## 2014-12-02 ENCOUNTER — Other Ambulatory Visit: Payer: Self-pay | Admitting: Family Medicine

## 2014-12-05 ENCOUNTER — Other Ambulatory Visit: Payer: Self-pay | Admitting: Family Medicine

## 2014-12-08 ENCOUNTER — Other Ambulatory Visit: Payer: Self-pay | Admitting: Family Medicine

## 2014-12-08 DIAGNOSIS — G894 Chronic pain syndrome: Secondary | ICD-10-CM | POA: Diagnosis not present

## 2014-12-08 DIAGNOSIS — S82852D Displaced trimalleolar fracture of left lower leg, subsequent encounter for closed fracture with routine healing: Secondary | ICD-10-CM | POA: Diagnosis not present

## 2014-12-08 DIAGNOSIS — M797 Fibromyalgia: Secondary | ICD-10-CM | POA: Diagnosis not present

## 2014-12-08 DIAGNOSIS — N189 Chronic kidney disease, unspecified: Secondary | ICD-10-CM | POA: Diagnosis not present

## 2014-12-08 DIAGNOSIS — M810 Age-related osteoporosis without current pathological fracture: Secondary | ICD-10-CM | POA: Diagnosis not present

## 2014-12-08 DIAGNOSIS — I129 Hypertensive chronic kidney disease with stage 1 through stage 4 chronic kidney disease, or unspecified chronic kidney disease: Secondary | ICD-10-CM | POA: Diagnosis not present

## 2014-12-09 ENCOUNTER — Other Ambulatory Visit: Payer: Self-pay

## 2014-12-09 ENCOUNTER — Ambulatory Visit: Payer: Medicare Other | Admitting: Cardiovascular Disease

## 2014-12-09 MED ORDER — OXYCODONE-ACETAMINOPHEN 10-325 MG PO TABS: ORAL_TABLET | ORAL | Status: DC

## 2014-12-09 MED ORDER — FENTANYL 50 MCG/HR TD PT72: 50.0000 ug | MEDICATED_PATCH | TRANSDERMAL | Status: DC

## 2014-12-15 DIAGNOSIS — R600 Localized edema: Secondary | ICD-10-CM | POA: Diagnosis not present

## 2014-12-15 DIAGNOSIS — D649 Anemia, unspecified: Secondary | ICD-10-CM | POA: Diagnosis not present

## 2014-12-15 DIAGNOSIS — I10 Essential (primary) hypertension: Secondary | ICD-10-CM | POA: Diagnosis not present

## 2014-12-15 DIAGNOSIS — E1142 Type 2 diabetes mellitus with diabetic polyneuropathy: Secondary | ICD-10-CM | POA: Diagnosis not present

## 2014-12-15 DIAGNOSIS — S82852D Displaced trimalleolar fracture of left lower leg, subsequent encounter for closed fracture with routine healing: Secondary | ICD-10-CM | POA: Diagnosis not present

## 2014-12-15 DIAGNOSIS — F328 Other depressive episodes: Secondary | ICD-10-CM | POA: Diagnosis not present

## 2014-12-15 DIAGNOSIS — E079 Disorder of thyroid, unspecified: Secondary | ICD-10-CM | POA: Diagnosis not present

## 2014-12-15 DIAGNOSIS — Z8679 Personal history of other diseases of the circulatory system: Secondary | ICD-10-CM | POA: Diagnosis not present

## 2014-12-16 DIAGNOSIS — I129 Hypertensive chronic kidney disease with stage 1 through stage 4 chronic kidney disease, or unspecified chronic kidney disease: Secondary | ICD-10-CM | POA: Diagnosis not present

## 2014-12-16 DIAGNOSIS — M810 Age-related osteoporosis without current pathological fracture: Secondary | ICD-10-CM | POA: Diagnosis not present

## 2014-12-16 DIAGNOSIS — N189 Chronic kidney disease, unspecified: Secondary | ICD-10-CM | POA: Diagnosis not present

## 2014-12-16 DIAGNOSIS — S82852D Displaced trimalleolar fracture of left lower leg, subsequent encounter for closed fracture with routine healing: Secondary | ICD-10-CM | POA: Diagnosis not present

## 2014-12-16 DIAGNOSIS — M797 Fibromyalgia: Secondary | ICD-10-CM | POA: Diagnosis not present

## 2014-12-16 DIAGNOSIS — G894 Chronic pain syndrome: Secondary | ICD-10-CM | POA: Diagnosis not present

## 2014-12-17 ENCOUNTER — Other Ambulatory Visit: Payer: Self-pay | Admitting: Family Medicine

## 2014-12-20 ENCOUNTER — Other Ambulatory Visit: Payer: Self-pay | Admitting: Family Medicine

## 2014-12-22 DIAGNOSIS — M810 Age-related osteoporosis without current pathological fracture: Secondary | ICD-10-CM | POA: Diagnosis not present

## 2014-12-22 DIAGNOSIS — G894 Chronic pain syndrome: Secondary | ICD-10-CM | POA: Diagnosis not present

## 2014-12-22 DIAGNOSIS — S82852D Displaced trimalleolar fracture of left lower leg, subsequent encounter for closed fracture with routine healing: Secondary | ICD-10-CM | POA: Diagnosis not present

## 2014-12-22 DIAGNOSIS — M797 Fibromyalgia: Secondary | ICD-10-CM | POA: Diagnosis not present

## 2014-12-22 DIAGNOSIS — N189 Chronic kidney disease, unspecified: Secondary | ICD-10-CM | POA: Diagnosis not present

## 2014-12-22 DIAGNOSIS — I129 Hypertensive chronic kidney disease with stage 1 through stage 4 chronic kidney disease, or unspecified chronic kidney disease: Secondary | ICD-10-CM | POA: Diagnosis not present

## 2014-12-27 DIAGNOSIS — Z8679 Personal history of other diseases of the circulatory system: Secondary | ICD-10-CM | POA: Diagnosis not present

## 2014-12-27 DIAGNOSIS — S82852D Displaced trimalleolar fracture of left lower leg, subsequent encounter for closed fracture with routine healing: Secondary | ICD-10-CM | POA: Diagnosis not present

## 2014-12-27 DIAGNOSIS — D649 Anemia, unspecified: Secondary | ICD-10-CM | POA: Diagnosis not present

## 2014-12-27 DIAGNOSIS — R600 Localized edema: Secondary | ICD-10-CM | POA: Diagnosis not present

## 2014-12-27 DIAGNOSIS — E1142 Type 2 diabetes mellitus with diabetic polyneuropathy: Secondary | ICD-10-CM | POA: Diagnosis not present

## 2014-12-27 DIAGNOSIS — F328 Other depressive episodes: Secondary | ICD-10-CM | POA: Diagnosis not present

## 2015-01-05 ENCOUNTER — Other Ambulatory Visit: Payer: Self-pay | Admitting: Family Medicine

## 2015-01-06 ENCOUNTER — Other Ambulatory Visit: Payer: Self-pay

## 2015-01-06 MED ORDER — OXYCODONE-ACETAMINOPHEN 10-325 MG PO TABS: ORAL_TABLET | ORAL | Status: DC

## 2015-01-06 MED ORDER — FENTANYL 50 MCG/HR TD PT72: 50.0000 ug | MEDICATED_PATCH | TRANSDERMAL | Status: DC

## 2015-01-09 ENCOUNTER — Encounter: Payer: Self-pay | Admitting: Family Medicine

## 2015-01-09 ENCOUNTER — Ambulatory Visit (INDEPENDENT_AMBULATORY_CARE_PROVIDER_SITE_OTHER): Payer: Medicare Other | Admitting: Family Medicine

## 2015-01-09 VITALS — BP 126/70 | HR 74 | Resp 18 | Ht 64.0 in | Wt 211.1 lb

## 2015-01-09 DIAGNOSIS — S82852S Displaced trimalleolar fracture of left lower leg, sequela: Secondary | ICD-10-CM

## 2015-01-09 DIAGNOSIS — M5442 Lumbago with sciatica, left side: Secondary | ICD-10-CM

## 2015-01-09 DIAGNOSIS — Z9181 History of falling: Secondary | ICD-10-CM

## 2015-01-09 DIAGNOSIS — D62 Acute posthemorrhagic anemia: Secondary | ICD-10-CM

## 2015-01-09 DIAGNOSIS — I10 Essential (primary) hypertension: Secondary | ICD-10-CM | POA: Diagnosis not present

## 2015-01-09 DIAGNOSIS — Z23 Encounter for immunization: Secondary | ICD-10-CM

## 2015-01-09 DIAGNOSIS — F329 Major depressive disorder, single episode, unspecified: Secondary | ICD-10-CM

## 2015-01-09 DIAGNOSIS — F419 Anxiety disorder, unspecified: Secondary | ICD-10-CM

## 2015-01-09 DIAGNOSIS — E785 Hyperlipidemia, unspecified: Secondary | ICD-10-CM

## 2015-01-09 DIAGNOSIS — G8929 Other chronic pain: Secondary | ICD-10-CM

## 2015-01-09 DIAGNOSIS — F32A Depression, unspecified: Secondary | ICD-10-CM

## 2015-01-09 DIAGNOSIS — Z0100 Encounter for examination of eyes and vision without abnormal findings: Secondary | ICD-10-CM

## 2015-01-09 DIAGNOSIS — F418 Other specified anxiety disorders: Secondary | ICD-10-CM

## 2015-01-09 DIAGNOSIS — E039 Hypothyroidism, unspecified: Secondary | ICD-10-CM | POA: Diagnosis not present

## 2015-01-09 DIAGNOSIS — Z1239 Encounter for other screening for malignant neoplasm of breast: Secondary | ICD-10-CM

## 2015-01-09 DIAGNOSIS — M25569 Pain in unspecified knee: Secondary | ICD-10-CM

## 2015-01-09 DIAGNOSIS — R296 Repeated falls: Secondary | ICD-10-CM

## 2015-01-09 DIAGNOSIS — Z1231 Encounter for screening mammogram for malignant neoplasm of breast: Secondary | ICD-10-CM

## 2015-01-09 LAB — CBC
HEMATOCRIT: 35.8 % — AB (ref 36.0–46.0)
Hemoglobin: 12.2 g/dL (ref 12.0–15.0)
MCH: 31 pg (ref 26.0–34.0)
MCHC: 34.1 g/dL (ref 30.0–36.0)
MCV: 91.1 fL (ref 78.0–100.0)
MPV: 11 fL (ref 8.6–12.4)
Platelets: 346 10*3/uL (ref 150–400)
RBC: 3.93 MIL/uL (ref 3.87–5.11)
RDW: 14.6 % (ref 11.5–15.5)
WBC: 12.4 10*3/uL — ABNORMAL HIGH (ref 4.0–10.5)

## 2015-01-09 MED ORDER — METHYLPREDNISOLONE ACETATE 80 MG/ML IJ SUSP
80.0000 mg | Freq: Once | INTRAMUSCULAR | Status: AC
  Administered 2015-01-09: 80 mg via INTRAMUSCULAR

## 2015-01-09 MED ORDER — KETOROLAC TROMETHAMINE 60 MG/2ML IM SOLN
60.0000 mg | Freq: Once | INTRAMUSCULAR | Status: AC
  Administered 2015-01-09: 60 mg via INTRAMUSCULAR

## 2015-01-09 MED ORDER — PREDNISONE 5 MG (21) PO TBPK: 5.0000 mg | ORAL_TABLET | ORAL | Status: DC

## 2015-01-09 NOTE — Patient Instructions (Addendum)
F/u in 4 month, call if you need me befiore please.  Thankful you are gradually improving, and sorry that you fractured your ankle  Please discuss with Dr Luna Glasgow any possible surgery to help the knees  Toradol 60mg  Im and depo medrol 80 mg IM in office for back pain an d 6 day course of prednisone is sent in   Labs today, cbc, iron and ferritin, chem 7 , TSH  Pain script to be provided today fill when due  You are referred for eye exam witgh Dr Steward Ros are referred for mammogram due in June  Please call Dr Laural Golden to arrange for your colonoscopy  Prevnar in office today

## 2015-01-10 LAB — BASIC METABOLIC PANEL
BUN: 18 mg/dL (ref 6–23)
CHLORIDE: 108 meq/L (ref 96–112)
CO2: 24 meq/L (ref 19–32)
CREATININE: 1.47 mg/dL — AB (ref 0.50–1.10)
Calcium: 9.4 mg/dL (ref 8.4–10.5)
Glucose, Bld: 85 mg/dL (ref 70–99)
Potassium: 4.1 mEq/L (ref 3.5–5.3)
SODIUM: 141 meq/L (ref 135–145)

## 2015-01-10 LAB — IRON: IRON: 75 ug/dL (ref 42–145)

## 2015-01-10 LAB — FERRITIN: FERRITIN: 42 ng/mL (ref 10–291)

## 2015-01-10 LAB — TSH: TSH: 2.427 u[IU]/mL (ref 0.350–4.500)

## 2015-01-21 ENCOUNTER — Other Ambulatory Visit: Payer: Self-pay | Admitting: Family Medicine

## 2015-01-24 DIAGNOSIS — E1142 Type 2 diabetes mellitus with diabetic polyneuropathy: Secondary | ICD-10-CM | POA: Diagnosis not present

## 2015-01-24 DIAGNOSIS — R600 Localized edema: Secondary | ICD-10-CM | POA: Diagnosis not present

## 2015-01-24 DIAGNOSIS — S82852D Displaced trimalleolar fracture of left lower leg, subsequent encounter for closed fracture with routine healing: Secondary | ICD-10-CM | POA: Diagnosis not present

## 2015-01-24 DIAGNOSIS — D649 Anemia, unspecified: Secondary | ICD-10-CM | POA: Diagnosis not present

## 2015-01-24 DIAGNOSIS — Z8679 Personal history of other diseases of the circulatory system: Secondary | ICD-10-CM | POA: Diagnosis not present

## 2015-01-30 ENCOUNTER — Ambulatory Visit (HOSPITAL_COMMUNITY): Payer: Medicare Other

## 2015-02-03 ENCOUNTER — Other Ambulatory Visit: Payer: Self-pay

## 2015-02-03 MED ORDER — OXYCODONE-ACETAMINOPHEN 10-325 MG PO TABS: ORAL_TABLET | ORAL | Status: DC

## 2015-02-03 MED ORDER — FENTANYL 50 MCG/HR TD PT72: 50.0000 ug | MEDICATED_PATCH | TRANSDERMAL | Status: DC

## 2015-02-05 ENCOUNTER — Other Ambulatory Visit: Payer: Self-pay | Admitting: Family Medicine

## 2015-02-14 ENCOUNTER — Other Ambulatory Visit: Payer: Self-pay | Admitting: Family Medicine

## 2015-02-20 ENCOUNTER — Telehealth: Payer: Self-pay | Admitting: Family Medicine

## 2015-02-20 DIAGNOSIS — E039 Hypothyroidism, unspecified: Secondary | ICD-10-CM

## 2015-02-20 DIAGNOSIS — G8929 Other chronic pain: Secondary | ICD-10-CM | POA: Insufficient documentation

## 2015-02-20 DIAGNOSIS — E785 Hyperlipidemia, unspecified: Secondary | ICD-10-CM

## 2015-02-20 DIAGNOSIS — I10 Essential (primary) hypertension: Secondary | ICD-10-CM

## 2015-02-20 DIAGNOSIS — Z9181 History of falling: Secondary | ICD-10-CM | POA: Insufficient documentation

## 2015-02-20 DIAGNOSIS — M25569 Pain in unspecified knee: Secondary | ICD-10-CM

## 2015-02-20 DIAGNOSIS — E559 Vitamin D deficiency, unspecified: Secondary | ICD-10-CM

## 2015-02-20 DIAGNOSIS — Z23 Encounter for immunization: Secondary | ICD-10-CM | POA: Insufficient documentation

## 2015-02-20 NOTE — Assessment & Plan Note (Signed)
Controlled, no change in medication  

## 2015-02-20 NOTE — Assessment & Plan Note (Signed)
Deteriorated. Patient re-educated about  the importance of commitment to a  minimum of 150 minutes of exercise per week.  The importance of healthy food choices with portion control discussed. Encouraged to start a food diary, count calories and to consider  joining a support group. Sample diet sheets offered. Goals set by the patient for the next several months.   Weight /BMI 01/09/2015 10/26/2014 07/07/2014  WEIGHT 211 lb 1.9 oz 197 lb 209 lb 1.9 oz  HEIGHT 5\' 4"  5\' 5"  5\' 4"   BMI 36.22 kg/m2 32.78 kg/m2 35.88 kg/m2    Current exercise per week zero minutes.currently incapable due to recent ankle fracture

## 2015-02-20 NOTE — Assessment & Plan Note (Signed)
Uncontrolled Hyperlipidemia:Low fat diet discussed and encouraged.   Lipid Panel  Lab Results  Component Value Date   CHOL 152 07/07/2014   HDL 39* 07/07/2014   LDLCALC 69 07/07/2014   TRIG 222* 07/07/2014   CHOLHDL 3.9 07/07/2014      Updated lab past due , will request.

## 2015-02-20 NOTE — Progress Notes (Signed)
Subjective:    Patient ID: Regina Horton, female    DOB: 1950/11/28, 65 y.o.   MRN: [id number]  HPI The PT is here for follow up and re-evaluation of chronic medical conditions, medication management and review of any available recent lab and radiology data.  Preventive health is updated, specifically  Cancer screening and Immunization.   SUSUTAINED FRACTURED ANKLE SHORTLY AFTER LAST VISIT, WAS BOTH HOSPITALIZED AND IN REHAB PRIOR TO RETURNING HOME. GRADUALLY IMPROVING HOWEVER NOW C/O INCREASED KNEE PAIN , STIFFNESS AND INSTABILITY, WILL DISCUSS WITH ORTHO AT HER NEXT VISIT. Cancer screening and immunization needs are behind as is her lab work some of which will be ordered today, also needs vision test, has poor vision Depression and anxiety fairly well controlled currently, less tearful than she has been in the past  The PT denies any adverse reactions to current medications since the last visit.         Review of Systems See HPI Denies recent fever or chills. Denies sinus pressure, nasal congestion, ear pain or sore throat. Denies chest congestion, productive cough or wheezing. Denies chest pains, palpitations and leg swelling Denies abdominal pain, nausea, vomiting,diarrhea or constipation.   Denies dysuria, frequency, hesitancy or incontinence.  Denies headaches, seizures, numbness, or tingling. Denies uncontrolled  depression, anxiety or insomnia. Denies skin break down or rash.        Objective:   Physical Exam BP 126/70 mmHg  Pulse 74  Resp 18  Ht 5\' 4"  (1.626 m)  Wt 211 lb 1.9 oz (95.763 kg)  BMI 36.22 kg/m2  SpO2 97%  Patient alert and oriented and in no cardiopulmonary distress.  HEENT: No facial asymmetry, EOMI,   oropharynx pink and moist.  Neck supple no JVD, no mass.  Chest: Clear to auscultation bilaterally.  CVS: S1, S2 no murmurs, no S3.Regular rate.  ABD: Soft non tender.   Ext: No edema  MS: Decreased ROM spine, shoulders, hips and  knees.  Skin: Intact, no ulcerations or rash noted.  Psych: Good eye contact, normal affect. Memory intact not anxious or depressed appearing.  CNS: CN 2-12 intact, power,  normal throughout.no focal deficits noted.        Assessment & Plan:  Bilateral low back pain with sciatica Increased and uncontrolled  Uncontrolled.Toradol and depo medrol administered IM in the office , to be followed by a short course of oral prednisone   Hypothyroidism Controlled, no change in medication   Trimalleolar fracture of left ankle successful surgery and rehab in SNF, still on recovery road, but improving fraually  Essential hypertension Controlled, no change in medication DASH diet and commitment to daily physical activity for a minimum of 30 minutes discussed and encouraged, as a part of hypertension management. The importance of attaining a healthy weight is also discussed.  BP/Weight 01/09/2015 10/28/2014 10/26/2014 07/07/2014 05/04/2014 03/08/2014 123456  Systolic BP 123XX123 123XX123 - 0000000 [id number] [id number]  Diastolic BP 70 60 - 66 70 68 74  Wt. (Lbs) 211.12 - 197 209.12 212.2 211 215  BMI 36.22 - 32.78 35.88 36.41 36.2 36.35        Hyperlipidemia LDL goal <100 Uncontrolled Hyperlipidemia:Low fat diet discussed and encouraged.   Lipid Panel  Lab Results  Component Value Date   CHOL 152 07/07/2014   HDL 39* 07/07/2014   LDLCALC 69 07/07/2014   TRIG 222* 07/07/2014   CHOLHDL 3.9 07/07/2014      Updated lab past due , will request.   Knee pain,  chronic C/o increased pain and instability, will discuss with ortho at next visit  Morbid obesity Deteriorated. Patient re-educated about  the importance of commitment to a  minimum of 150 minutes of exercise per week.  The importance of healthy food choices with portion control discussed. Encouraged to start a food diary, count calories and to consider  joining a support group. Sample diet sheets offered. Goals set by the patient for the  next several months.   Weight /BMI 01/09/2015 10/26/2014 07/07/2014  WEIGHT 211 lb 1.9 oz 197 lb 209 lb 1.9 oz  HEIGHT 5\' 4"  5\' 5"  5\' 4"   BMI 36.22 kg/m2 32.78 kg/m2 35.88 kg/m2    Current exercise per week zero minutes.currently incapable due to recent ankle fracture   Need for vaccination with 13-polyvalent pneumococcal conjugate vaccine After obtaining informed consent, the vaccine is  administered by LPN.   Anxiety and depression Currently stable and controlled, no change in medication  At high risk for falls Home safety reviewed

## 2015-02-20 NOTE — Assessment & Plan Note (Signed)
After obtaining informed consent, the vaccine is  administered by LPN.  

## 2015-02-20 NOTE — Assessment & Plan Note (Addendum)
Currently stable and controlled, no change in medication

## 2015-02-20 NOTE — Assessment & Plan Note (Signed)
successful surgery and rehab in SNF, still on recovery road, but improving fraually

## 2015-02-20 NOTE — Assessment & Plan Note (Addendum)
Increased and uncontrolled  Uncontrolled.Toradol and depo medrol administered IM in the office , to be followed by a short course of oral prednisone

## 2015-02-20 NOTE — Assessment & Plan Note (Signed)
Home safety reviewed 

## 2015-02-20 NOTE — Assessment & Plan Note (Signed)
Controlled, no change in medication DASH diet and commitment to daily physical activity for a minimum of 30 minutes discussed and encouraged, as a part of hypertension management. The importance of attaining a healthy weight is also discussed.  BP/Weight 01/09/2015 10/28/2014 10/26/2014 07/07/2014 05/04/2014 03/08/2014 123456  Systolic BP 123XX123 123XX123 - 0000000 [id number] [id number]  Diastolic BP 70 60 - 66 70 68 74  Wt. (Lbs) 211.12 - 197 209.12 212.2 211 215  BMI 36.22 - 32.78 35.88 36.41 36.2 36.35

## 2015-02-20 NOTE — Telephone Encounter (Signed)
Pls order fasting lipid, cmp and EGFr and TSh and vit D for draw 3 to 5 days prior to her sept follow up and let her know, NEEDS lipids, past due !

## 2015-02-20 NOTE — Assessment & Plan Note (Signed)
C/o increased pain and instability, will discuss with ortho at next visit

## 2015-02-21 DIAGNOSIS — Z8679 Personal history of other diseases of the circulatory system: Secondary | ICD-10-CM | POA: Diagnosis not present

## 2015-02-21 DIAGNOSIS — R600 Localized edema: Secondary | ICD-10-CM | POA: Diagnosis not present

## 2015-02-21 DIAGNOSIS — Z6835 Body mass index (BMI) 35.0-35.9, adult: Secondary | ICD-10-CM | POA: Diagnosis not present

## 2015-02-21 DIAGNOSIS — D649 Anemia, unspecified: Secondary | ICD-10-CM | POA: Diagnosis not present

## 2015-02-21 DIAGNOSIS — E1142 Type 2 diabetes mellitus with diabetic polyneuropathy: Secondary | ICD-10-CM | POA: Diagnosis not present

## 2015-02-21 DIAGNOSIS — M25572 Pain in left ankle and joints of left foot: Secondary | ICD-10-CM | POA: Diagnosis not present

## 2015-02-27 NOTE — Addendum Note (Signed)
Addended by: Eual Fines on: 02/27/2015 08:48 AM   Modules accepted: Orders

## 2015-02-27 NOTE — Telephone Encounter (Signed)
Message left that labs would be mailed to her to do before her next visit

## 2015-03-09 ENCOUNTER — Other Ambulatory Visit: Payer: Self-pay | Admitting: Family Medicine

## 2015-03-09 ENCOUNTER — Other Ambulatory Visit: Payer: Self-pay | Admitting: Cardiovascular Disease

## 2015-03-09 NOTE — Telephone Encounter (Signed)
Rx(s) sent to pharmacy electronically.  

## 2015-03-10 ENCOUNTER — Other Ambulatory Visit: Payer: Self-pay

## 2015-03-10 MED ORDER — FENTANYL 50 MCG/HR TD PT72: 50.0000 ug | MEDICATED_PATCH | TRANSDERMAL | Status: DC

## 2015-03-10 MED ORDER — OXYCODONE-ACETAMINOPHEN 10-325 MG PO TABS: ORAL_TABLET | ORAL | Status: DC

## 2015-03-13 ENCOUNTER — Other Ambulatory Visit: Payer: Self-pay | Admitting: Family Medicine

## 2015-03-13 DIAGNOSIS — H40053 Ocular hypertension, bilateral: Secondary | ICD-10-CM | POA: Diagnosis not present

## 2015-03-13 DIAGNOSIS — H524 Presbyopia: Secondary | ICD-10-CM | POA: Diagnosis not present

## 2015-03-13 DIAGNOSIS — H40019 Open angle with borderline findings, low risk, unspecified eye: Secondary | ICD-10-CM | POA: Diagnosis not present

## 2015-03-13 DIAGNOSIS — H52221 Regular astigmatism, right eye: Secondary | ICD-10-CM | POA: Diagnosis not present

## 2015-03-22 ENCOUNTER — Other Ambulatory Visit: Payer: Self-pay | Admitting: Family Medicine

## 2015-03-23 DIAGNOSIS — D649 Anemia, unspecified: Secondary | ICD-10-CM | POA: Diagnosis not present

## 2015-03-23 DIAGNOSIS — R600 Localized edema: Secondary | ICD-10-CM | POA: Diagnosis not present

## 2015-03-23 DIAGNOSIS — E1142 Type 2 diabetes mellitus with diabetic polyneuropathy: Secondary | ICD-10-CM | POA: Diagnosis not present

## 2015-03-23 DIAGNOSIS — S82852D Displaced trimalleolar fracture of left lower leg, subsequent encounter for closed fracture with routine healing: Secondary | ICD-10-CM | POA: Diagnosis not present

## 2015-03-23 DIAGNOSIS — Z8679 Personal history of other diseases of the circulatory system: Secondary | ICD-10-CM | POA: Diagnosis not present

## 2015-03-23 DIAGNOSIS — Z6835 Body mass index (BMI) 35.0-35.9, adult: Secondary | ICD-10-CM | POA: Diagnosis not present

## 2015-03-24 ENCOUNTER — Other Ambulatory Visit: Payer: Self-pay

## 2015-03-24 MED ORDER — LORAZEPAM 1 MG PO TABS: 1.0000 mg | ORAL_TABLET | Freq: Every day | ORAL | Status: DC

## 2015-04-12 ENCOUNTER — Other Ambulatory Visit: Payer: Self-pay | Admitting: Cardiovascular Disease

## 2015-04-12 ENCOUNTER — Other Ambulatory Visit: Payer: Self-pay | Admitting: Family Medicine

## 2015-04-13 NOTE — Telephone Encounter (Signed)
Rx(s) sent to pharmacy electronically.  

## 2015-04-14 ENCOUNTER — Other Ambulatory Visit: Payer: Self-pay

## 2015-04-14 MED ORDER — FENTANYL 50 MCG/HR TD PT72: 50.0000 ug | MEDICATED_PATCH | TRANSDERMAL | Status: DC

## 2015-04-14 MED ORDER — OXYCODONE-ACETAMINOPHEN 10-325 MG PO TABS: ORAL_TABLET | ORAL | Status: DC

## 2015-04-26 ENCOUNTER — Other Ambulatory Visit: Payer: Self-pay | Admitting: Family Medicine

## 2015-05-12 DIAGNOSIS — I10 Essential (primary) hypertension: Secondary | ICD-10-CM | POA: Diagnosis not present

## 2015-05-12 DIAGNOSIS — E785 Hyperlipidemia, unspecified: Secondary | ICD-10-CM | POA: Diagnosis not present

## 2015-05-12 DIAGNOSIS — E559 Vitamin D deficiency, unspecified: Secondary | ICD-10-CM | POA: Diagnosis not present

## 2015-05-13 LAB — LIPID PANEL
CHOL/HDL RATIO: 3 ratio (ref <=5.0)
CHOLESTEROL: 141 mg/dL (ref 125–200)
HDL: 47 mg/dL (ref >=46)
LDL Cholesterol: 64 mg/dL (ref <130)
Triglycerides: 152 mg/dL — ABNORMAL HIGH (ref <150)
VLDL: 30 mg/dL (ref <30)

## 2015-05-13 LAB — COMPLETE METABOLIC PANEL WITH GFR
ALBUMIN: 4.1 g/dL (ref 3.6–5.1)
ALK PHOS: 42 U/L (ref 33–130)
ALT: 9 U/L (ref 6–29)
AST: 21 U/L (ref 10–35)
BUN: 21 mg/dL (ref 7–25)
CALCIUM: 9.6 mg/dL (ref 8.6–10.4)
CO2: 28 mmol/L (ref 20–31)
CREATININE: 1.44 mg/dL — AB (ref 0.50–0.99)
Chloride: 104 mmol/L (ref 98–110)
GFR, Est African American: 44 mL/min — ABNORMAL LOW (ref >=60)
GFR, Est Non African American: 38 mL/min — ABNORMAL LOW (ref >=60)
Glucose, Bld: 75 mg/dL (ref 65–99)
Potassium: 4.3 mmol/L (ref 3.5–5.3)
SODIUM: 142 mmol/L (ref 135–146)
Total Bilirubin: 0.5 mg/dL (ref 0.2–1.2)
Total Protein: 6.4 g/dL (ref 6.1–8.1)

## 2015-05-13 LAB — VITAMIN D 25 HYDROXY (VIT D DEFICIENCY, FRACTURES): Vit D, 25-Hydroxy: 32 ng/mL (ref 30–100)

## 2015-05-13 LAB — TSH: TSH: 4.619 u[IU]/mL — ABNORMAL HIGH (ref 0.350–4.500)

## 2015-05-15 ENCOUNTER — Other Ambulatory Visit: Payer: Self-pay | Admitting: Family Medicine

## 2015-05-16 ENCOUNTER — Encounter: Payer: Self-pay | Admitting: Family Medicine

## 2015-05-16 ENCOUNTER — Ambulatory Visit (INDEPENDENT_AMBULATORY_CARE_PROVIDER_SITE_OTHER): Payer: Medicare Other | Admitting: Family Medicine

## 2015-05-16 VITALS — BP 124/76 | HR 68 | Resp 18 | Ht 64.0 in | Wt 203.0 lb

## 2015-05-16 DIAGNOSIS — Z1159 Encounter for screening for other viral diseases: Secondary | ICD-10-CM | POA: Diagnosis not present

## 2015-05-16 DIAGNOSIS — M5442 Lumbago with sciatica, left side: Secondary | ICD-10-CM | POA: Diagnosis not present

## 2015-05-16 DIAGNOSIS — F419 Anxiety disorder, unspecified: Secondary | ICD-10-CM

## 2015-05-16 DIAGNOSIS — F329 Major depressive disorder, single episode, unspecified: Secondary | ICD-10-CM

## 2015-05-16 DIAGNOSIS — I10 Essential (primary) hypertension: Secondary | ICD-10-CM

## 2015-05-16 DIAGNOSIS — Z9181 History of falling: Secondary | ICD-10-CM

## 2015-05-16 DIAGNOSIS — F418 Other specified anxiety disorders: Secondary | ICD-10-CM

## 2015-05-16 DIAGNOSIS — E038 Other specified hypothyroidism: Secondary | ICD-10-CM

## 2015-05-16 DIAGNOSIS — Z23 Encounter for immunization: Secondary | ICD-10-CM

## 2015-05-16 DIAGNOSIS — K219 Gastro-esophageal reflux disease without esophagitis: Secondary | ICD-10-CM

## 2015-05-16 DIAGNOSIS — R296 Repeated falls: Secondary | ICD-10-CM

## 2015-05-16 DIAGNOSIS — F32A Depression, unspecified: Secondary | ICD-10-CM

## 2015-05-16 DIAGNOSIS — E785 Hyperlipidemia, unspecified: Secondary | ICD-10-CM

## 2015-05-16 MED ORDER — PREDNISONE 5 MG PO TABS: 5.0000 mg | ORAL_TABLET | Freq: Two times a day (BID) | ORAL | Status: DC

## 2015-05-16 MED ORDER — METHYLPREDNISOLONE ACETATE 80 MG/ML IJ SUSP
80.0000 mg | Freq: Once | INTRAMUSCULAR | Status: AC
  Administered 2015-05-16: 80 mg via INTRAMUSCULAR

## 2015-05-16 NOTE — Patient Instructions (Addendum)
Annual wellness in Feb, call if you need me sooner  Injection of depo medrol and 5 day course of prednisone today for back pain  Flu vaccine today  All the best with eye surgery  Please schedule for colonoscopy and mammogram when able, EYES come first, I agree!  Non fast TSH , chem 7 and EGFr, Hep C screen in 4 month  Thanks for choosing [facility], we consider it a privelige to serve you.  Thankful you are doing better   

## 2015-05-16 NOTE — Progress Notes (Signed)
Subjective:    Patient ID: Regina Horton, female    DOB: 1950-08-20, 64 y.o.   MRN: [id number]  HPI   Regina Horton     MRN: [id number]      DOB: 11/11/1950   HPI Regina Horton is here for follow up and re-evaluation of chronic medical conditions, medication management and review of any available recent lab and radiology data.  Preventive health is updated, specifically  Cancer screening and Immunization.   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  Overall reports good response to current medications, however , requests injections for her back pain , which she finds very helpful   ROS Denies recent fever or chills. Denies sinus pressure, nasal congestion, ear pain or sore throat. Denies chest congestion, productive cough or wheezing. Denies chest pains, palpitations and leg swelling Denies abdominal pain, nausea, vomiting,diarrhea or constipation.   Denies dysuria, frequency, hesitancy or incontinence.  Denies headaches, seizures, numbness, or tingling. Denies depression, anxiety or insomnia. Denies skin break down or rash.   PE  BP 124/76 mmHg  Pulse 68  Resp 18  Ht 5\' 4"  (1.626 m)  Wt 203 lb (92.08 kg)  BMI 34.83 kg/m2  SpO2 97%  Patient alert and oriented and in no cardiopulmonary distress.  HEENT: No facial asymmetry, EOMI,   oropharynx pink and moist.  Neck supple no JVD, no mass.  Chest: Clear to auscultation bilaterally.  CVS: S1, S2 no murmurs, no S3.Regular rate.  ABD: Soft non tender.   Ext: No edema  MS: Decreased  ROM spine, adequate in shoulders, hips and knees.  Skin: Intact, no ulcerations or rash noted.  Psych: Good eye contact, normal affect. Memory intact not anxious or depressed appearing.  CNS: CN 2-12 intact, power,  normal throughout.no focal deficits noted.   Assessment & Plan   Need for  prophylactic vaccination and inoculation against influenza After obtaining informed consent, the vaccine is  administered by LPN.   Bilateral low back pain with sciatica Uncontrolled. Depo medrol administered IM in the office , to be followed by a short course of oral prednisone .   Hypothyroidism Controlled, no change in medication   Essential hypertension Controlled, no change in medication DASH diet and commitment to daily physical activity for a minimum of 30 minutes discussed and encouraged, as a part of hypertension management. The importance of attaining a healthy weight is also discussed.  BP/Weight 05/16/2015 01/09/2015 10/28/2014 10/26/2014 07/07/2014 05/04/2014 [id number]  Systolic BP A999333 123XX123 123XX123 - 0000000 99991111 99991111  Diastolic BP 76 70 60 - 66 70 68  Wt. (Lbs) 203 211.12 - 197 209.12 212.2 211  BMI 34.83 36.22 - 32.78 35.88 36.41 36.2        Morbid obesity Improved. Patient re-educated about  the importance of commitment to a  minimum of 150 minutes of exercise per week.  The importance of healthy food choices with portion control discussed. Encouraged to start a food diary, count calories and to consider  joining a support group. Sample diet sheets offered. Goals set by the patient for the next several months.   Weight /BMI 05/16/2015 01/09/2015 10/26/2014  WEIGHT 203 lb 211 lb 1.9 oz 197 lb  HEIGHT - 5\' 4"  5\' 5"   BMI 34.83 kg/m2 36.22 kg/m2 32.78 kg/m2    Current exercise per week 60 minutes.  At high risk for falls Home safety reviewed   IGT (impaired glucose tolerance) Patient educated about the importance of limiting  Carbohydrate intake , the need to commit to daily physical activity as able , and to commit weight loss. The fact that changes in all these areas will reduce or eliminate all together the development of diabetes is stressed.   Diabetic Labs Latest Ref Rng 05/12/2015 01/09/2015 10/28/2014 10/27/2014 10/25/2014  HbA1c <5.7 % - - - - -  Chol 125 - 200 mg/dL  141 - - - -  HDL >=46 mg/dL 47 - - - -  Calc LDL <130 mg/dL 64 - - - -  Triglycerides <150 mg/dL 152(H) - - - -  Creatinine 0.50 - 0.99 mg/dL 1.44(H) 1.47(H) 1.15(H) 1.11(H) 1.30(H)   BP/Weight 05/16/2015 01/09/2015 10/28/2014 10/26/2014 07/07/2014 05/04/2014 [id number]  Systolic BP A999333 123XX123 [id number] - 0000000 [id number] [id number]  Diastolic BP 76 70 60 - 66 70 68  Wt. (Lbs) 203 211.12 - 197 209.12 212.2 211  BMI 34.83 36.22 - 32.78 35.88 36.41 36.2   No flowsheet data found.     Anxiety and depression Controlled, no change in medication   Hyperlipidemia LDL goal <100 Hyperlipidemia:Low fat diet discussed and encouraged.   Lipid Panel  Lab Results  Component Value Date   CHOL 141 05/12/2015   HDL 47 05/12/2015   LDLCALC 64 05/12/2015   TRIG 152* 05/12/2015   CHOLHDL 3.0 05/12/2015   Low fat diet encouraged, no med change     GERD Controlled, no change in medication   ALLERGIC RHINITIS Controlled, no change in medication        Review of Systems     Objective:   Physical Exam        Assessment & Plan:

## 2015-05-19 ENCOUNTER — Other Ambulatory Visit: Payer: Self-pay

## 2015-05-19 MED ORDER — OXYCODONE-ACETAMINOPHEN 10-325 MG PO TABS: ORAL_TABLET | ORAL | Status: DC

## 2015-05-19 MED ORDER — FENTANYL 50 MCG/HR TD PT72: 50.0000 ug | MEDICATED_PATCH | TRANSDERMAL | Status: DC

## 2015-05-22 NOTE — Assessment & Plan Note (Signed)
Hyperlipidemia:Low fat diet discussed and encouraged.   Lipid Panel  Lab Results  Component Value Date   CHOL 141 05/12/2015   HDL 47 05/12/2015   LDLCALC 64 05/12/2015   TRIG 152* 05/12/2015   CHOLHDL 3.0 05/12/2015   Low fat diet encouraged, no med change

## 2015-05-22 NOTE — Assessment & Plan Note (Signed)
Controlled, no change in medication  

## 2015-05-22 NOTE — Assessment & Plan Note (Signed)
After obtaining informed consent, the vaccine is  administered by LPN.  

## 2015-05-22 NOTE — Assessment & Plan Note (Signed)
Improved. Patient re-educated about  the importance of commitment to a  minimum of 150 minutes of exercise per week.  The importance of healthy food choices with portion control discussed. Encouraged to start a food diary, count calories and to consider  joining a support group. Sample diet sheets offered. Goals set by the patient for the next several months.   Weight /BMI 05/16/2015 01/09/2015 10/26/2014  WEIGHT 203 lb 211 lb 1.9 oz 197 lb  HEIGHT - 5\' 4"  5\' 5"   BMI 34.83 kg/m2 36.22 kg/m2 32.78 kg/m2    Current exercise per week 60 minutes.

## 2015-05-22 NOTE — Assessment & Plan Note (Signed)
Patient educated about the importance of limiting  Carbohydrate intake , the need to commit to daily physical activity as able , and to commit weight loss. The fact that changes in all these areas will reduce or eliminate all together the development of diabetes is stressed.   Diabetic Labs Latest Ref Rng 05/12/2015 01/09/2015 10/28/2014 10/27/2014 10/25/2014  HbA1c <5.7 % - - - - -  Chol 125 - 200 mg/dL 141 - - - -  HDL >=46 mg/dL 47 - - - -  Calc LDL <130 mg/dL 64 - - - -  Triglycerides <150 mg/dL 152(H) - - - -  Creatinine 0.50 - 0.99 mg/dL 1.44(H) 1.47(H) 1.15(H) 1.11(H) 1.30(H)   BP/Weight 05/16/2015 01/09/2015 10/28/2014 10/26/2014 07/07/2014 05/04/2014 [id number]  Systolic BP A999333 123XX123 123XX123 - 0000000 [id number] [id number]  Diastolic BP 76 70 60 - 66 70 68  Wt. (Lbs) 203 211.12 - 197 209.12 212.2 211  BMI 34.83 36.22 - 32.78 35.88 36.41 36.2   No flowsheet data found.

## 2015-05-22 NOTE — Assessment & Plan Note (Signed)
Controlled, no change in medication DASH diet and commitment to daily physical activity for a minimum of 30 minutes discussed and encouraged, as a part of hypertension management. The importance of attaining a healthy weight is also discussed.  BP/Weight 05/16/2015 01/09/2015 10/28/2014 10/26/2014 07/07/2014 05/04/2014 [id number]  Systolic BP A999333 123XX123 [id number] - 0000000 [id number]  Diastolic BP 76 70 60 - 66 70 68  Wt. (Lbs) 203 211.12 - 197 209.12 212.2 211  BMI 34.83 36.22 - 32.78 35.88 36.41 36.2

## 2015-05-22 NOTE — Assessment & Plan Note (Addendum)
Uncontrolled. Depo medrol administered IM in the office , to be followed by a short course of oral prednisone .

## 2015-05-22 NOTE — Assessment & Plan Note (Signed)
Home safety reviewed 

## 2015-06-05 DIAGNOSIS — H25813 Combined forms of age-related cataract, bilateral: Secondary | ICD-10-CM | POA: Diagnosis not present

## 2015-06-05 DIAGNOSIS — H25811 Combined forms of age-related cataract, right eye: Secondary | ICD-10-CM | POA: Diagnosis not present

## 2015-06-05 DIAGNOSIS — H40011 Open angle with borderline findings, low risk, right eye: Secondary | ICD-10-CM | POA: Diagnosis not present

## 2015-06-05 DIAGNOSIS — H5213 Myopia, bilateral: Secondary | ICD-10-CM | POA: Diagnosis not present

## 2015-06-08 NOTE — Patient Instructions (Signed)
Your procedure is scheduled on: 06/15/2015  Report to [facility] at  850  AM.  Call this number if you have problems the morning of surgery: [phone]   Do not eat food or drink liquids :After Midnight.      Take these medicines the morning of surgery with A SIP OF WATER: valium, cardiazem, cymbalta, gabapentin, metoprolol, synthroid,percocet, deltasone, xyzal. May wear duragesic patch.   Do not wear jewelry, make-up or nail polish.  Do not wear lotions, powders, or perfumes. You may wear deodorant.  Do not shave 48 hours prior to surgery.  Do not bring valuables to the hospital.  Contacts, dentures or bridgework may not be worn into surgery.  Leave suitcase in the car. After surgery it may be brought to your room.  For patients admitted to the hospital, checkout time is 11:00 AM the day of discharge.   Patients discharged the day of surgery will not be allowed to drive home.  :     Please read over the following fact sheets that you were given: Coughing and Deep Breathing, Surgical Site Infection Prevention, Anesthesia Post-op Instructions and Care and Recovery After Surgery    Cataract A cataract is a clouding of the lens of the eye. When a lens becomes cloudy, vision is reduced based on the degree and nature of the clouding. Many cataracts reduce vision to some degree. Some cataracts make people more near-sighted as they develop. Other cataracts increase glare. Cataracts that are ignored and become worse can sometimes look white. The white color can be seen through the pupil. CAUSES   Aging. However, cataracts may occur at any age, even in newborns.   Certain drugs.   Trauma to the eye.   Certain diseases such as diabetes.   Specific eye diseases such as chronic inflammation inside the eye or a sudden attack of a rare form of glaucoma.   Inherited or acquired medical problems.  SYMPTOMS   Gradual, progressive drop in vision in the affected eye.   Severe, rapid visual  loss. This most often happens when trauma is the cause.  DIAGNOSIS  To detect a cataract, an eye doctor examines the lens. Cataracts are best diagnosed with an exam of the eyes with the pupils enlarged (dilated) by drops.  TREATMENT  For an early cataract, vision may improve by using different eyeglasses or stronger lighting. If that does not help your vision, surgery is the only effective treatment. A cataract needs to be surgically removed when vision loss interferes with your everyday activities, such as driving, reading, or watching TV. A cataract may also have to be removed if it prevents examination or treatment of another eye problem. Surgery removes the cloudy lens and usually replaces it with a substitute lens (intraocular lens, IOL).  At a time when both you and your doctor agree, the cataract will be surgically removed. If you have cataracts in both eyes, only one is usually removed at a time. This allows the operated eye to heal and be out of danger from any possible problems after surgery (such as infection or poor wound healing). In rare cases, a cataract may be doing damage to your eye. In these cases, your caregiver may advise surgical removal right away. The vast majority of people who have cataract surgery have better vision afterward. HOME CARE INSTRUCTIONS  If you are not planning surgery, you may be asked to do the following:  Use different eyeglasses.   Use stronger or brighter lighting.  Ask your eye doctor about reducing your medicine dose or changing medicines if it is thought that a medicine caused your cataract. Changing medicines does not make the cataract go away on its own.   Become familiar with your surroundings. Poor vision can lead to injury. Avoid bumping into things on the affected side. You are at a higher risk for tripping or falling.   Exercise extreme care when driving or operating machinery.   Wear sunglasses if you are sensitive to bright light or  experiencing problems with glare.  SEEK IMMEDIATE MEDICAL CARE IF:   You have a worsening or sudden vision loss.   You notice redness, swelling, or increasing pain in the eye.   You have a fever.  Document Released: 08/05/2005 Document Revised: 07/25/2011 Document Reviewed: 03/29/2011 [facility] Patient Information 2012 [place].PATIENT INSTRUCTIONS POST-ANESTHESIA  IMMEDIATELY FOLLOWING SURGERY:  Do not drive or operate machinery for the first twenty four hours after surgery.  Do not make any important decisions for twenty four hours after surgery or while taking narcotic pain medications or sedatives.  If you develop intractable nausea and vomiting or a severe headache please notify your doctor immediately.  FOLLOW-UP:  Please make an appointment with your surgeon as instructed. You do not need to follow up with anesthesia unless specifically instructed to do so.  WOUND CARE INSTRUCTIONS (if applicable):  Keep a dry clean dressing on the anesthesia/puncture wound site if there is drainage.  Once the wound has quit draining you may leave it open to air.  Generally you should leave the bandage intact for twenty four hours unless there is drainage.  If the epidural site drains for more than 36-48 hours please call the anesthesia department.  QUESTIONS?:  Please feel free to call your physician or the hospital operator if you have any questions, and they will be happy to assist you.

## 2015-06-09 ENCOUNTER — Encounter (HOSPITAL_COMMUNITY): Payer: Self-pay

## 2015-06-09 ENCOUNTER — Encounter (HOSPITAL_COMMUNITY)
Admission: RE | Admit: 2015-06-09 | Discharge: 2015-06-09 | Disposition: A | Discharge status: Home / Self Care | Payer: Medicare Other | Source: Ambulatory Visit | Attending: Ophthalmology | Admitting: Ophthalmology

## 2015-06-09 DIAGNOSIS — H2511 Age-related nuclear cataract, right eye: Secondary | ICD-10-CM | POA: Diagnosis not present

## 2015-06-09 DIAGNOSIS — Z01818 Encounter for other preprocedural examination: Secondary | ICD-10-CM | POA: Diagnosis not present

## 2015-06-09 LAB — CBC WITH DIFFERENTIAL/PLATELET
Basophils Absolute: 0 10*3/uL (ref 0.0–0.1)
Basophils Relative: 0 %
EOS ABS: 0.1 10*3/uL (ref 0.0–0.7)
Eosinophils Relative: 1 %
HEMATOCRIT: 39.4 % (ref 36.0–46.0)
HEMOGLOBIN: 13.3 g/dL (ref 12.0–15.0)
LYMPHS ABS: 2.5 10*3/uL (ref 0.7–4.0)
Lymphocytes Relative: 25 %
MCH: 32.5 pg (ref 26.0–34.0)
MCHC: 33.8 g/dL (ref 30.0–36.0)
MCV: 96.3 fL (ref 78.0–100.0)
MONOS PCT: 7 %
Monocytes Absolute: 0.7 10*3/uL (ref 0.1–1.0)
NEUTROS ABS: 6.4 10*3/uL (ref 1.7–7.7)
NEUTROS PCT: 67 %
Platelets: 292 10*3/uL (ref 150–400)
RBC: 4.09 MIL/uL (ref 3.87–5.11)
RDW: 13.4 % (ref 11.5–15.5)
WBC: 9.8 10*3/uL (ref 4.0–10.5)

## 2015-06-09 LAB — BASIC METABOLIC PANEL
Anion gap: 6 (ref 5–15)
BUN: 21 mg/dL — AB (ref 6–20)
CHLORIDE: 106 mmol/L (ref 101–111)
CO2: 30 mmol/L (ref 22–32)
CREATININE: 1.45 mg/dL — AB (ref 0.44–1.00)
Calcium: 9.6 mg/dL (ref 8.9–10.3)
GFR calc Af Amer: 42 mL/min — ABNORMAL LOW (ref >60)
GFR calc non Af Amer: 37 mL/min — ABNORMAL LOW (ref >60)
Glucose, Bld: 91 mg/dL (ref 65–99)
POTASSIUM: 3.8 mmol/L (ref 3.5–5.1)
SODIUM: 142 mmol/L (ref 135–145)

## 2015-06-15 ENCOUNTER — Ambulatory Visit (HOSPITAL_COMMUNITY): Payer: Medicare Other | Admitting: Anesthesiology

## 2015-06-15 ENCOUNTER — Ambulatory Visit (HOSPITAL_COMMUNITY)
Admission: RE | Admit: 2015-06-15 | Discharge: 2015-06-15 | Disposition: A | Discharge status: Home / Self Care | Payer: Medicare Other | Source: Ambulatory Visit | Attending: Ophthalmology | Admitting: Ophthalmology

## 2015-06-15 ENCOUNTER — Encounter (HOSPITAL_COMMUNITY): Payer: Self-pay | Admitting: *Deleted

## 2015-06-15 ENCOUNTER — Encounter (HOSPITAL_COMMUNITY)
Admission: RE | Disposition: A | Discharge status: Home / Self Care | Payer: Self-pay | Source: Ambulatory Visit | Attending: Ophthalmology

## 2015-06-15 DIAGNOSIS — K219 Gastro-esophageal reflux disease without esophagitis: Secondary | ICD-10-CM | POA: Insufficient documentation

## 2015-06-15 DIAGNOSIS — F418 Other specified anxiety disorders: Secondary | ICD-10-CM | POA: Diagnosis not present

## 2015-06-15 DIAGNOSIS — M797 Fibromyalgia: Secondary | ICD-10-CM | POA: Insufficient documentation

## 2015-06-15 DIAGNOSIS — Z79899 Other long term (current) drug therapy: Secondary | ICD-10-CM | POA: Insufficient documentation

## 2015-06-15 DIAGNOSIS — I10 Essential (primary) hypertension: Secondary | ICD-10-CM | POA: Insufficient documentation

## 2015-06-15 DIAGNOSIS — H269 Unspecified cataract: Secondary | ICD-10-CM | POA: Diagnosis not present

## 2015-06-15 DIAGNOSIS — H25811 Combined forms of age-related cataract, right eye: Secondary | ICD-10-CM | POA: Diagnosis not present

## 2015-06-15 DIAGNOSIS — Z87891 Personal history of nicotine dependence: Secondary | ICD-10-CM | POA: Insufficient documentation

## 2015-06-15 HISTORY — PX: CATARACT EXTRACTION W/PHACO: SHX586

## 2015-06-15 SURGERY — PHACOEMULSIFICATION, CATARACT, WITH IOL INSERTION
Anesthesia: Monitor Anesthesia Care | Site: Eye | Laterality: Right

## 2015-06-15 MED ORDER — PHENYLEPHRINE HCL 2.5 % OP SOLN
OPHTHALMIC | Status: AC
  Filled 2015-06-15: qty 15

## 2015-06-15 MED ORDER — LIDOCAINE HCL 3.5 % OP GEL
1.0000 "application " | Freq: Once | OPHTHALMIC | Status: AC
  Administered 2015-06-15: 1 via OPHTHALMIC

## 2015-06-15 MED ORDER — NEOMYCIN-POLYMYXIN-DEXAMETH 3.5-10000-0.1 OP SUSP
OPHTHALMIC | Status: DC | PRN
  Administered 2015-06-15: 2 [drp] via OPHTHALMIC

## 2015-06-15 MED ORDER — FENTANYL CITRATE (PF) 100 MCG/2ML IJ SOLN
INTRAMUSCULAR | Status: AC
  Filled 2015-06-15: qty 2

## 2015-06-15 MED ORDER — LIDOCAINE HCL 3.5 % OP GEL
OPHTHALMIC | Status: AC
  Filled 2015-06-15: qty 1

## 2015-06-15 MED ORDER — EPINEPHRINE HCL 1 MG/ML IJ SOLN
INTRAMUSCULAR | Status: AC
  Filled 2015-06-15: qty 1

## 2015-06-15 MED ORDER — MIDAZOLAM HCL 2 MG/2ML IJ SOLN
INTRAMUSCULAR | Status: AC
  Filled 2015-06-15: qty 2

## 2015-06-15 MED ORDER — LIDOCAINE HCL (PF) 1 % IJ SOLN
INTRAMUSCULAR | Status: AC
  Filled 2015-06-15: qty 2

## 2015-06-15 MED ORDER — POVIDONE-IODINE 5 % OP SOLN
OPHTHALMIC | Status: DC | PRN
  Administered 2015-06-15: 1 via OPHTHALMIC

## 2015-06-15 MED ORDER — FENTANYL CITRATE (PF) 100 MCG/2ML IJ SOLN
25.0000 ug | INTRAMUSCULAR | Status: AC
  Administered 2015-06-15: 25 ug via INTRAVENOUS

## 2015-06-15 MED ORDER — PHENYLEPHRINE HCL 2.5 % OP SOLN
1.0000 [drp] | OPHTHALMIC | Status: AC
  Administered 2015-06-15 (×3): 1 [drp] via OPHTHALMIC

## 2015-06-15 MED ORDER — CYCLOPENTOLATE-PHENYLEPHRINE OP SOLN OPTIME - NO CHARGE
OPHTHALMIC | Status: AC
  Filled 2015-06-15: qty 2

## 2015-06-15 MED ORDER — NEOMYCIN-POLYMYXIN-DEXAMETH 3.5-10000-0.1 OP SUSP
OPHTHALMIC | Status: AC
  Filled 2015-06-15: qty 5

## 2015-06-15 MED ORDER — LACTATED RINGERS IV SOLN
INTRAVENOUS | Status: DC
  Administered 2015-06-15: 10:00:00 via INTRAVENOUS

## 2015-06-15 MED ORDER — BSS IO SOLN
INTRAOCULAR | Status: DC | PRN
  Administered 2015-06-15: 15 mL

## 2015-06-15 MED ORDER — EPINEPHRINE HCL 1 MG/ML IJ SOLN
INTRAOCULAR | Status: DC | PRN
  Administered 2015-06-15: 500 mL

## 2015-06-15 MED ORDER — LIDOCAINE 3.5 % OP GEL OPTIME - NO CHARGE
OPHTHALMIC | Status: DC | PRN
  Administered 2015-06-15: 2 [drp] via OPHTHALMIC

## 2015-06-15 MED ORDER — PROVISC 10 MG/ML IO SOLN
INTRAOCULAR | Status: DC | PRN
  Administered 2015-06-15: 0.85 mL via INTRAOCULAR

## 2015-06-15 MED ORDER — TETRACAINE HCL 0.5 % OP SOLN
1.0000 [drp] | OPHTHALMIC | Status: AC
  Administered 2015-06-15 (×3): 1 [drp] via OPHTHALMIC

## 2015-06-15 MED ORDER — LIDOCAINE HCL (PF) 1 % IJ SOLN
INTRAMUSCULAR | Status: DC | PRN
  Administered 2015-06-15: .4 mL

## 2015-06-15 MED ORDER — TETRACAINE HCL 0.5 % OP SOLN
OPHTHALMIC | Status: AC
  Filled 2015-06-15: qty 2

## 2015-06-15 MED ORDER — CYCLOPENTOLATE-PHENYLEPHRINE 0.2-1 % OP SOLN
1.0000 [drp] | OPHTHALMIC | Status: AC
  Administered 2015-06-15 (×3): 1 [drp] via OPHTHALMIC

## 2015-06-15 MED ORDER — MIDAZOLAM HCL 2 MG/2ML IJ SOLN
1.0000 mg | INTRAMUSCULAR | Status: DC | PRN
  Administered 2015-06-15: 2 mg via INTRAVENOUS

## 2015-06-15 SURGICAL SUPPLY — 11 items
CLOTH BEACON ORANGE TIMEOUT ST (SAFETY) ×1 IMPLANT
EYE SHIELD UNIVERSAL CLEAR (GAUZE/BANDAGES/DRESSINGS) ×1 IMPLANT
GLOVE BIOGEL PI IND STRL 6.5 (GLOVE) IMPLANT
GLOVE BIOGEL PI INDICATOR 6.5 (GLOVE) ×1
GLOVE EXAM NITRILE MD LF STRL (GLOVE) ×1 IMPLANT
PAD ARMBOARD 7.5X6 YLW CONV (MISCELLANEOUS) ×1 IMPLANT
SIGHTPATH CAT PROC W REG LENS (Ophthalmic Related) ×2 IMPLANT
SYRINGE LUER LOK 1CC (MISCELLANEOUS) ×1 IMPLANT
TAPE SURG TRANSPORE 1 IN (GAUZE/BANDAGES/DRESSINGS) IMPLANT
TAPE SURGICAL TRANSPORE 1 IN (GAUZE/BANDAGES/DRESSINGS) ×1
WATER STERILE IRR 250ML POUR (IV SOLUTION) ×1 IMPLANT

## 2015-06-15 NOTE — Transfer of Care (Signed)
Immediate Anesthesia Transfer of Care Note  Patient: Regina Horton  Procedure(s) Performed: Procedure(s) (LRB): CATARACT EXTRACTION PHACO AND INTRAOCULAR LENS PLACEMENT (IOC) (Right)  Patient Location: Shortstay  Anesthesia Type: MAC  Level of Consciousness: awake  Airway & Oxygen Therapy: Patient Spontanous Breathing   Post-op Assessment: Report given to PACU RN, Post -op Vital signs reviewed and stable and Patient moving all extremities  Post vital signs: Reviewed and stable  Complications: No apparent anesthesia complications

## 2015-06-15 NOTE — Anesthesia Postprocedure Evaluation (Signed)
  Anesthesia Post-op Note  Patient: Regina Horton  Procedure(s) Performed: Procedure(s) (LRB): CATARACT EXTRACTION PHACO AND INTRAOCULAR LENS PLACEMENT (IOC) (Right)  Patient Location:  Short Stay  Anesthesia Type: MAC  Level of Consciousness: awake  Airway and Oxygen Therapy: Patient Spontanous Breathing  Post-op Pain: none  Post-op Assessment: Post-op Vital signs reviewed, Patient's Cardiovascular Status Stable, Respiratory Function Stable, Patent Airway, No signs of Nausea or vomiting and Pain level controlled  Post-op Vital Signs: Reviewed and stable  Complications: No apparent anesthesia complications

## 2015-06-15 NOTE — Anesthesia Procedure Notes (Signed)
Procedure Name: MAC Date/Time: 06/15/2015 10:15 AM Performed by: Vista Deck Pre-anesthesia Checklist: Patient identified, Emergency Drugs available, Suction available, Timeout performed and Patient being monitored Patient Re-evaluated:Patient Re-evaluated prior to inductionOxygen Delivery Method: Nasal Cannula

## 2015-06-15 NOTE — H&P (Signed)
I have reviewed the H&P, the patient was re-examined, and I have identified no interval changes in medical condition and plan of care since the history and physical of record  

## 2015-06-15 NOTE — Op Note (Signed)
Date of Admission: 06/15/2015  Date of Surgery: 06/15/2015   Pre-Op Dx: Cataract Right Eye  Post-Op Dx: Senile Combined Cataract Right  Eye,  Dx Code [id number]  Surgeon: Tonny Branch, M.D.  Assistants: None  Anesthesia: Topical with MAC  Indications: Painless, progressive loss of vision with compromise of daily activities.  Surgery: Cataract Extraction with Intraocular lens Implant Right Eye  Discription: The patient had dilating drops and viscous lidocaine placed into the Right eye in the pre-op holding area. After transfer to the operating room, a time out was performed. The patient was then prepped and draped. Beginning with a 72 degree blade a paracentesis port was made at the surgeon's 2 o'clock position. The anterior chamber was then filled with 1% non-preserved lidocaine. This was followed by filling the anterior chamber with Provisc.  A 2.21mm keratome blade was used to make a clear corneal incision at the temporal limbus.  A bent cystatome needle was used to create a continuous tear capsulotomy. Hydrodissection was performed with balanced salt solution on a Fine canula. The lens nucleus was then removed using the phacoemulsification handpiece. Residual cortex was removed with the I&A handpiece. The anterior chamber and capsular bag were refilled with Provisc. A posterior chamber intraocular lens was placed into the capsular bag with it's injector. The implant was positioned with the Kuglan hook. The Provisc was then removed from the anterior chamber and capsular bag with the I&A handpiece. Stromal hydration of the main incision and paracentesis port was performed with BSS on a Fine canula. The wounds were tested for leak which was negative. The patient tolerated the procedure well. There were no operative complications. The patient was then transferred to the recovery room in stable condition.  Complications: None  Specimen: None  EBL: None  Prosthetic device: Hoya iSert 250, power 18.0  D, SN [id number].

## 2015-06-15 NOTE — Discharge Instructions (Signed)
Anesthesia, Adult, Care After °Refer to this sheet in the next few weeks. These instructions provide you with information on caring for yourself after your procedure. Your health care provider may also give you more specific instructions. Your treatment has been planned according to current medical practices, but problems sometimes occur. Call your health care provider if you have any problems or questions after your procedure. °WHAT TO EXPECT AFTER THE PROCEDURE °After the procedure, it is typical to experience: °· Sleepiness. °· Nausea and vomiting. °HOME CARE INSTRUCTIONS °· For the first 24 hours after general anesthesia: °¨ Have a responsible person with you. °¨ Do not drive a car. If you are alone, do not take public transportation. °¨ Do not drink alcohol. °¨ Do not take medicine that has not been prescribed by your health care provider. °¨ Do not sign important papers or make important decisions. °¨ You may resume a normal diet and activities as directed by your health care provider. °· Change bandages (dressings) as directed. °· If you have questions or problems that seem related to general anesthesia, call the hospital and ask for the anesthetist or anesthesiologist on call. °SEEK MEDICAL CARE IF: °· You have nausea and vomiting that continue the day after anesthesia. °· You develop a rash. °SEEK IMMEDIATE MEDICAL CARE IF:  °· You have difficulty breathing. °· You have chest pain. °· You have any allergic problems. °  °This information is not intended to replace advice given to you by your health care provider. Make sure you discuss any questions you have with your health care provider. °  °Document Released: 11/11/2000 Document Revised: 08/26/2014 Document Reviewed: 12/04/2011 °Elsevier Interactive Patient Education ©2016 [employer]. ° °

## 2015-06-15 NOTE — Anesthesia Preprocedure Evaluation (Signed)
Anesthesia Evaluation  Patient identified by MRN, date of birth, ID band Patient awake    Reviewed: Allergy & Precautions, NPO status , Patient's Chart, lab work & pertinent test results  Airway Mallampati: I  TM Distance: >3 FB     Dental  (+) Teeth Intact   Pulmonary former smoker,    breath sounds clear to auscultation       Cardiovascular hypertension, Pt. on medications + angina (none recently) + Valvular Problems/Murmurs AS and MR  Rhythm:Regular Rate:Normal     Neuro/Psych PSYCHIATRIC DISORDERS Anxiety Depression    GI/Hepatic GERD  Controlled and Medicated,  Endo/Other  Hypothyroidism   Renal/GU      Musculoskeletal  (+) Fibromyalgia -, narcotic dependent  Abdominal   Peds  Hematology   Anesthesia Other Findings Chronic pain - fentanyl patch  Reproductive/Obstetrics                             Anesthesia Physical Anesthesia Plan  ASA: III  Anesthesia Plan: MAC   Post-op Pain Management:    Induction: Intravenous  Airway Management Planned: Nasal Cannula  Additional Equipment:   Intra-op Plan:   Post-operative Plan:   Informed Consent: I have reviewed the patients History and Physical, chart, labs and discussed the procedure including the risks, benefits and alternatives for the proposed anesthesia with the patient or authorized representative who has indicated his/her understanding and acceptance.     Plan Discussed with:   Anesthesia Plan Comments:         Anesthesia Quick Evaluation

## 2015-06-16 ENCOUNTER — Encounter (HOSPITAL_COMMUNITY): Payer: Self-pay | Admitting: Ophthalmology

## 2015-06-26 ENCOUNTER — Other Ambulatory Visit: Payer: Self-pay | Admitting: Family Medicine

## 2015-06-26 DIAGNOSIS — H25812 Combined forms of age-related cataract, left eye: Secondary | ICD-10-CM | POA: Diagnosis not present

## 2015-06-30 ENCOUNTER — Other Ambulatory Visit: Payer: Self-pay

## 2015-06-30 MED ORDER — FENTANYL 50 MCG/HR TD PT72: 50.0000 ug | MEDICATED_PATCH | TRANSDERMAL | Status: DC

## 2015-06-30 MED ORDER — OXYCODONE-ACETAMINOPHEN 10-325 MG PO TABS: ORAL_TABLET | ORAL | Status: DC

## 2015-07-11 ENCOUNTER — Encounter (HOSPITAL_COMMUNITY)
Admission: RE | Admit: 2015-07-11 | Discharge: 2015-07-11 | Disposition: A | Discharge status: Home / Self Care | Payer: Medicare Other | Source: Ambulatory Visit | Attending: Ophthalmology | Admitting: Ophthalmology

## 2015-07-11 ENCOUNTER — Encounter (HOSPITAL_COMMUNITY): Payer: Self-pay

## 2015-07-17 ENCOUNTER — Encounter (HOSPITAL_COMMUNITY): Payer: Self-pay | Admitting: Ophthalmology

## 2015-07-17 ENCOUNTER — Encounter (HOSPITAL_COMMUNITY)
Admission: RE | Disposition: A | Discharge status: Home / Self Care | Payer: Self-pay | Source: Ambulatory Visit | Attending: Ophthalmology

## 2015-07-17 ENCOUNTER — Ambulatory Visit (HOSPITAL_COMMUNITY)
Admission: RE | Admit: 2015-07-17 | Discharge: 2015-07-17 | Disposition: A | Discharge status: Home / Self Care | Payer: Medicare Other | Source: Ambulatory Visit | Attending: Ophthalmology | Admitting: Ophthalmology

## 2015-07-17 ENCOUNTER — Ambulatory Visit (HOSPITAL_COMMUNITY): Payer: Medicare Other | Admitting: Anesthesiology

## 2015-07-17 DIAGNOSIS — Z87891 Personal history of nicotine dependence: Secondary | ICD-10-CM | POA: Insufficient documentation

## 2015-07-17 DIAGNOSIS — M1991 Primary osteoarthritis, unspecified site: Secondary | ICD-10-CM | POA: Insufficient documentation

## 2015-07-17 DIAGNOSIS — Z79899 Other long term (current) drug therapy: Secondary | ICD-10-CM | POA: Diagnosis not present

## 2015-07-17 DIAGNOSIS — H25812 Combined forms of age-related cataract, left eye: Secondary | ICD-10-CM | POA: Insufficient documentation

## 2015-07-17 DIAGNOSIS — E039 Hypothyroidism, unspecified: Secondary | ICD-10-CM | POA: Diagnosis not present

## 2015-07-17 DIAGNOSIS — F418 Other specified anxiety disorders: Secondary | ICD-10-CM | POA: Insufficient documentation

## 2015-07-17 DIAGNOSIS — K219 Gastro-esophageal reflux disease without esophagitis: Secondary | ICD-10-CM | POA: Insufficient documentation

## 2015-07-17 DIAGNOSIS — H269 Unspecified cataract: Secondary | ICD-10-CM | POA: Diagnosis not present

## 2015-07-17 DIAGNOSIS — M797 Fibromyalgia: Secondary | ICD-10-CM | POA: Insufficient documentation

## 2015-07-17 DIAGNOSIS — I10 Essential (primary) hypertension: Secondary | ICD-10-CM | POA: Insufficient documentation

## 2015-07-17 HISTORY — PX: CATARACT EXTRACTION W/PHACO: SHX586

## 2015-07-17 SURGERY — PHACOEMULSIFICATION, CATARACT, WITH IOL INSERTION
Anesthesia: Monitor Anesthesia Care | Site: Eye | Laterality: Left

## 2015-07-17 MED ORDER — FENTANYL CITRATE (PF) 100 MCG/2ML IJ SOLN: 25.0000 ug | INTRAMUSCULAR | Status: DC | PRN

## 2015-07-17 MED ORDER — PHENYLEPHRINE HCL 2.5 % OP SOLN
1.0000 [drp] | OPHTHALMIC | Status: AC
  Administered 2015-07-17 (×3): 1 [drp] via OPHTHALMIC

## 2015-07-17 MED ORDER — ONDANSETRON HCL 4 MG/2ML IJ SOLN: 4.0000 mg | Freq: Once | INTRAMUSCULAR | Status: DC | PRN

## 2015-07-17 MED ORDER — LIDOCAINE 3.5 % OP GEL OPTIME - NO CHARGE
OPHTHALMIC | Status: DC | PRN
  Administered 2015-07-17: 1 [drp] via OPHTHALMIC

## 2015-07-17 MED ORDER — MIDAZOLAM HCL 2 MG/2ML IJ SOLN
1.0000 mg | INTRAMUSCULAR | Status: DC | PRN
  Administered 2015-07-17: 2 mg via INTRAVENOUS

## 2015-07-17 MED ORDER — BSS IO SOLN
INTRAOCULAR | Status: DC | PRN
  Administered 2015-07-17: 15 mL

## 2015-07-17 MED ORDER — PROVISC 10 MG/ML IO SOLN
INTRAOCULAR | Status: DC | PRN
  Administered 2015-07-17: 0.85 mL via INTRAOCULAR

## 2015-07-17 MED ORDER — POVIDONE-IODINE 5 % OP SOLN
OPHTHALMIC | Status: DC | PRN
  Administered 2015-07-17: 1 via OPHTHALMIC

## 2015-07-17 MED ORDER — LIDOCAINE HCL 3.5 % OP GEL
1.0000 "application " | Freq: Once | OPHTHALMIC | Status: AC
  Administered 2015-07-17: 1 via OPHTHALMIC

## 2015-07-17 MED ORDER — LIDOCAINE HCL (PF) 1 % IJ SOLN
INTRAMUSCULAR | Status: DC | PRN
  Administered 2015-07-17: .5 mL

## 2015-07-17 MED ORDER — TETRACAINE HCL 0.5 % OP SOLN
1.0000 [drp] | OPHTHALMIC | Status: AC
  Administered 2015-07-17 (×3): 1 [drp] via OPHTHALMIC

## 2015-07-17 MED ORDER — CYCLOPENTOLATE-PHENYLEPHRINE 0.2-1 % OP SOLN
1.0000 [drp] | OPHTHALMIC | Status: AC
  Administered 2015-07-17 (×3): 1 [drp] via OPHTHALMIC

## 2015-07-17 MED ORDER — EPINEPHRINE HCL 1 MG/ML IJ SOLN
INTRAMUSCULAR | Status: AC
  Filled 2015-07-17: qty 1

## 2015-07-17 MED ORDER — LACTATED RINGERS IV SOLN
INTRAVENOUS | Status: DC
  Administered 2015-07-17: 10:00:00 via INTRAVENOUS

## 2015-07-17 MED ORDER — BSS IO SOLN
INTRAOCULAR | Status: DC | PRN
  Administered 2015-07-17: 500 mL

## 2015-07-17 MED ORDER — NEOMYCIN-POLYMYXIN-DEXAMETH 3.5-10000-0.1 OP SUSP
OPHTHALMIC | Status: DC | PRN
  Administered 2015-07-17: 2 [drp] via OPHTHALMIC

## 2015-07-17 MED ORDER — FENTANYL CITRATE (PF) 100 MCG/2ML IJ SOLN
25.0000 ug | INTRAMUSCULAR | Status: AC
  Administered 2015-07-17 (×2): 25 ug via INTRAVENOUS

## 2015-07-17 SURGICAL SUPPLY — 11 items
CLOTH BEACON ORANGE TIMEOUT ST (SAFETY) ×1 IMPLANT
EYE SHIELD UNIVERSAL CLEAR (GAUZE/BANDAGES/DRESSINGS) ×1 IMPLANT
GLOVE BIOGEL PI IND STRL 7.0 (GLOVE) IMPLANT
GLOVE BIOGEL PI INDICATOR 7.0 (GLOVE) ×1
GLOVE EXAM NITRILE MD LF STRL (GLOVE) ×1 IMPLANT
PAD ARMBOARD 7.5X6 YLW CONV (MISCELLANEOUS) ×1 IMPLANT
SIGHTPATH CAT PROC W REG LENS (Ophthalmic Related) ×2 IMPLANT
SYRINGE LUER LOK 1CC (MISCELLANEOUS) ×1 IMPLANT
TAPE SURG TRANSPORE 1 IN (GAUZE/BANDAGES/DRESSINGS) IMPLANT
TAPE SURGICAL TRANSPORE 1 IN (GAUZE/BANDAGES/DRESSINGS) ×1
WATER STERILE IRR 250ML POUR (IV SOLUTION) ×1 IMPLANT

## 2015-07-17 NOTE — Transfer of Care (Signed)
Immediate Anesthesia Transfer of Care Note  Patient: Regina Horton  Procedure(s) Performed: Procedure(s) (LRB): CATARACT EXTRACTION PHACO AND INTRAOCULAR LENS PLACEMENT (IOC) (Left)  Patient Location: Shortstay  Anesthesia Type: MAC  Level of Consciousness: awake  Airway & Oxygen Therapy: Patient Spontanous Breathing   Post-op Assessment: Report given to PACU RN, Post -op Vital signs reviewed and stable and Patient moving all extremities  Post vital signs: Reviewed and stable  Complications: No apparent anesthesia complications

## 2015-07-17 NOTE — Discharge Instructions (Signed)

## 2015-07-17 NOTE — Op Note (Signed)
Date of Admission: 07/17/2015  Date of Surgery: 07/17/2015   Pre-Op Dx: Cataract Left Eye  Post-Op Dx: Senile Combined Cataract Left  Eye,  Dx Code [id number]  Surgeon: Tonny Branch, M.D.  Assistants: None  Anesthesia: Topical with MAC  Indications: Painless, progressive loss of vision with compromise of daily activities.  Surgery: Cataract Extraction with Intraocular lens Implant Left Eye  Discription: The patient had dilating drops and viscous lidocaine placed into the Left eye in the pre-op holding area. After transfer to the operating room, a time out was performed. The patient was then prepped and draped. Beginning with a 40 degree blade a paracentesis port was made at the surgeon's 2 o'clock position. The anterior chamber was then filled with 1% non-preserved lidocaine. This was followed by filling the anterior chamber with Provisc.  A 2.32mm keratome blade was used to make a clear corneal incision at the temporal limbus.  A bent cystatome needle was used to create a continuous tear capsulotomy. Hydrodissection was performed with balanced salt solution on a Fine canula. The lens nucleus was then removed using the phacoemulsification handpiece. Residual cortex was removed with the I&A handpiece. The anterior chamber and capsular bag were refilled with Provisc. A posterior chamber intraocular lens was placed into the capsular bag with it's injector. The implant was positioned with the Kuglan hook. The Provisc was then removed from the anterior chamber and capsular bag with the I&A handpiece. Stromal hydration of the main incision and paracentesis port was performed with BSS on a Fine canula. The wounds were tested for leak which was negative. The patient tolerated the procedure well. There were no operative complications. The patient was then transferred to the recovery room in stable condition.  Complications: None  Specimen: None  EBL: None  Prosthetic device: Hoya iSert 250, power 18.5 D,  SN [id number].

## 2015-07-17 NOTE — Anesthesia Preprocedure Evaluation (Signed)
Anesthesia Evaluation  Patient identified by MRN, date of birth, ID band Patient awake    Reviewed: Allergy & Precautions, NPO status , Patient's Chart, lab work & pertinent test results  Airway Mallampati: I  TM Distance: >3 FB     Dental  (+) Teeth Intact   Pulmonary former smoker,    breath sounds clear to auscultation       Cardiovascular hypertension, Pt. on medications + angina (none recently) + Valvular Problems/Murmurs AS and MR  Rhythm:Regular Rate:Normal     Neuro/Psych PSYCHIATRIC DISORDERS Anxiety Depression    GI/Hepatic GERD  Controlled and Medicated,  Endo/Other  Hypothyroidism   Renal/GU      Musculoskeletal  (+) Fibromyalgia -, narcotic dependent  Abdominal   Peds  Hematology   Anesthesia Other Findings Chronic pain - fentanyl patch  Reproductive/Obstetrics                             Anesthesia Physical Anesthesia Plan  ASA: III  Anesthesia Plan: MAC   Post-op Pain Management:    Induction: Intravenous  Airway Management Planned: Nasal Cannula  Additional Equipment:   Intra-op Plan:   Post-operative Plan:   Informed Consent: I have reviewed the patients History and Physical, chart, labs and discussed the procedure including the risks, benefits and alternatives for the proposed anesthesia with the patient or authorized representative who has indicated his/her understanding and acceptance.     Plan Discussed with:   Anesthesia Plan Comments:         Anesthesia Quick Evaluation

## 2015-07-17 NOTE — H&P (Signed)
I have reviewed the H&P, the patient was re-examined, and I have identified no interval changes in medical condition and plan of care since the history and physical of record  

## 2015-07-17 NOTE — Anesthesia Postprocedure Evaluation (Signed)
  Anesthesia Post-op Note  Patient: Regina Horton  Procedure(s) Performed: Procedure(s) (LRB): CATARACT EXTRACTION PHACO AND INTRAOCULAR LENS PLACEMENT (IOC) (Left)  Patient Location:  Short Stay  Anesthesia Type: MAC  Level of Consciousness: awake  Airway and Oxygen Therapy: Patient Spontanous Breathing  Post-op Pain: none  Post-op Assessment: Post-op Vital signs reviewed, Patient's Cardiovascular Status Stable, Respiratory Function Stable, Patent Airway, No signs of Nausea or vomiting and Pain level controlled  Post-op Vital Signs: Reviewed and stable  Complications: No apparent anesthesia complications

## 2015-07-18 ENCOUNTER — Encounter (HOSPITAL_COMMUNITY): Payer: Self-pay | Admitting: Ophthalmology

## 2015-07-28 ENCOUNTER — Other Ambulatory Visit: Payer: Self-pay

## 2015-07-28 MED ORDER — FENTANYL 50 MCG/HR TD PT72: 50.0000 ug | MEDICATED_PATCH | TRANSDERMAL | Status: DC

## 2015-07-28 MED ORDER — OXYCODONE-ACETAMINOPHEN 10-325 MG PO TABS: ORAL_TABLET | ORAL | Status: DC

## 2015-07-31 ENCOUNTER — Other Ambulatory Visit: Payer: Self-pay | Admitting: Family Medicine

## 2015-08-10 ENCOUNTER — Encounter: Payer: Self-pay | Admitting: Endocrinology

## 2015-08-10 ENCOUNTER — Ambulatory Visit (INDEPENDENT_AMBULATORY_CARE_PROVIDER_SITE_OTHER): Payer: Medicare Other | Admitting: Endocrinology

## 2015-08-10 VITALS — BP 122/86 | HR 63 | Temp 97.8°F | Wt 201.0 lb

## 2015-08-10 DIAGNOSIS — E038 Other specified hypothyroidism: Secondary | ICD-10-CM | POA: Diagnosis not present

## 2015-08-10 MED ORDER — LEVOTHYROXINE SODIUM 75 MCG PO TABS: 75.0000 ug | ORAL_TABLET | Freq: Every day | ORAL | Status: DC

## 2015-08-10 NOTE — Patient Instructions (Signed)
We can skip the ultrasound for now.   i have sent a prescription to your pharmacy, to increase the thyroid pill.  Please have it rechecked in Feb with Dr Moshe Cipro.  Please return in 1 year.

## 2015-08-10 NOTE — Progress Notes (Signed)
Subjective:    Patient ID: Regina Horton, female    DOB: [birth year], 64 y.o.   MRN: [id number]  HPI Pt returns for f/u of a small multinodular goiter and hypothyroidism (both dx'ed 2011; f/u US in 2015 showed nodules were again too small to need bx; she has been on synthroid since 2011).  She does not notice any lump in the neck.   Past Medical History  Diagnosis Date  . Allergic rhinitis   . Anxiety   . Depression   . GERD (gastroesophageal reflux disease)   . Hypertension   . Chronic back pain   . Chronic constipation   . Palpitations   . Angina at rest [facility])   . Thyroid disease   . CKD (chronic kidney disease)   . Fibromyalgia   . Hypertension   . Hyperlipidemia   . Aortic sclerosis ([place])   . Mitral regurgitation   . Tricuspid regurgitation     Past Surgical History  Procedure Laterality Date  . Tonsillectomy  childhood   . Back surgery  2005  . Tubal ligation  1973  . US echocardiography  12/19/2008    mild mitral annular ca+,AOV mildly sclerotic,mild MR & TR  . Nm myocar perf wall motion  02/16/2009    normal  . Breast lumpectomy  1970    right   . Orif ankle fracture Left 10/26/2014    Procedure: OPEN REDUCTION INTERNAL FIXATION (ORIF) ANKLE FRACTURE;  Surgeon: Sanjuana Kava, MD;  Location: [facility];  Service: Orthopedics;  Laterality: Left;  . Cataract extraction w/phaco Right 06/15/2015    Procedure: CATARACT EXTRACTION PHACO AND INTRAOCULAR LENS PLACEMENT (IOC);  Surgeon: Tonny Branch, MD;  Location: [facility];  Service: Ophthalmology;  Laterality: Right;  CDE: 10.33  . Cataract extraction w/phaco Left 07/17/2015    Procedure: CATARACT EXTRACTION PHACO AND INTRAOCULAR LENS PLACEMENT (IOC);  Surgeon: Tonny Branch, MD;  Location: [facility];  Service: Ophthalmology;  Laterality: Left;  CDE:7.60    Social History   Social History  . Marital Status: Married    Spouse Name: N/A  . Number of Children: 2  . Years of Education: N/A   Occupational History  . disabled      Social History Main Topics  . Smoking status: Former Smoker -- 20 years  . Smokeless tobacco: Not on file  . Alcohol Use: No  . Drug Use: Yes    Special: Marijuana     Comment: yesterday  . Sexual Activity: Yes    Birth Control/ Protection: Surgical   Other Topics Concern  . Not on file   Social History Narrative    Current Outpatient Prescriptions on File Prior to Visit  Medication Sig Dispense Refill  . atorvastatin (LIPITOR) 40 MG tablet Take 1 tablet (40 mg total) by mouth daily. <PLEASE MAKE APPOINTMENT FOR REFILLS> 30 tablet 1  . Calcium-Vitamin D 600-125 MG-UNIT TABS Take 1 tablet by mouth daily.     . Cholecalciferol (VITAMIN D) 1000 UNITS capsule Take 1,000 Units by mouth 2 (two) times daily.     . Choline Fenofibrate (FENOFIBRIC ACID) 135 MG CPDR TAKE ONE CAPSULE BY MOUTH ONCE DAILY 30 capsule 4  . diltiazem (CARDIZEM CD) 240 MG 24 hr capsule Take 1 capsule (240 mg total) by mouth daily. <PLEASE MAKE APPOINTMENT FOR REFILLS> 90 capsule 0  . DULoxetine (CYMBALTA) 60 MG capsule TAKE ONE CAPSULE BY MOUTH EVERY DAY (Patient taking differently: Take 60 mg by mouth every morning. ) 90 capsule 1  .  fentaNYL (DURAGESIC - DOSED MCG/HR) 50 MCG/HR Place 1 patch (50 mcg total) onto the skin every other day. Change patch every 2 days 15 patch 0  . fluticasone (FLONASE) 50 MCG/ACT nasal spray USE 2 SPRAYS IN EACH       NOSTRIL DAILY (Patient taking differently: Place 2 sprays into both nostrils daily as needed for allergies or rhinitis. ) 48 g 1  . gabapentin (NEURONTIN) 800 MG tablet TAKE 1 TABLET TWICE A DAY (Patient taking differently: Take 800 mg by mouth 2 (two) times daily. ) 180 tablet 1  . levocetirizine (XYZAL) 5 MG tablet TAKE ONE TABLET BY MOUTH ONCE DAILY 90 tablet 0  . LORazepam (ATIVAN) 1 MG tablet TAKE ONE TABLET BY MOUTH ONCE DAILY WITH BREAKFAST 30 tablet 1  . metoprolol succinate (TOPROL-XL) 100 MG 24 hr tablet TAKE ONE TABLET BY MOUTH ONCE DAILY 30 tablet 4  .  Multiple Vitamins-Iron (QC DAILY MULTIVITAMINS/IRON PO) Take 1 tablet by mouth once a week.     . omega-3 acid ethyl esters (LOVAZA) 1 G capsule TAKE TWO CAPSULES BY MOUTH TWICE DAILY 180 capsule 2  . oxyCODONE-acetaminophen (PERCOCET) 10-325 MG tablet One tablet four times daily, as needed, for pain 120 tablet 0  . pantoprazole (PROTONIX) 40 MG tablet TAKE ONE TABLET BY MOUTH ONCE DAILY 30 tablet 3  . raloxifene (EVISTA) 60 MG tablet TAKE ONE TABLET BY MOUTH ONCE DAILY 30 tablet 4  . sennosides-docusate sodium (SENOKOT-S) 8.6-50 MG tablet Take 1 tablet by mouth at bedtime.      No current facility-administered medications on file prior to visit.    Allergies  Allergen Reactions  . Codeine     Pt states she does not have any problems with codeine.  . Nickel Rash    Family History  Problem Relation Age of Onset  . Rheum arthritis Mother   . Osteoporosis Mother   . Heart disease Father   . Diabetes Father   . Hyperlipidemia Father   . Cancer Father     bladder   . Arthritis Brother   . Heart disease Brother     BP 122/86 mmHg  Pulse 63  Temp(Src) 97.8 F (36.6 C) (Oral)  Wt 201 lb (91.173 kg)  SpO2 94%  Review of Systems She has cracking fingernails.      Objective:   Physical Exam VITAL SIGNS:  See vs page.  GENERAL: no distress.  NECK: There is no palpable thyroid enlargement.  No thyroid nodule is palpable.  No palpable lymphadenopathy at the anterior neck.     Lab Results  Component Value Date   TSH 4.619* 05/12/2015      Assessment & Plan:  Hypothyroidism: she needs increased rx. Small multinodular goiter: this is usually due to small hyperfunctioning adenomas.  However, in this case (hypothyroidism), chronic thyroiditis may account for the nodules.   Patient is advised the following: Patient Instructions  We can skip the ultrasound for now.   i have sent a prescription to your pharmacy, to increase the thyroid pill.  Please have it rechecked in Feb  with Dr Moshe Cipro.  Please return in 1 year.

## 2015-08-25 ENCOUNTER — Other Ambulatory Visit: Payer: Self-pay

## 2015-08-25 MED ORDER — FENTANYL 50 MCG/HR TD PT72: 50.0000 ug | MEDICATED_PATCH | TRANSDERMAL | Status: DC

## 2015-08-25 MED ORDER — OXYCODONE-ACETAMINOPHEN 10-325 MG PO TABS: ORAL_TABLET | ORAL | Status: DC

## 2015-08-27 ENCOUNTER — Other Ambulatory Visit: Payer: Self-pay | Admitting: Cardiovascular Disease

## 2015-08-27 ENCOUNTER — Other Ambulatory Visit: Payer: Self-pay | Admitting: Family Medicine

## 2015-08-28 NOTE — Telephone Encounter (Signed)
Rx request sent to pharmacy.  

## 2015-08-29 ENCOUNTER — Other Ambulatory Visit: Payer: Self-pay | Admitting: Family Medicine

## 2015-09-14 ENCOUNTER — Ambulatory Visit (INDEPENDENT_AMBULATORY_CARE_PROVIDER_SITE_OTHER): Payer: Medicare Other | Admitting: Cardiovascular Disease

## 2015-09-14 ENCOUNTER — Encounter: Payer: Self-pay | Admitting: Cardiovascular Disease

## 2015-09-14 VITALS — BP 120/76 | HR 57 | Ht 65.0 in | Wt 203.0 lb

## 2015-09-14 DIAGNOSIS — N184 Chronic kidney disease, stage 4 (severe): Secondary | ICD-10-CM

## 2015-09-14 DIAGNOSIS — E785 Hyperlipidemia, unspecified: Secondary | ICD-10-CM

## 2015-09-14 DIAGNOSIS — F418 Other specified anxiety disorders: Secondary | ICD-10-CM

## 2015-09-14 DIAGNOSIS — I10 Essential (primary) hypertension: Secondary | ICD-10-CM

## 2015-09-14 DIAGNOSIS — E669 Obesity, unspecified: Secondary | ICD-10-CM | POA: Insufficient documentation

## 2015-09-14 DIAGNOSIS — E038 Other specified hypothyroidism: Secondary | ICD-10-CM

## 2015-09-14 DIAGNOSIS — K219 Gastro-esophageal reflux disease without esophagitis: Secondary | ICD-10-CM

## 2015-09-14 DIAGNOSIS — F32A Depression, unspecified: Secondary | ICD-10-CM

## 2015-09-14 DIAGNOSIS — F329 Major depressive disorder, single episode, unspecified: Secondary | ICD-10-CM

## 2015-09-14 DIAGNOSIS — N183 Chronic kidney disease, stage 3 unspecified: Secondary | ICD-10-CM

## 2015-09-14 DIAGNOSIS — F419 Anxiety disorder, unspecified: Secondary | ICD-10-CM

## 2015-09-14 NOTE — Progress Notes (Signed)
Patient ID: ALAYZHA AN, female   DOB: May 18, 1951, 65 y.o.   MRN: [id number]      Primary MD:  Dr. Tula Nakayama  HPI: NAIDA ESCALANTE is a 65 y.o. female who presents to the office today for a 16 month follow up cardiology evaluation.  Ms Mcconathy has a history of palpitations which have been controlled with beta blocker therapy, hypertension, mixed hyperlipidemia, obesity, mild renal insufficiency, and chronic low back pain.  An echo Doppler study in 2010  showed mild aortic sclerosis without stenosis, mild mitral and calcification with mild MR, and mild TR, and normal systolic function.  A nuclear perfusion study showed normal perfusion.  She has a significant history of depression and takes Cymbalta, both for depression, as well as for chronic low back pain.  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  She walks with a cane.  Her low back pain has been debilitating.  Over the past year, she states that her palpitations have been controlled, but rarely  notes increased heart rate and takes an extra Toprol with benefit.   Last [place] she broke her ankle which had limited her activity.  She continues to have crying spells frequently when she is upset and recently has been having some family issues. She sees Dr. Loanne Drilling for endocrinologic evaluation.  She states that several weeks ago her thyroid medicine was increased.  She also had issues with her back and had been on a course of prednisone which led to some leg swelling.  She denies any chest tightness other than when she is emotional.  He denies presyncope or syncope.  She presents for evaluation.  Past Medical History  Diagnosis Date  . Allergic rhinitis   . Anxiety   . Depression   . GERD (gastroesophageal reflux disease)   . Hypertension   . Chronic back pain   . Chronic constipation   . Palpitations   . Angina at rest [facility])   .  Thyroid disease   . CKD (chronic kidney disease)   . Fibromyalgia   . Hypertension   . Hyperlipidemia   . Aortic sclerosis ([place])   . Mitral regurgitation   . Tricuspid regurgitation     Past Surgical History  Procedure Laterality Date  . Tonsillectomy  childhood   . Back surgery  2005  . Tubal ligation  1973  . US echocardiography  12/19/2008    mild mitral annular ca+,AOV mildly sclerotic,mild MR & TR  . Nm myocar perf wall motion  02/16/2009    normal  . Breast lumpectomy  1970    right   . Orif ankle fracture Left 10/26/2014    Procedure: OPEN REDUCTION INTERNAL FIXATION (ORIF) ANKLE FRACTURE;  Surgeon: Sanjuana Kava, MD;  Location: [facility];  Service: Orthopedics;  Laterality: Left;  . Cataract extraction w/phaco Right 06/15/2015    Procedure: CATARACT EXTRACTION PHACO AND INTRAOCULAR LENS PLACEMENT (IOC);  Surgeon: Tonny Branch, MD;  Location: [facility];  Service: Ophthalmology;  Laterality: Right;  CDE: 10.33  . Cataract extraction w/phaco Left 07/17/2015    Procedure: CATARACT EXTRACTION PHACO AND INTRAOCULAR LENS PLACEMENT (IOC);  Surgeon: Tonny Branch, MD;  Location: [facility];  Service: Ophthalmology;  Laterality: Left;  CDE:7.60    Allergies  Allergen Reactions  . Codeine     Pt states she does not have any problems with codeine.  . Nickel Rash  Current Outpatient Prescriptions  Medication Sig Dispense Refill  . atorvastatin (LIPITOR) 40 MG tablet Take 1 tablet (40 mg total) by mouth daily. <PLEASE MAKE APPOINTMENT FOR REFILLS> 30 tablet 1  . Calcium-Vitamin D 600-125 MG-UNIT TABS Take 1 tablet by mouth daily.     Marland Kitchen CARTIA XT 240 MG 24 hr capsule TAKE ONE CAPSULE BY MOUTH ONCE DAILY <PLEASE MAKE APPOINTMENT FOR REFILLS> 90 capsule 0  . Cholecalciferol (VITAMIN D) 1000 UNITS capsule Take 1,000 Units by mouth 2 (two) times daily.     . Choline Fenofibrate (FENOFIBRIC ACID) 135 MG CPDR TAKE ONE CAPSULE BY MOUTH ONCE DAILY 30 capsule 4  . diazepam (VALIUM) 5 MG tablet     .  DULoxetine (CYMBALTA) 60 MG capsule TAKE ONE CAPSULE BY MOUTH EVERY DAY (Patient taking differently: Take 60 mg by mouth every morning. ) 90 capsule 1  . fentaNYL (DURAGESIC - DOSED MCG/HR) 50 MCG/HR Place 1 patch (50 mcg total) onto the skin every other day. Change patch every 2 days 15 patch 0  . fluticasone (FLONASE) 50 MCG/ACT nasal spray USE 2 SPRAYS IN EACH       NOSTRIL DAILY (Patient taking differently: Place 2 sprays into both nostrils daily as needed for allergies or rhinitis. ) 48 g 1  . gabapentin (NEURONTIN) 800 MG tablet TAKE 1 TABLET TWICE A DAY (Patient taking differently: Take 800 mg by mouth 2 (two) times daily. ) 180 tablet 1  . levocetirizine (XYZAL) 5 MG tablet TAKE ONE TABLET BY MOUTH ONCE DAILY 90 tablet 0  . levothyroxine (SYNTHROID, LEVOTHROID) 75 MCG tablet Take 1 tablet (75 mcg total) by mouth daily before breakfast. 90 tablet 3  . LORazepam (ATIVAN) 1 MG tablet Take 1 mg by mouth at bedtime.    . metoprolol succinate (TOPROL-XL) 100 MG 24 hr tablet TAKE ONE TABLET BY MOUTH ONCE DAILY 30 tablet 4  . Multiple Vitamins-Iron (QC DAILY MULTIVITAMINS/IRON PO) Take 1 tablet by mouth once a week.     . omega-3 acid ethyl esters (LOVAZA) 1 G capsule TAKE TWO CAPSULES BY MOUTH TWICE DAILY 180 capsule 2  . omega-3 acid ethyl esters (LOVAZA) 1 g capsule TAKE TWO CAPSULES BY MOUTH TWICE DAILY 180 capsule 0  . omega-3 acid ethyl esters (LOVAZA) 1 g capsule TAKE TWO CAPSULES BY MOUTH TWICE DAILY 180 capsule 0  . oxyCODONE-acetaminophen (PERCOCET) 10-325 MG tablet One tablet four times daily, as needed, for pain 120 tablet 0  . pantoprazole (PROTONIX) 40 MG tablet TAKE ONE TABLET BY MOUTH ONCE DAILY 30 tablet 3  . raloxifene (EVISTA) 60 MG tablet TAKE ONE TABLET BY MOUTH ONCE DAILY 30 tablet 4  . sennosides-docusate sodium (SENOKOT-S) 8.6-50 MG tablet Take 1 tablet by mouth at bedtime.      No current facility-administered medications for this visit.    Social History   Social  History  . Marital Status: Married    Spouse Name: N/A  . Number of Children: 2  . Years of Education: N/A   Occupational History  . disabled     Social History Main Topics  . Smoking status: Former Smoker -- 20 years  . Smokeless tobacco: Not on file  . Alcohol Use: No  . Drug Use: Yes    Special: Marijuana     Comment: yesterday  . Sexual Activity: Yes    Birth Control/ Protection: Surgical   Other Topics Concern  . Not on file   Social History Narrative    Family History  Problem Relation Age of Onset  . Rheum arthritis Mother   . Osteoporosis Mother   . Heart disease Father   . Diabetes Father   . Hyperlipidemia Father   . Cancer Father     bladder   . Arthritis Brother   . Heart disease Brother     ROS General: Negative; No fevers, chills, or night sweats HEENT: Negative; No changes in vision or hearing, sinus congestion, difficulty swallowing Pulmonary: Negative; No cough, wheezing, shortness of breath, hemoptysis Cardiovascular: See HPI: No chest pain, presyncope, syncope, palpitations Positive for occasional ankle swelling GI: Positive for GERD. No nausea, vomiting, diarrhea, or abdominal pain GU: Negative; No dysuria, hematuria, or difficulty voiding Musculoskeletal: Positive for chronic low back pain and knee discomfort Hematologic: Negative; no easy bruising, bleeding Endocrine: Positive for hypothyroidism.  No diabetes Neuro: Negative; no changes in balance, headaches Skin: Negative; No rashes or skin lesions Psychiatric: Positive for depression and anxiety Sleep: Negative; No snoring,  daytime sleepiness, hypersomnolence, bruxism, restless legs, hypnogognic hallucinations. Other comprehensive 14 point system review is negative   Physical Exam BP 120/76 mmHg  Pulse 57  Ht _0  (1.651 m)  Wt 203 lb (92.08 kg)  BMI 33.78 kg/m2   Wt Readings from Last 3 Encounters:  09/14/15 203 lb (92.08 kg)  08/10/15 201 lb (91.173 kg)  06/09/15 200 lb  (90.719 kg)   General: Alert, oriented, no distress.  She was tearful when thinking about problems. Skin: normal turgor, no rashes, warm and dry HEENT: Normocephalic, atraumatic. Pupils equal round and reactive to light; sclera anicteric; extraocular muscles intact, No lid lag; Nose without nasal septal hypertrophy; Mouth/Parynx benign; Mallinpatti scale 3 Neck: No JVD, no carotid bruits; normal carotid upstroke Lungs: clear to ausculatation and percussion bilaterally; no wheezing or rales, normal inspiratory and expiratory effort Chest wall: without tenderness to palpitation Heart: PMI not displaced, RRR, s1 s2 normal, 4-1/3 semsystolic murmur, No diastolic murmur, no rubs, gallops, thrills, or heaves Abdomen:  Central adiposity;soft, nontender; no hepatosplenomehaly, BS+; abdominal aorta nontender and not dilated by palpation. Back: no CVA tenderness Pulses: 2+  Musculoskeletal: full range of motion, normal strength, no joint deformities Extremities: Pulses 2+, no clubbing cyanosis or edema, Homan's sign negative  Neurologic: grossly nonfocal; Cranial nerves grossly wnl Psychologic: Tearful and had several crying spells illicted by family stress with children  ECG (independently read by me):  Sinus bradycardia 57 bpm.  Incomplete right bundle branch block.  September 2015ECG (independently read by me): Normal sinus rhythm with incomplete right bundle branch block.  LABS:  BMP Latest Ref Rng 06/09/2015 05/12/2015 01/09/2015  Glucose 65 - 99 mg/dL 91 75 85  BUN 6 - 20 mg/dL 21(H) 21 18  Creatinine 0.44 - 1.00 mg/dL 1.45(H) 1.44(H) 1.47(H)  Sodium 135 - 145 mmol/L 142 142 141  Potassium 3.5 - 5.1 mmol/L 3.8 4.3 4.1  Chloride 101 - 111 mmol/L 106 104 108  CO2 22 - 32 mmol/L _1 Calcium 8.9 - 10.3 mg/dL 9.6 9.6 9.4   Hepatic Function Latest Ref Rng 05/12/2015 07/07/2014 12/25/2012  Total Protein 6.1 - 8.1 g/dL 6.4 6.8 6.9  Albumin 3.6 - 5.1 g/dL 4.1 4.2 4.2  AST 10 - 35 U/L _2 ALT 6 - 29 U/L 9 <8 11  Alk Phosphatase 33 - 130 U/L 42 53 45  Total Bilirubin 0.2 - 1.2 mg/dL 0.5 0.6 0.6  Bilirubin, Direct 0.0 - 0.3 mg/dL - - -   CBC Latest  Ref Rng 06/09/2015 01/09/2015 10/28/2014  WBC 4.0 - 10.5 K/uL 9.8 12.4(H) 6.7  Hemoglobin 12.0 - 15.0 g/dL 13.3 12.2 9.8(L)  Hematocrit 36.0 - 46.0 % 39.4 35.8(L) 29.2(L)  Platelets 150 - 400 K/uL 292 346 251   Lab Results  Component Value Date   MCV 96.3 06/09/2015   MCV 91.1 01/09/2015   MCV 94.2 10/28/2014   Lab Results  Component Value Date   TSH 4.619* 05/12/2015   Lab Results  Component Value Date   HGBA1C 4.8 07/07/2014   Lipid Panel     Component Value Date/Time   CHOL 141 05/12/2015 1459   TRIG 152* 05/12/2015 1459   HDL 47 05/12/2015 1459   CHOLHDL 3.0 05/12/2015 1459   VLDL 30 05/12/2015 1459   LDLCALC 64 05/12/2015 1459    RADIOLOGY: No results found.    ASSESSMENT AND PLAN: Ms. Azalya Galyon is a 65 year old female who has a history of depression  labile with episodic crying spells.  has been taking Cymbalta 60 mg in addition to lorazepam.  She has a history of hypertension and bloodpressure today was controlled on long-acting diltiazem 240 mg, and Toprol-XL 100 mg. Her blood pressure today was 150/74 when rechecked by me.   Palpitations have improved on this current regimen.  She has a history of hypothyroidism and recently her Synthroid dose was increased to 75 g daily.  She tells me that Dr. Loanne Drilling has requested that follow-up TSH blood level be obtained.  She has hyperlipidemia on combination therapy consisting of atorvastatin 40 mg, fenofibrate in addition to omega-3 fatty acids. She denies myalgias.  Her GERD is controlled with Protonix.  She has stage III chronic kidney disease with most recent creatinine at 1.45. Her body mass index is indicative of obesity at 33.78.  Weight loss and exercise was recommended. I am checking laboratory to be done in the fasting state.  On her current medical  regimen and dose adjustments to her medications will be done if necessary.  As long as she remains stable, I will see her in one year for reevaluation.   Time spent: 25 minutes  Troy Sine, MD, [facility]  09/14/2015 7:33 PM

## 2015-09-14 NOTE — Patient Instructions (Signed)
Your physician recommends that you return for lab work fasting.   Your physician wants you to follow-up in: 1 year or sooner if needed. You will receive a reminder letter in the mail two months in advance. If you don't receive a letter, please call our office to schedule the follow-up appointment.  If you need a refill on your cardiac medications before your next appointment, please call your pharmacy.    

## 2015-09-19 ENCOUNTER — Telehealth: Payer: Self-pay | Admitting: Family Medicine

## 2015-09-19 ENCOUNTER — Encounter: Payer: Medicare Other | Admitting: Family Medicine

## 2015-09-19 NOTE — Telephone Encounter (Signed)
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  Her current pain regime , was initiated by Dr Nelva Bush , a pain specialist, and this has enabled her to maintain a fair quality of life despite severe chronic pain.  Your review of this and favorable consideration are greatly appreciated  I will sign Thanks Letter with fax # to send to is in your area

## 2015-09-19 NOTE — Telephone Encounter (Signed)
Letter typed, ready to be faxed

## 2015-09-26 ENCOUNTER — Other Ambulatory Visit: Payer: Self-pay | Admitting: Family Medicine

## 2015-09-27 ENCOUNTER — Other Ambulatory Visit: Payer: Self-pay

## 2015-09-27 MED ORDER — OXYCODONE-ACETAMINOPHEN 10-325 MG PO TABS: ORAL_TABLET | ORAL | Status: DC

## 2015-09-27 MED ORDER — FENTANYL 50 MCG/HR TD PT72: 50.0000 ug | MEDICATED_PATCH | TRANSDERMAL | Status: DC

## 2015-09-28 ENCOUNTER — Other Ambulatory Visit: Payer: Self-pay | Admitting: Cardiovascular Disease

## 2015-09-28 DIAGNOSIS — E038 Other specified hypothyroidism: Secondary | ICD-10-CM | POA: Diagnosis not present

## 2015-09-28 DIAGNOSIS — Z79899 Other long term (current) drug therapy: Secondary | ICD-10-CM | POA: Diagnosis not present

## 2015-09-28 DIAGNOSIS — R809 Proteinuria, unspecified: Secondary | ICD-10-CM | POA: Diagnosis not present

## 2015-09-28 DIAGNOSIS — D509 Iron deficiency anemia, unspecified: Secondary | ICD-10-CM | POA: Diagnosis not present

## 2015-09-28 DIAGNOSIS — I10 Essential (primary) hypertension: Secondary | ICD-10-CM | POA: Diagnosis not present

## 2015-09-28 DIAGNOSIS — E559 Vitamin D deficiency, unspecified: Secondary | ICD-10-CM | POA: Diagnosis not present

## 2015-09-28 DIAGNOSIS — N183 Chronic kidney disease, stage 3 (moderate): Secondary | ICD-10-CM | POA: Diagnosis not present

## 2015-09-28 DIAGNOSIS — Z1159 Encounter for screening for other viral diseases: Secondary | ICD-10-CM | POA: Diagnosis not present

## 2015-09-28 DIAGNOSIS — E785 Hyperlipidemia, unspecified: Secondary | ICD-10-CM | POA: Diagnosis not present

## 2015-09-28 DIAGNOSIS — N184 Chronic kidney disease, stage 4 (severe): Secondary | ICD-10-CM | POA: Diagnosis not present

## 2015-09-29 LAB — COMPREHENSIVE METABOLIC PANEL
ALBUMIN: 3.7 g/dL (ref 3.6–4.8)
ALK PHOS: 46 IU/L (ref 39–117)
ALT: 6 IU/L (ref 0–32)
AST: 20 IU/L (ref 0–40)
Albumin/Globulin Ratio: 1.5 (ref 1.1–2.5)
BUN / CREAT RATIO: 13 (ref 11–26)
BUN: 19 mg/dL (ref 8–27)
Bilirubin Total: 0.3 mg/dL (ref 0.0–1.2)
CHLORIDE: 105 mmol/L (ref 96–106)
CO2: 27 mmol/L (ref 18–29)
CREATININE: 1.47 mg/dL — AB (ref 0.57–1.00)
Calcium: 9.7 mg/dL (ref 8.7–10.3)
GFR calc non Af Amer: 37 mL/min/{1.73_m2} — ABNORMAL LOW (ref >59)
GFR, EST AFRICAN AMERICAN: 43 mL/min/{1.73_m2} — AB (ref >59)
GLOBULIN, TOTAL: 2.4 g/dL (ref 1.5–4.5)
Glucose: 96 mg/dL (ref 65–99)
POTASSIUM: 4.6 mmol/L (ref 3.5–5.2)
Sodium: 146 mmol/L — ABNORMAL HIGH (ref 134–144)
TOTAL PROTEIN: 6.1 g/dL (ref 6.0–8.5)

## 2015-09-29 LAB — CBC
HEMOGLOBIN: 12.2 g/dL (ref 11.1–15.9)
Hematocrit: 36.7 % (ref 34.0–46.6)
MCH: 30.7 pg (ref 26.6–33.0)
MCHC: 33.2 g/dL (ref 31.5–35.7)
MCV: 92 fL (ref 79–97)
Platelets: 417 10*3/uL — ABNORMAL HIGH (ref 150–379)
RBC: 3.97 x10E6/uL (ref 3.77–5.28)
RDW: 13.9 % (ref 12.3–15.4)
WBC: 8.2 10*3/uL (ref 3.4–10.8)

## 2015-09-29 LAB — LIPID PANEL W/O CHOL/HDL RATIO
CHOLESTEROL TOTAL: 122 mg/dL (ref 100–199)
HDL: 33 mg/dL — AB (ref >39)
LDL CALC: 57 mg/dL (ref 0–99)
TRIGLYCERIDES: 158 mg/dL — AB (ref 0–149)
VLDL Cholesterol Cal: 32 mg/dL (ref 5–40)

## 2015-09-29 LAB — TSH: TSH: 0.628 u[IU]/mL (ref 0.450–4.500)

## 2015-10-04 DIAGNOSIS — E559 Vitamin D deficiency, unspecified: Secondary | ICD-10-CM | POA: Diagnosis not present

## 2015-10-04 DIAGNOSIS — N183 Chronic kidney disease, stage 3 (moderate): Secondary | ICD-10-CM | POA: Diagnosis not present

## 2015-10-04 DIAGNOSIS — I10 Essential (primary) hypertension: Secondary | ICD-10-CM | POA: Diagnosis not present

## 2015-10-04 DIAGNOSIS — D649 Anemia, unspecified: Secondary | ICD-10-CM | POA: Diagnosis not present

## 2015-10-06 ENCOUNTER — Encounter: Payer: Self-pay | Admitting: *Deleted

## 2015-10-10 ENCOUNTER — Encounter: Payer: Self-pay | Admitting: Family Medicine

## 2015-10-16 ENCOUNTER — Ambulatory Visit (INDEPENDENT_AMBULATORY_CARE_PROVIDER_SITE_OTHER): Payer: Medicare Other | Admitting: Family Medicine

## 2015-10-16 ENCOUNTER — Encounter: Payer: Self-pay | Admitting: Family Medicine

## 2015-10-16 VITALS — BP 114/70 | HR 73 | Resp 16 | Ht 65.0 in | Wt 207.0 lb

## 2015-10-16 DIAGNOSIS — Z0000 Encounter for general adult medical examination without abnormal findings: Secondary | ICD-10-CM

## 2015-10-16 DIAGNOSIS — M5442 Lumbago with sciatica, left side: Secondary | ICD-10-CM | POA: Diagnosis not present

## 2015-10-16 DIAGNOSIS — Z1211 Encounter for screening for malignant neoplasm of colon: Secondary | ICD-10-CM | POA: Diagnosis not present

## 2015-10-16 DIAGNOSIS — M62830 Muscle spasm of back: Secondary | ICD-10-CM | POA: Diagnosis not present

## 2015-10-16 DIAGNOSIS — M5441 Lumbago with sciatica, right side: Secondary | ICD-10-CM | POA: Diagnosis not present

## 2015-10-16 MED ORDER — KETOROLAC TROMETHAMINE 60 MG/2ML IM SOLN
60.0000 mg | Freq: Once | INTRAMUSCULAR | Status: AC
  Administered 2015-10-16: 60 mg via INTRAMUSCULAR

## 2015-10-16 MED ORDER — LORAZEPAM 1 MG PO TABS: ORAL_TABLET | ORAL | Status: DC

## 2015-10-16 MED ORDER — PREDNISONE 5 MG (21) PO TBPK: 5.0000 mg | ORAL_TABLET | ORAL | Status: DC

## 2015-10-16 MED ORDER — PREDNISONE 2.5 MG PO TABS: 2.5000 mg | ORAL_TABLET | Freq: Every day | ORAL | Status: DC

## 2015-10-16 MED ORDER — METHYLPREDNISOLONE ACETATE 80 MG/ML IJ SUSP
80.0000 mg | Freq: Once | INTRAMUSCULAR | Status: AC
  Administered 2015-10-16: 80 mg via INTRAMUSCULAR

## 2015-10-16 NOTE — Assessment & Plan Note (Signed)
C/o uncontrolled back spasm, increase ativan to 3 times daily dosing , total of 2 tabs daily , up from one daily

## 2015-10-16 NOTE — Progress Notes (Signed)
Subjective:    Patient ID: Regina Horton, female    DOB: 06/04/1951, 65 y.o.   MRN: [id number]  HPI Preventive Screening-Counseling & Management   Patient present here today for a Medicare annual wellness visit.   Current Problems (verified)   Medications Prior to Visit Allergies (verified)   PAST HISTORY  Family History (verified)   Social History  Married since 1969, 2 children, disabled in 2005 when she had back surgery. Former smoker    Risk Factors  Current exercise habits:  Not able to exercise due to back pain/knee pain   Dietary issues discussed: heart healthy diet encouraged, less carbs and fried fatty foods    Cardiac risk factors: none significant  Depression Screen  (Note: if answer to either of the following is "Yes", a more complete depression screening is indicated)   Over the past two weeks, have you felt down, depressed or hopeless? No  Over the past two weeks, have you felt little interest or pleasure in doing things? No  Have you lost interest or pleasure in daily life? No  Do you often feel hopeless? No  Do you cry easily over simple problems? No   Activities of Daily Living  In your present state of health, do you have any difficulty performing the following activities?  Driving?: doesn't drive- husband transports  Managing money?: No Feeding yourself?:No Getting from bed to chair?:No Climbing a flight of stairs?: has stairs in her house. Takes her time  Preparing food and eating?:No Bathing or showering?:No Getting dressed?:No Getting to the toilet?:No Using the toilet?:No Moving around from place to place?: No, uses cane to get around and a walker occasionally   Fall Risk Assessment In the past year have you fallen or had a near fall?:once last year when she broke her ankle  Are you currently taking any medications that make you dizzy?:No   Hearing Difficulties: No Do you often ask people to speak up or repeat themselves?:No Do you  experience ringing or noises in your ears?:No Do you have difficulty understanding soft or whispered voices?:No  Cognitive Testing  Alert? Yes Normal Appearance?Yes  Oriented to person? Yes Place? Yes  Time? Yes  Displays appropriate judgment?Yes  Can read the correct time from a watch face? yes Are you having problems remembering things?No  Advanced Directives have been discussed with the patient?Yes, no living will. Will give brochure  , full code   List the Names of Other Physician/Practitioners you currently use:  befekado (kidney) Claiborne Billings (cardio)  Loanne Drilling (endo)  Norris (ortho)  Glo Herring (gyn)  Indicate any recent Medical Services you may have received from other than [facility] providers in the past year (date may be approximate).   Assessment:    Annual Wellness Exam   Plan:      Medicare Attestation  I have personally reviewed:  The patient's medical and social history  Their use of alcohol, tobacco or illicit drugs  Their current medications and supplements  The patient's functional ability including ADLs,fall risks, home safety risks, cognitive, and hearing and visual impairment  Diet and physical activities  Evidence for depression or mood disorders  The patient's weight, height, BMI, and visual acuity have been recorded in the chart. I have made referrals, counseling, and provided education to the patient based on review of the above and I have provided the patient with a written personalized care plan for preventive services.      Review of Systems     Objective:  Physical Exam BP 114/70 mmHg  Pulse 73  Resp 16  Ht 5\' 5"  (1.651 m)  Wt 207 lb (93.895 kg)  BMI 34.45 kg/m2  SpO2 94%  MS: decreased ROM lumbar spine, relies on an assistive device for safe ambulation       Assessment & Plan:  Bilateral low back pain with sciatica Toradol 60 mg  and Depo medrol 80 mg im in office followed by 5 mg dose pack, also to start daily prednisone 2.5 mg  Spasm  of back muscles C/o uncontrolled back spasm, increase ativan to 3 times daily dosing , total of 2 tabs daily , up from one daily  Medicare annual wellness visit, subsequent Annual exam as documented. Counseling done  re healthy lifestyle involving commitment to 150 minutes exercise per week, heart healthy diet, and attaining healthy weight.The importance of adequate sleep also discussed. Regular seat belt use and home safety, is also discussed. Changes in health habits are decided on by the patient with goals and time frames  set for achieving them. Immunization and cancer screening needs are specifically addressed at this visit.

## 2015-10-16 NOTE — Assessment & Plan Note (Signed)

## 2015-10-16 NOTE — Assessment & Plan Note (Signed)
Toradol 60 mg  and Depo medrol 80 mg im in office followed by 5 mg dose pack, also to start daily prednisone 2.5 mg

## 2015-10-16 NOTE — Patient Instructions (Addendum)
F/u in 3.5  month, call if yyou need me before  Injections today for back and generalized pain and 6 day course of prednisone, after that take prednisone 2.5 mg one every day  New for spasm is Half ativan twice daily, (8 hrs apart) and continue one at bedtime If it makes you too sleepy cut back the dose please   Thankful you can see!!!  Labs are good  Thanks for choosing [facility], we consider it a privelige to serve you.  You are referred for colonoscopy, now that you are able to go!

## 2015-10-18 ENCOUNTER — Encounter (INDEPENDENT_AMBULATORY_CARE_PROVIDER_SITE_OTHER): Payer: Self-pay | Admitting: *Deleted

## 2015-10-21 ENCOUNTER — Other Ambulatory Visit: Payer: Self-pay | Admitting: Cardiovascular Disease

## 2015-10-23 ENCOUNTER — Ambulatory Visit: Payer: Medicare Other | Admitting: Cardiovascular Disease

## 2015-11-02 ENCOUNTER — Other Ambulatory Visit: Payer: Self-pay

## 2015-11-02 MED ORDER — OXYCODONE-ACETAMINOPHEN 10-325 MG PO TABS
ORAL_TABLET | ORAL | Status: DC
Start: 2015-11-02 — End: 2015-11-30

## 2015-11-02 MED ORDER — FENTANYL 50 MCG/HR TD PT72
50.0000 ug | MEDICATED_PATCH | TRANSDERMAL | Status: DC
Start: 2015-11-02 — End: 2015-11-30

## 2015-11-21 ENCOUNTER — Other Ambulatory Visit: Payer: Self-pay | Admitting: Family Medicine

## 2015-11-30 ENCOUNTER — Other Ambulatory Visit: Payer: Self-pay

## 2015-11-30 MED ORDER — OXYCODONE-ACETAMINOPHEN 10-325 MG PO TABS: ORAL_TABLET | ORAL | Status: DC

## 2015-11-30 MED ORDER — FENTANYL 50 MCG/HR TD PT72: 50.0000 ug | MEDICATED_PATCH | TRANSDERMAL | Status: DC

## 2015-12-29 ENCOUNTER — Other Ambulatory Visit: Payer: Self-pay

## 2015-12-29 MED ORDER — OXYCODONE-ACETAMINOPHEN 10-325 MG PO TABS: ORAL_TABLET | ORAL | Status: DC

## 2015-12-29 MED ORDER — FENTANYL 50 MCG/HR TD PT72: 50.0000 ug | MEDICATED_PATCH | TRANSDERMAL | Status: DC

## 2016-01-09 ENCOUNTER — Ambulatory Visit (INDEPENDENT_AMBULATORY_CARE_PROVIDER_SITE_OTHER): Payer: Medicare Other | Admitting: Family Medicine

## 2016-01-09 ENCOUNTER — Encounter: Payer: Self-pay | Admitting: Family Medicine

## 2016-01-09 VITALS — BP 132/76 | HR 86 | Resp 18 | Ht 66.0 in | Wt 202.0 lb

## 2016-01-09 DIAGNOSIS — M5441 Lumbago with sciatica, right side: Secondary | ICD-10-CM | POA: Diagnosis not present

## 2016-01-09 DIAGNOSIS — I10 Essential (primary) hypertension: Secondary | ICD-10-CM

## 2016-01-09 DIAGNOSIS — E669 Obesity, unspecified: Secondary | ICD-10-CM

## 2016-01-09 DIAGNOSIS — E785 Hyperlipidemia, unspecified: Secondary | ICD-10-CM

## 2016-01-09 DIAGNOSIS — M25561 Pain in right knee: Secondary | ICD-10-CM | POA: Diagnosis not present

## 2016-01-09 DIAGNOSIS — M5442 Lumbago with sciatica, left side: Secondary | ICD-10-CM | POA: Diagnosis not present

## 2016-01-09 DIAGNOSIS — F32A Depression, unspecified: Secondary | ICD-10-CM

## 2016-01-09 DIAGNOSIS — E038 Other specified hypothyroidism: Secondary | ICD-10-CM | POA: Diagnosis not present

## 2016-01-09 DIAGNOSIS — F419 Anxiety disorder, unspecified: Secondary | ICD-10-CM

## 2016-01-09 DIAGNOSIS — M25562 Pain in left knee: Secondary | ICD-10-CM

## 2016-01-09 DIAGNOSIS — F329 Major depressive disorder, single episode, unspecified: Secondary | ICD-10-CM

## 2016-01-09 DIAGNOSIS — F418 Other specified anxiety disorders: Secondary | ICD-10-CM

## 2016-01-09 MED ORDER — DULOXETINE HCL 60 MG PO CPEP: 60.0000 mg | ORAL_CAPSULE | Freq: Two times a day (BID) | ORAL | Status: DC

## 2016-01-09 MED ORDER — KETOROLAC TROMETHAMINE 60 MG/2ML IM SOLN
60.0000 mg | Freq: Once | INTRAMUSCULAR | Status: AC
  Administered 2016-01-09: 60 mg via INTRAMUSCULAR

## 2016-01-09 MED ORDER — METHYLPREDNISOLONE ACETATE 80 MG/ML IJ SUSP
80.0000 mg | Freq: Once | INTRAMUSCULAR | Status: AC
  Administered 2016-01-09: 80 mg via INTRAMUSCULAR

## 2016-01-09 NOTE — Patient Instructions (Signed)
F/U in 4 months, call if you need me before  Injections today, toradol and depo medrol for pain, and increase in cymbalta dose to twice daily  Take prednisone 5 mg one twice daily for 5 days start today  Call if unable to afford higher dose of cymbalta so script will be changed back to once daily  You are referred to Dr Nelva Bush and Veverly Fells as discussed

## 2016-01-09 NOTE — Assessment & Plan Note (Signed)
Bilateral knee pain, right worse than left, will refer to Dr Veverly Fells who has injected in the past with success

## 2016-01-09 NOTE — Progress Notes (Signed)
Subjective:    Patient ID: Regina Horton, female    DOB: 16-Feb-1951, 65 y.o.   MRN: [id number]  HPI   Regina Horton     MRN: [id number]      DOB: [birth year]   HPI [name] is here for follow up and re-evaluation of chronic medical conditions, medication management and review of any available recent lab and radiology data.  Preventive health is updated, specifically  Cancer screening and Immunization. Needs both colonoscopy and mammogram Questions or concerns regarding consultations or procedures which the PT has had in the interim are  addressed. The PT denies any adverse reactions to current medications since the last visit.  C/o increased and uncontrolled back and knee pain C/o increased personal stress as her gran daughter recently attempted suicide, and she is unable to see her   ROS Denies recent fever or chills. Denies sinus pressure, nasal congestion, ear pain or sore throat. Denies chest congestion, productive cough or wheezing. Denies chest pains, palpitations and leg swelling Denies abdominal pain, nausea, vomiting,diarrhea or constipation.   Denies dysuria, frequency, hesitancy or incontinence. C/o increased and uncontrolled  joint pain,  and limitation in mobility. Denies headaches, seizures, numbness, or tingling. Denies uncontrolled  depression, does have increased anxiety and some  insomnia. Denies skin break down or rash.   PE  BP 132/76 mmHg  Pulse 86  Resp 18  Ht 5\' 6"  (1.676 m)  Wt 202 lb (91.627 kg)  BMI 32.62 kg/m2  SpO2 96%  Pt in pain  Patient alert and oriented and in no cardiopulmonary distress.  HEENT: No facial asymmetry, EOMI,   oropharynx pink and moist.  Neck supple no JVD, no mass.  Chest: Clear to auscultation bilaterally.  CVS: S1, S2 no murmurs, no S3.Regular rate.  ABD: Soft non tender.   Ext: No edema  MS: Decreased  ROM spine,  and knees.  Skin: Intact, no ulcerations or rash noted.  Psych: Good eye contact, normal  affect. Memory intact anxious and tearful at times, not depressed appearing. CNS: CN 2-12 intact, power,  normal throughout.no focal deficits noted.   Assessment & Plan   Knee pain, bilateral Bilateral knee pain, right worse than left, will refer to Dr Veverly Fells who has injected in the past with success  Bilateral low back pain with sciatica Uncontrolled.Toradol and depo medrol administered IM in the office , to be followed by a short course of oral prednisone and NSAIDS. Increase in cymbalta dose Refer to pain clinic, may benefit from epidural  Essential hypertension Controlled, no change in medication DASH diet and commitment to daily physical activity for a minimum of 30 minutes discussed and encouraged, as a part of hypertension management. The importance of attaining a healthy weight is also discussed.  BP/Weight 01/09/2016 10/16/2015 09/14/2015 08/10/2015 07/17/2015 06/15/2015 [id number]  Systolic BP Q000111Q [id number] 0000000 Q000111Q  Diastolic BP 76 70 76 86 51 62 66  Wt. (Lbs) 202 207 203 201 - - 200  BMI 32.62 34.45 33.78 33.45 - - 33.28        Hypothyroidism Followed by endo, stable  Anxiety and depression Not suicidal or homicidal, overwhelmed by chronic pain and family illnesses. Would benefit from therapy but her lack of mobility contributes to unwillingness to participate Increase in cymbalta dose may also be benficial  Hyperlipidemia LDL goal <100 Hyperlipidemia:Low fat diet discussed and encouraged.   Lipid Panel  Lab Results  Component Value Date   CHOL 122 09/28/2015  HDL 33* 09/28/2015   LDLCALC 57 09/28/2015   TRIG 158* 09/28/2015   CHOLHDL 3.0 05/12/2015  Updated lab needed at/ before next visit. Needs to reduce fat intake      Obesity (BMI 30-39.9) Improved. Pt applauded on succesful weight loss through lifestyle change, and encouraged to continue same. Weight loss goal set for the next several months.       Review of Systems       Objective:   Physical Exam        Assessment & Plan:

## 2016-01-14 NOTE — Assessment & Plan Note (Signed)
Not suicidal or homicidal, overwhelmed by chronic pain and family illnesses. Would benefit from therapy but her lack of mobility contributes to unwillingness to participate Increase in cymbalta dose may also be benficial

## 2016-01-14 NOTE — Assessment & Plan Note (Signed)
Controlled, no change in medication DASH diet and commitment to daily physical activity for a minimum of 30 minutes discussed and encouraged, as a part of hypertension management. The importance of attaining a healthy weight is also discussed.  BP/Weight 01/09/2016 10/16/2015 09/14/2015 08/10/2015 07/17/2015 06/15/2015 [id number]  Systolic BP Q000111Q [id number] [id number] [id number] 0000000 0000000 Q000111Q  Diastolic BP 76 70 76 86 51 62 66  Wt. (Lbs) 202 207 203 201 - - 200  BMI 32.62 34.45 33.78 33.45 - - 33.28

## 2016-01-14 NOTE — Assessment & Plan Note (Signed)
Hyperlipidemia:Low fat diet discussed and encouraged.   Lipid Panel  Lab Results  Component Value Date   CHOL 122 09/28/2015   HDL 33* 09/28/2015   LDLCALC 57 09/28/2015   TRIG 158* 09/28/2015   CHOLHDL 3.0 05/12/2015  Updated lab needed at/ before next visit. Needs to reduce fat intake

## 2016-01-14 NOTE — Assessment & Plan Note (Signed)
Uncontrolled.Toradol and depo medrol administered IM in the office , to be followed by a short course of oral prednisone and NSAIDS. Increase in cymbalta dose Refer to pain clinic, may benefit from epidural

## 2016-01-14 NOTE — Assessment & Plan Note (Signed)
Followed by endo, stable

## 2016-01-14 NOTE — Assessment & Plan Note (Signed)
Improved. Pt applauded on succesful weight loss through lifestyle change, and encouraged to continue same. Weight loss goal set for the next several months.  

## 2016-01-26 ENCOUNTER — Other Ambulatory Visit: Payer: Self-pay

## 2016-01-26 MED ORDER — FENTANYL 50 MCG/HR TD PT72: 50.0000 ug | MEDICATED_PATCH | TRANSDERMAL | Status: DC

## 2016-01-26 MED ORDER — OXYCODONE-ACETAMINOPHEN 10-325 MG PO TABS: ORAL_TABLET | ORAL | Status: DC

## 2016-01-29 ENCOUNTER — Telehealth: Payer: Self-pay | Admitting: Family Medicine

## 2016-01-29 ENCOUNTER — Other Ambulatory Visit: Payer: Self-pay | Admitting: Family Medicine

## 2016-01-29 DIAGNOSIS — M170 Bilateral primary osteoarthritis of knee: Secondary | ICD-10-CM | POA: Diagnosis not present

## 2016-01-29 DIAGNOSIS — M1712 Unilateral primary osteoarthritis, left knee: Secondary | ICD-10-CM | POA: Diagnosis not present

## 2016-01-29 DIAGNOSIS — M1711 Unilateral primary osteoarthritis, right knee: Secondary | ICD-10-CM | POA: Diagnosis not present

## 2016-01-29 NOTE — Telephone Encounter (Signed)
Pt seeing Dr Nelva Bush on 6/15 for pain management so I am cancelling current meds in case he does give her a script.

## 2016-01-30 NOTE — Telephone Encounter (Signed)
Noted  

## 2016-01-30 NOTE — Telephone Encounter (Signed)
noted 

## 2016-02-09 ENCOUNTER — Other Ambulatory Visit: Payer: Self-pay

## 2016-02-09 ENCOUNTER — Telehealth: Payer: Self-pay

## 2016-02-09 DIAGNOSIS — M5442 Lumbago with sciatica, left side: Principal | ICD-10-CM

## 2016-02-09 DIAGNOSIS — M5441 Lumbago with sciatica, right side: Secondary | ICD-10-CM

## 2016-02-09 MED ORDER — DULOXETINE HCL 60 MG PO CPEP: 60.0000 mg | ORAL_CAPSULE | Freq: Two times a day (BID) | ORAL | Status: DC

## 2016-02-09 MED ORDER — RALOXIFENE HCL 60 MG PO TABS: 60.0000 mg | ORAL_TABLET | Freq: Every day | ORAL | Status: DC

## 2016-02-09 NOTE — Telephone Encounter (Signed)
Dr Nelva Bush rescheduled to July 12

## 2016-02-09 NOTE — Telephone Encounter (Signed)
Patient rescheduled appt with Dr Nelva Bush so 1 further month of pain meds will be given but advised to reschedule appt asap

## 2016-02-16 ENCOUNTER — Other Ambulatory Visit: Payer: Self-pay | Admitting: Family Medicine

## 2016-02-28 DIAGNOSIS — M4316 Spondylolisthesis, lumbar region: Secondary | ICD-10-CM | POA: Diagnosis not present

## 2016-02-28 DIAGNOSIS — G894 Chronic pain syndrome: Secondary | ICD-10-CM | POA: Diagnosis not present

## 2016-02-28 DIAGNOSIS — Z79891 Long term (current) use of opiate analgesic: Secondary | ICD-10-CM | POA: Diagnosis not present

## 2016-02-28 DIAGNOSIS — M961 Postlaminectomy syndrome, not elsewhere classified: Secondary | ICD-10-CM | POA: Diagnosis not present

## 2016-03-27 DIAGNOSIS — M5416 Radiculopathy, lumbar region: Secondary | ICD-10-CM | POA: Diagnosis not present

## 2016-03-31 ENCOUNTER — Other Ambulatory Visit: Payer: Self-pay | Admitting: Family Medicine

## 2016-04-11 DIAGNOSIS — R809 Proteinuria, unspecified: Secondary | ICD-10-CM | POA: Diagnosis not present

## 2016-04-11 DIAGNOSIS — I10 Essential (primary) hypertension: Secondary | ICD-10-CM | POA: Diagnosis not present

## 2016-04-11 DIAGNOSIS — N183 Chronic kidney disease, stage 3 (moderate): Secondary | ICD-10-CM | POA: Diagnosis not present

## 2016-04-11 DIAGNOSIS — D509 Iron deficiency anemia, unspecified: Secondary | ICD-10-CM | POA: Diagnosis not present

## 2016-04-11 DIAGNOSIS — E559 Vitamin D deficiency, unspecified: Secondary | ICD-10-CM | POA: Diagnosis not present

## 2016-04-11 DIAGNOSIS — Z79899 Other long term (current) drug therapy: Secondary | ICD-10-CM | POA: Diagnosis not present

## 2016-05-01 DIAGNOSIS — N183 Chronic kidney disease, stage 3 (moderate): Secondary | ICD-10-CM | POA: Diagnosis not present

## 2016-05-01 DIAGNOSIS — I10 Essential (primary) hypertension: Secondary | ICD-10-CM | POA: Diagnosis not present

## 2016-05-01 DIAGNOSIS — N250 Renal osteodystrophy: Secondary | ICD-10-CM | POA: Diagnosis not present

## 2016-05-01 DIAGNOSIS — I509 Heart failure, unspecified: Secondary | ICD-10-CM | POA: Diagnosis not present

## 2016-05-02 ENCOUNTER — Other Ambulatory Visit: Payer: Self-pay | Admitting: Family Medicine

## 2016-05-06 ENCOUNTER — Telehealth: Payer: Self-pay | Admitting: Family Medicine

## 2016-05-06 NOTE — Telephone Encounter (Signed)
LM to reschedule - Dr. Moshe Cipro PAL

## 2016-05-14 ENCOUNTER — Ambulatory Visit: Payer: Medicare Other | Admitting: Family Medicine

## 2016-05-23 ENCOUNTER — Ambulatory Visit: Payer: Medicare Other | Admitting: Family Medicine

## 2016-05-28 IMAGING — US US SOFT TISSUE HEAD/NECK
1 series · 13 of 25 positions shown · non-contrast
Comparison: Thyroid ultrasound - 12/23/2011; 08/10/2010

CLINICAL DATA: Follow-up multi nodular goiter, hoarseness for
several months, sinus issues, history of hypertension

EXAM:
THYROID ULTRASOUND
TECHNIQUE: Ultrasound examination of the thyroid gland and adjacent soft
tissues was performed.

[Series 1: us soft tissue head/neck · 0.07mm/px · 13 of 53 slices shown]
[im 1/53]
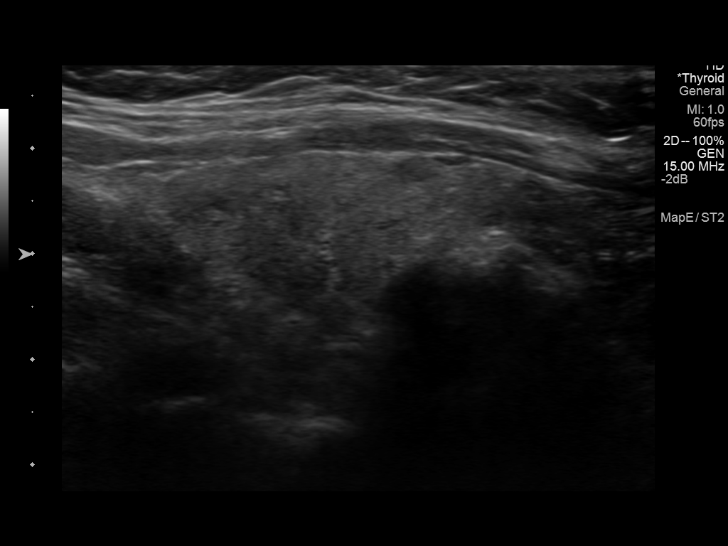
[im 5/53]
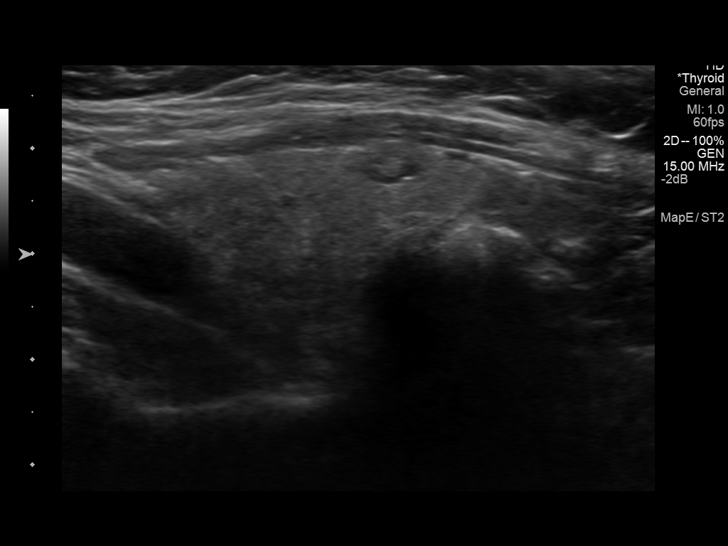
[im 9/53]
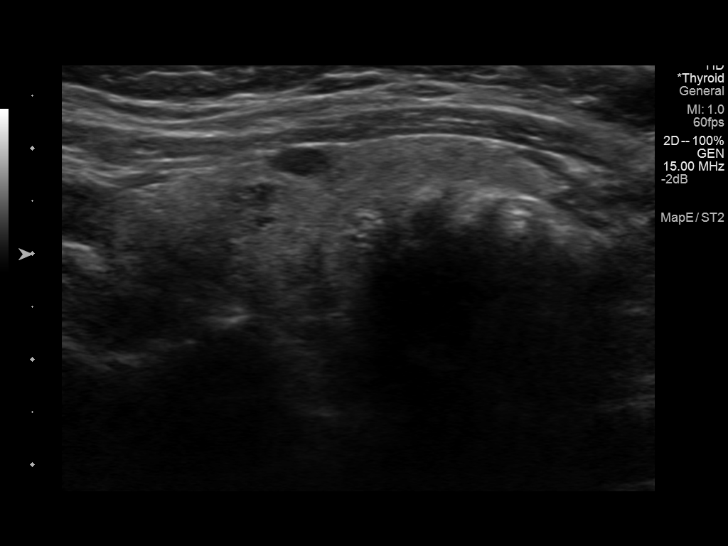
[im 14/53]
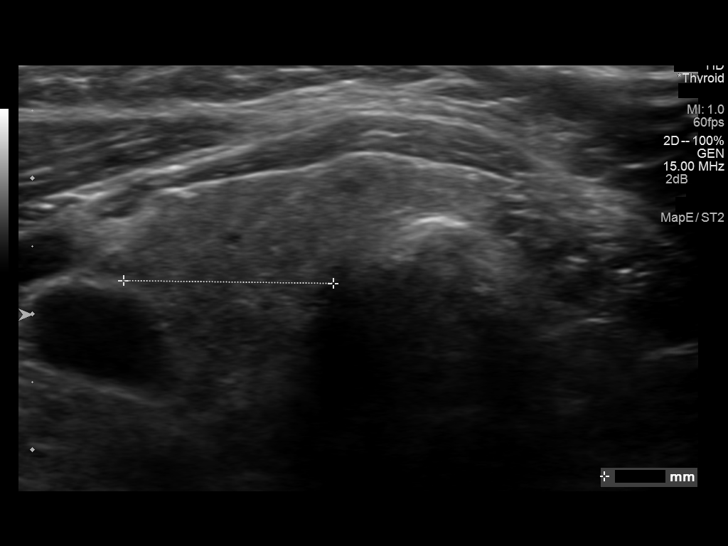
[im 18/53]
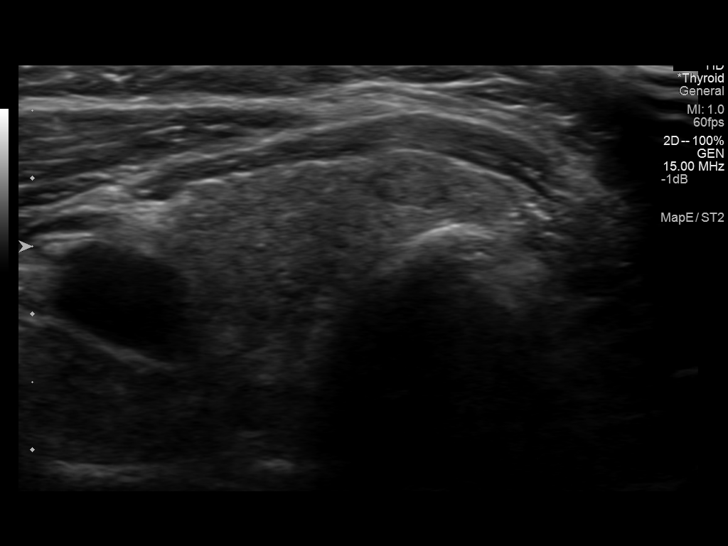
[im 22/53]
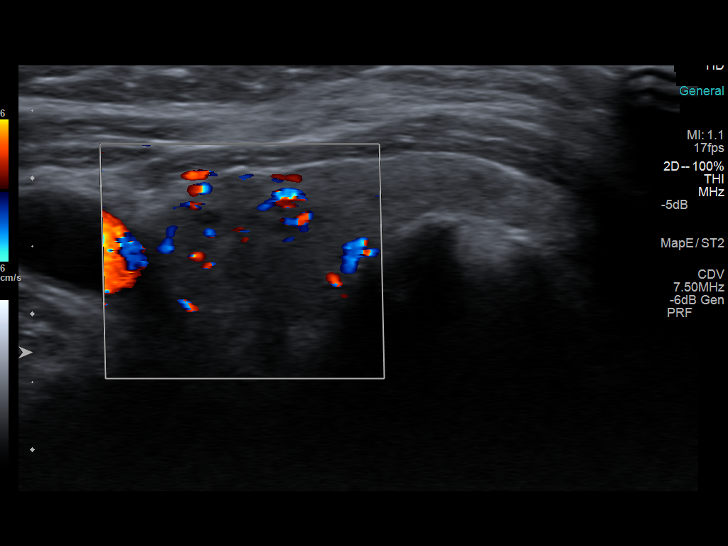
[im 27/53]
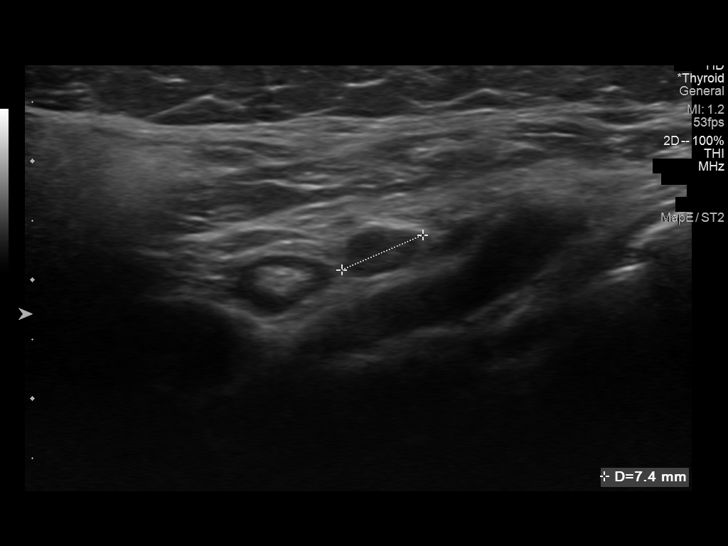
[im 31/53]
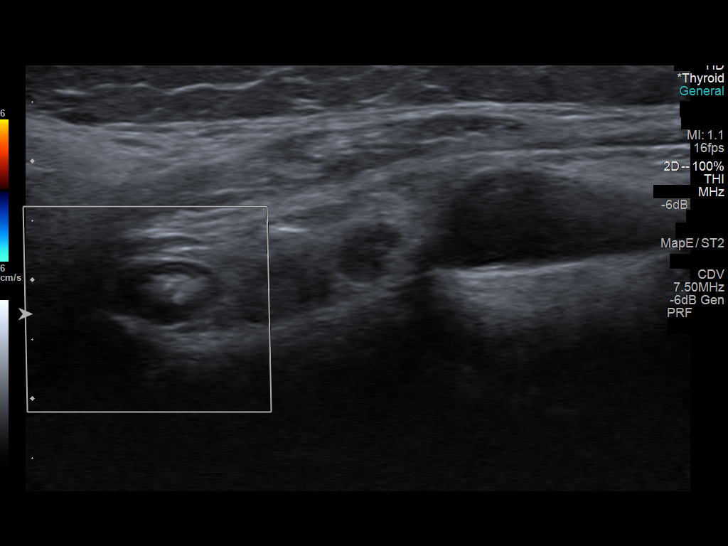
[im 35/53]
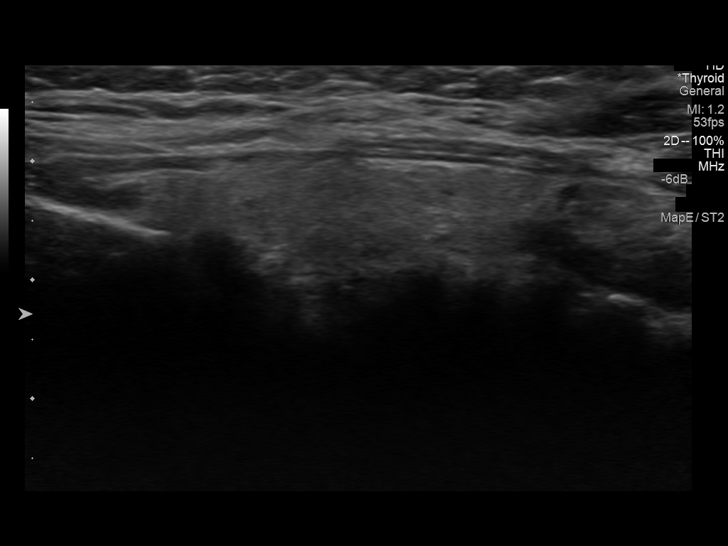
[im 40/53]
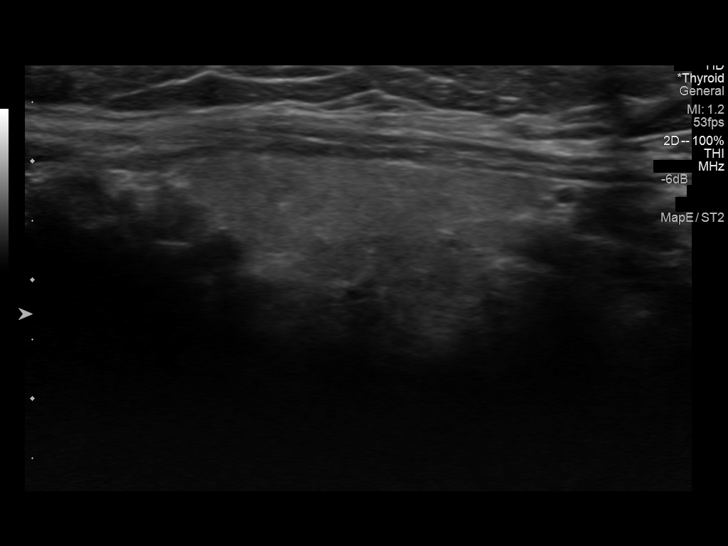
[im 44/53]
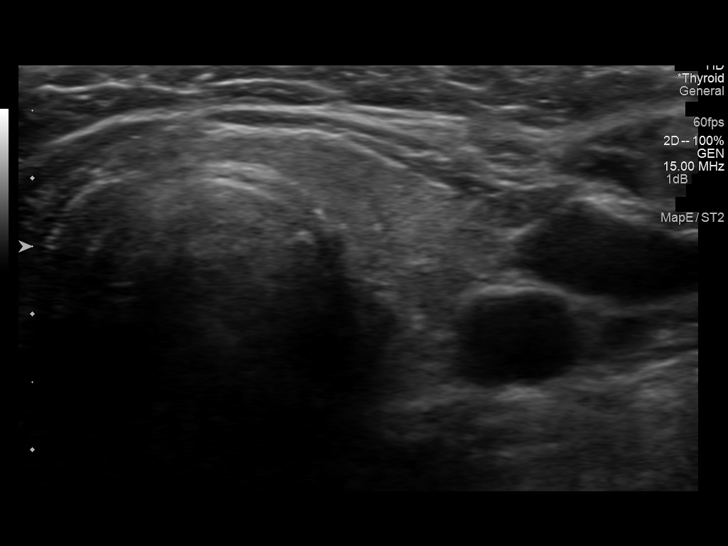
[im 48/53]
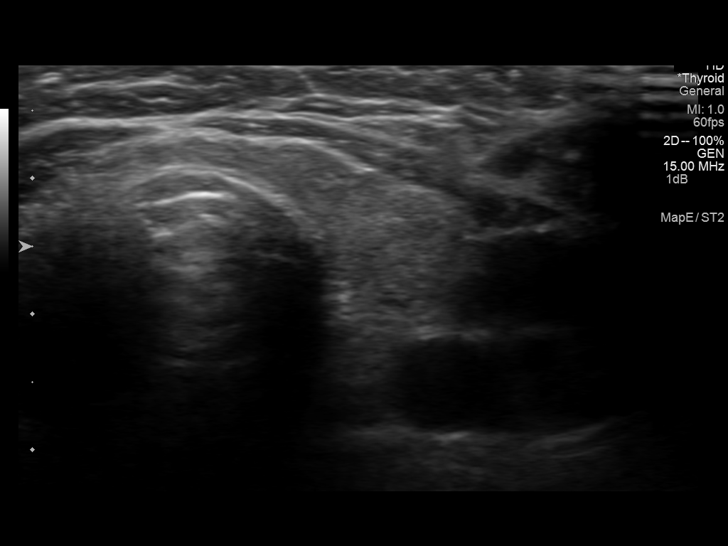
[im 53/53]
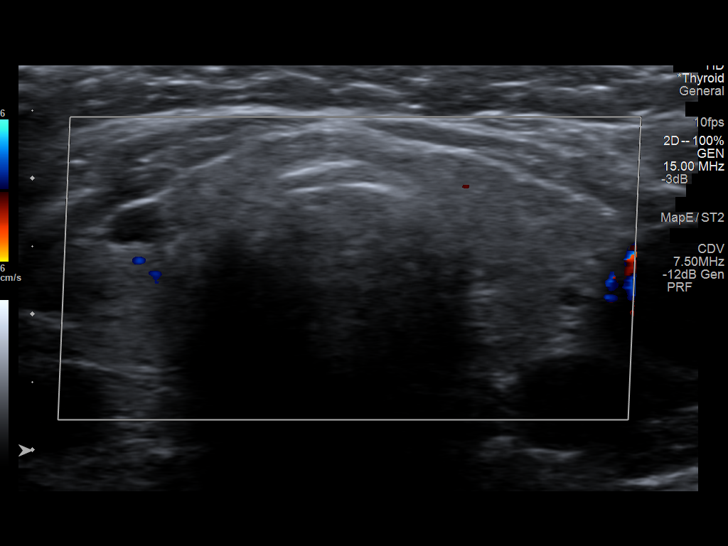

[13 of 25 positions shown; findings below may reference images not displayed]

FINDINGS: There is mild diffuse heterogeneity of the thyroid parenchymal
echotexture. No new discrete thyroid nodules.

Right thyroid lobe

Measurements: Normal in size measuring 3.5 x 1.5 x 1.6 cm.

Right, mid, medial - 0.7 x 0.4 x 0.6 cm - mixed echogenic, slightly
ill-defined with punctate internal echogenic foci with potential
ring down artifact suggestive of colloid - grossly unchanged,
previously, 0.4 x 0.3 x 0.4 cm.

Right, mid, anterior - 0.6 x 0.3 x 0.6 cm - hypoechoic, likely
cystic - unchanged, previously, 0.6 x 0.5 x 0.3 cm.

Left thyroid lobe

Measurements: Normal in size measuring 3.6 x 1.0 x 1.1 cm.

Left, superior, lateral - 0.3 cm - anechoic, likely cystic, seen in
hind site on the prior examination and unchanged.

Isthmus

Thickness: Normal in size measuring 0.3 cm in diameter..

No discrete nodules are identified within the thyroid isthmus.

Lymphadenopathy

No cervical lymphadenopathy. Shotty right cervical lymph nodes are
enlarged by size criteria was several maintaining benign fatty hila
(Representative image 40).
IMPRESSION: Grossly unchanged findings compatible with multi nodular goiter. No
new thyroid nodules. Previously measured thyroid nodules are grossly
unchanged since the [DATE] examination and again do not imaging
criteria to recommend percutaneous sampling. This recommendation
follows the consensus statement: Management of Thyroid Nodules
Detected at US: Society of Radiologists in Ultrasound Consensus

## 2016-06-12 ENCOUNTER — Ambulatory Visit (INDEPENDENT_AMBULATORY_CARE_PROVIDER_SITE_OTHER): Payer: Medicare Other | Admitting: Family Medicine

## 2016-06-12 ENCOUNTER — Encounter: Payer: Self-pay | Admitting: Family Medicine

## 2016-06-12 VITALS — BP 132/78 | HR 78 | Resp 18 | Ht 66.0 in | Wt 200.0 lb

## 2016-06-12 DIAGNOSIS — E785 Hyperlipidemia, unspecified: Secondary | ICD-10-CM | POA: Diagnosis not present

## 2016-06-12 DIAGNOSIS — I10 Essential (primary) hypertension: Secondary | ICD-10-CM

## 2016-06-12 DIAGNOSIS — G8929 Other chronic pain: Secondary | ICD-10-CM

## 2016-06-12 DIAGNOSIS — M62830 Muscle spasm of back: Secondary | ICD-10-CM

## 2016-06-12 DIAGNOSIS — F418 Other specified anxiety disorders: Secondary | ICD-10-CM | POA: Diagnosis not present

## 2016-06-12 DIAGNOSIS — F32A Depression, unspecified: Secondary | ICD-10-CM

## 2016-06-12 DIAGNOSIS — E559 Vitamin D deficiency, unspecified: Secondary | ICD-10-CM | POA: Diagnosis not present

## 2016-06-12 DIAGNOSIS — M5440 Lumbago with sciatica, unspecified side: Secondary | ICD-10-CM

## 2016-06-12 DIAGNOSIS — M25561 Pain in right knee: Secondary | ICD-10-CM

## 2016-06-12 DIAGNOSIS — F329 Major depressive disorder, single episode, unspecified: Secondary | ICD-10-CM

## 2016-06-12 DIAGNOSIS — E038 Other specified hypothyroidism: Secondary | ICD-10-CM

## 2016-06-12 DIAGNOSIS — Z23 Encounter for immunization: Secondary | ICD-10-CM

## 2016-06-12 DIAGNOSIS — M25562 Pain in left knee: Secondary | ICD-10-CM

## 2016-06-12 DIAGNOSIS — F419 Anxiety disorder, unspecified: Secondary | ICD-10-CM

## 2016-06-12 NOTE — Patient Instructions (Addendum)
Annual wellness feb 28 or after, call if you need me before  Pneumonia 23 and flu vaccine today  Fasting lipid, cmp and eGFR, Vit D when next getting labs for Dr Loanne Drilling in nov/ December  All medication for pain and muscle spasm and sleep from Dr Nelva Bush only, as we discussed, I will also send him a message  Thank you  for choosing [facility]. We consider it a privelige to serve you.  Delivering excellent health care in a caring and  compassionate way is our goal.  Partnering with you,  so that together we can achieve this goal is our strategy.

## 2016-06-16 ENCOUNTER — Encounter: Payer: Self-pay | Admitting: Family Medicine

## 2016-06-16 NOTE — Assessment & Plan Note (Signed)
Followed by nephrology and states she is told that she is "doing well"

## 2016-06-16 NOTE — Assessment & Plan Note (Signed)
After obtaining informed consent, the vaccine is  administered by LPN.  

## 2016-06-16 NOTE — Assessment & Plan Note (Signed)
Recommend muscle relaxant in place of benzo, and will ask pain management to treat spasm

## 2016-06-16 NOTE — Assessment & Plan Note (Signed)
Reports upcoming intra articular injection series planned

## 2016-06-16 NOTE — Assessment & Plan Note (Signed)
Improved , continue management per pain Specialist

## 2016-06-16 NOTE — Progress Notes (Signed)
Regina Horton     MRN: [id number]      DOB: [birth year]   HPI Regina Horton is here for follow up and re-evaluation of chronic medical conditions, medication management and review of any available recent lab and radiology data.  Preventive health is updated, specifically  Cancer screening and Immunization.  Needs both colonoscopy and mammogram, states she will get these done Questions or concerns regarding consultations or procedures which the PT has had in the interim are  Addressed.improved pain management through pain clinic for spine and orthopedic Doc for right knee, considering intraarticular simvisc for knee The PT denies any adverse reactions to current medications since the last visit.  Sttaes she has been told and is aware now of the danger of medications, and I have discussed wit her that I  Will not prescribe her benzos anymore and relate this to pain management, leaving them to prescribe appropriately, goal being to taper off , she agrees Uses benzo for sleep and for muscle spasm  ROS Denies recent fever or chills. Denies sinus pressure, nasal congestion, ear pain or sore throat. Denies chest congestion, productive cough or wheezing. Denies chest pains, palpitations and leg swelling Denies abdominal pain, nausea, vomiting,diarrhea or constipation.   Denies dysuria, frequency, hesitancy or incontinence. C/o chronic  joint pain, swelling and limitation in mobility. Denies headaches, seizures, numbness, or tingling. Denies depression, uncontrolled  anxiety , states level of function is improved, has  Insomnia and has relied on benzo for this  Denies skin break down or rash.   PE  BP 132/78   Pulse 78   Resp 18   Ht 5\' 6"  (1.676 m)   Wt 200 lb (90.7 kg)   SpO2 95%   BMI 32.28 kg/m   Patient alert and oriented and in no cardiopulmonary distress.  HEENT: No facial asymmetry, EOMI,   oropharynx pink and moist.  Neck supple no JVD, no mass.  Chest: Clear to auscultation  bilaterally.  CVS: S1, S2 no murmurs, no S3.Regular rate.  ABD: Soft non tender.   Ext: No edema  MS: decreased  ROM spine,  hips and knees.  Skin: Intact, no ulcerations or rash noted.  Psych: Good eye contact, normal affect. Memory intact not anxious or depressed appearing.  CNS: CN 2-12 intact, power,  normal throughout.no focal deficits noted.   Assessment & Plan  Essential hypertension Controlled, no change in medication DASH diet and commitment to daily physical activity for a minimum of 30 minutes discussed and encouraged, as a part of hypertension management. The importance of attaining a healthy weight is also discussed.  BP/Weight 06/12/2016 01/09/2016 10/16/2015 09/14/2015 08/10/2015 07/17/2015 83/66/2947  Systolic BP 654 650 354 656 812 751 700  Diastolic BP 78 76 70 76 86 51 62  Wt. (Lbs) 200 202 207 203 201 - -  BMI 32.28 32.62 34.45 33.78 33.45 - -       Anxiety and depression Improved, reports improved level of function since in current pain clinic, has been receiving facet joint injections  Bilateral low back pain with sciatica Improved , continue management per pain Specialist  Spasm of back muscles Recommend muscle relaxant in place of benzo, and will ask pain management to treat spasm  Knee pain, bilateral Reports upcoming intra articular injection series planned  Hypothyroidism Managed by endo, and controlled  Chronic kidney disease Followed by nephrology and states she is told that she is "doing well"  Need for 23-polyvalent pneumococcal polysaccharide vaccine After obtaining  informed consent, the vaccine is  administered by LPN.   Need for prophylactic vaccination and inoculation against influenza After obtaining informed consent, the vaccine is  administered by LPN.

## 2016-06-16 NOTE — Assessment & Plan Note (Signed)
Improved, reports improved level of function since in current pain clinic, has been receiving facet joint injections

## 2016-06-16 NOTE — Assessment & Plan Note (Signed)
Controlled, no change in medication DASH diet and commitment to daily physical activity for a minimum of 30 minutes discussed and encouraged, as a part of hypertension management. The importance of attaining a healthy weight is also discussed.  BP/Weight 06/12/2016 01/09/2016 10/16/2015 09/14/2015 08/10/2015 07/17/2015 03/79/4446  Systolic BP 190 122 241 146 431 427 670  Diastolic BP 78 76 70 76 86 51 62  Wt. (Lbs) 200 202 207 203 201 - -  BMI 32.28 32.62 34.45 33.78 33.45 - -

## 2016-06-16 NOTE — Assessment & Plan Note (Signed)
Managed by endo, and controlled

## 2016-06-20 ENCOUNTER — Other Ambulatory Visit: Payer: Self-pay | Admitting: Family Medicine

## 2016-06-20 ENCOUNTER — Telehealth: Payer: Self-pay | Admitting: Family Medicine

## 2016-06-20 DIAGNOSIS — Z1231 Encounter for screening mammogram for malignant neoplasm of breast: Secondary | ICD-10-CM

## 2016-06-20 NOTE — Telephone Encounter (Signed)
I contacted pt directly to see how she was managing as far as spasms were concerned , now that she no longer has valium or ativan prescribed. Crying stated spasms were very ba esp at night and muscle relaxants would damage her kidneys. I advised I will directly contact her pain Doc Ramos, to see if he feels that  With the pain medication he prescribes , it is "in her best interest" / safe to resume diazepam 5mg  at bedtime She rescheduled her appt with him today as she was in eD last nioght with spouse , is rescheduled for 07/17/2016

## 2016-06-27 ENCOUNTER — Other Ambulatory Visit: Payer: Self-pay | Admitting: Endocrinology

## 2016-06-27 ENCOUNTER — Other Ambulatory Visit: Payer: Self-pay | Admitting: Family Medicine

## 2016-07-03 ENCOUNTER — Ambulatory Visit (HOSPITAL_COMMUNITY)
Admission: RE | Admit: 2016-07-03 | Discharge: 2016-07-03 | Disposition: A | Discharge status: Home / Self Care | Payer: Medicare Other | Source: Ambulatory Visit | Attending: Family Medicine | Admitting: Family Medicine

## 2016-07-03 DIAGNOSIS — Z1231 Encounter for screening mammogram for malignant neoplasm of breast: Secondary | ICD-10-CM

## 2016-07-08 ENCOUNTER — Other Ambulatory Visit: Payer: Self-pay | Admitting: Family Medicine

## 2016-07-08 DIAGNOSIS — R928 Other abnormal and inconclusive findings on diagnostic imaging of breast: Secondary | ICD-10-CM

## 2016-07-09 DIAGNOSIS — M170 Bilateral primary osteoarthritis of knee: Secondary | ICD-10-CM | POA: Diagnosis not present

## 2016-07-17 DIAGNOSIS — M961 Postlaminectomy syndrome, not elsewhere classified: Secondary | ICD-10-CM | POA: Diagnosis not present

## 2016-07-17 DIAGNOSIS — Z79891 Long term (current) use of opiate analgesic: Secondary | ICD-10-CM | POA: Diagnosis not present

## 2016-07-17 DIAGNOSIS — G894 Chronic pain syndrome: Secondary | ICD-10-CM | POA: Diagnosis not present

## 2016-07-17 DIAGNOSIS — M4310 Spondylolisthesis, site unspecified: Secondary | ICD-10-CM | POA: Diagnosis not present

## 2016-07-18 DIAGNOSIS — M170 Bilateral primary osteoarthritis of knee: Secondary | ICD-10-CM | POA: Diagnosis not present

## 2016-07-19 ENCOUNTER — Other Ambulatory Visit: Payer: Self-pay | Admitting: Family Medicine

## 2016-07-19 DIAGNOSIS — R928 Other abnormal and inconclusive findings on diagnostic imaging of breast: Secondary | ICD-10-CM

## 2016-07-23 ENCOUNTER — Encounter (HOSPITAL_COMMUNITY): Payer: Medicare Other

## 2016-07-25 DIAGNOSIS — M170 Bilateral primary osteoarthritis of knee: Secondary | ICD-10-CM | POA: Diagnosis not present

## 2016-07-30 ENCOUNTER — Telehealth: Payer: Self-pay | Admitting: Endocrinology

## 2016-07-30 ENCOUNTER — Other Ambulatory Visit: Payer: Self-pay | Admitting: Family Medicine

## 2016-07-30 NOTE — Telephone Encounter (Signed)
Is it ok to use the lanet manufacturer for levothyroxine  Calling from [facility] # [phone]

## 2016-07-30 NOTE — Telephone Encounter (Signed)
See message and please advise, Thanks!  

## 2016-07-30 NOTE — Telephone Encounter (Signed)
ok 

## 2016-07-30 NOTE — Telephone Encounter (Signed)
Pharmacy notified.

## 2016-08-01 DIAGNOSIS — M961 Postlaminectomy syndrome, not elsewhere classified: Secondary | ICD-10-CM | POA: Diagnosis not present

## 2016-08-06 ENCOUNTER — Ambulatory Visit (HOSPITAL_COMMUNITY)
Admission: RE | Admit: 2016-08-06 | Discharge: 2016-08-06 | Disposition: A | Discharge status: Home / Self Care | Payer: Medicare Other | Source: Ambulatory Visit | Attending: Family Medicine | Admitting: Family Medicine

## 2016-08-06 DIAGNOSIS — R922 Inconclusive mammogram: Secondary | ICD-10-CM | POA: Diagnosis not present

## 2016-08-06 DIAGNOSIS — R928 Other abnormal and inconclusive findings on diagnostic imaging of breast: Secondary | ICD-10-CM

## 2016-08-06 DIAGNOSIS — Z1231 Encounter for screening mammogram for malignant neoplasm of breast: Secondary | ICD-10-CM | POA: Diagnosis not present

## 2016-08-09 ENCOUNTER — Ambulatory Visit: Payer: Medicare Other | Admitting: Endocrinology

## 2016-08-25 NOTE — Progress Notes (Signed)
Subjective:    Patient ID: Regina Horton, female    DOB: 12/28/1950, 66 y.o.   MRN: [id number]  HPI Pt returns for f/u of a small multinodular goiter and hypothyroidism (both dx'ed 2011; f/u US in 2015 showed nodules were again too small to need bx; she has been on synthroid since 2011).  She does not notice any lump in the neck.  pt states she feels well in general, except for fatigue.   Past Medical History:  Diagnosis Date  . Allergic rhinitis   . Angina at rest [employer])   . Anxiety   . Aortic sclerosis   . Chronic back pain   . Chronic constipation   . CKD (chronic kidney disease)   . Depression   . Fibromyalgia   . GERD (gastroesophageal reflux disease)   . Hyperlipidemia   . Hypertension   . Hypertension   . Mitral regurgitation   . Palpitations   . Thyroid disease   . Tricuspid regurgitation     Past Surgical History:  Procedure Laterality Date  . BACK SURGERY  2005  . BREAST LUMPECTOMY  1970   right   . CATARACT EXTRACTION W/PHACO Right 06/15/2015   Procedure: CATARACT EXTRACTION PHACO AND INTRAOCULAR LENS PLACEMENT (IOC);  Surgeon: Tonny Branch, MD;  Location: [facility];  Service: Ophthalmology;  Laterality: Right;  CDE: 10.33  . CATARACT EXTRACTION W/PHACO Left 07/17/2015   Procedure: CATARACT EXTRACTION PHACO AND INTRAOCULAR LENS PLACEMENT (IOC);  Surgeon: Tonny Branch, MD;  Location: [facility];  Service: Ophthalmology;  Laterality: Left;  CDE:7.60  . NM MYOCAR PERF WALL MOTION  02/16/2009   normal  . ORIF ANKLE FRACTURE Left 10/26/2014   Procedure: OPEN REDUCTION INTERNAL FIXATION (ORIF) ANKLE FRACTURE;  Surgeon: Sanjuana Kava, MD;  Location: [facility];  Service: Orthopedics;  Laterality: Left;  . TONSILLECTOMY  childhood   . TUBAL LIGATION  1973  . US ECHOCARDIOGRAPHY  12/19/2008   mild mitral annular ca+,AOV mildly sclerotic,mild MR & TR    Social History   Social History  . Marital status: Married    Spouse name: N/A  . Number of children: 2  . Years of education: N/A    Occupational History  . disabled     Social History Main Topics  . Smoking status: Former Smoker    Years: 20.00  . Smokeless tobacco: Not on file  . Alcohol use No  . Drug use:     Types: Marijuana     Comment: yesterday  . Sexual activity: Yes    Birth control/ protection: Surgical   Other Topics Concern  . Not on file   Social History Narrative  . No narrative on file    Current Outpatient Prescriptions on File Prior to Visit  Medication Sig Dispense Refill  . atorvastatin (LIPITOR) 40 MG tablet TAKE ONE TABLET BY MOUTH ONCE DAILY 30 tablet 3  . Calcium-Vitamin D 600-125 MG-UNIT TABS Take 1 tablet by mouth daily.     . Cholecalciferol (VITAMIN D) 1000 UNITS capsule Take 1,000 Units by mouth 2 (two) times daily.     . Choline Fenofibrate (FENOFIBRIC ACID) 135 MG CPDR TAKE ONE CAPSULE BY MOUTH ONCE DAILY 30 capsule 3  . diltiazem (CARTIA XT) 240 MG 24 hr capsule Take 1 capsule (240 mg total) by mouth daily. 90 capsule 3  . DULoxetine (CYMBALTA) 60 MG capsule Take 1 capsule (60 mg total) by mouth daily. 30 capsule 4  . gabapentin (NEURONTIN) 800 MG tablet TAKE ONE  TABLET BY MOUTH TWICE DAILY 180 tablet 1  . levocetirizine (XYZAL) 5 MG tablet TAKE ONE TABLET BY MOUTH ONCE DAILY 90 tablet 1  . metoprolol succinate (TOPROL-XL) 100 MG 24 hr tablet TAKE ONE TABLET BY MOUTH ONCE DAILY 90 tablet 1  . Multiple Vitamins-Iron (QC DAILY MULTIVITAMINS/IRON PO) Take 1 tablet by mouth once a week.     . omega-3 acid ethyl esters (LOVAZA) 1 g capsule TAKE TWO CAPSULES BY MOUTH TWICE DAILY 180 capsule 3  . pantoprazole (PROTONIX) 40 MG tablet TAKE ONE TABLET BY MOUTH ONCE DAILY 90 tablet 1  . raloxifene (EVISTA) 60 MG tablet Take 1 tablet (60 mg total) by mouth daily. 30 tablet 3  . sennosides-docusate sodium (SENOKOT-S) 8.6-50 MG tablet Take 1 tablet by mouth at bedtime.      No current facility-administered medications on file prior to visit.     Allergies  Allergen Reactions  .  Codeine     Pt states she does not have any problems with codeine.  . Nickel Rash    Family History  Problem Relation Age of Onset  . Rheum arthritis Mother   . Osteoporosis Mother   . Heart disease Father   . Diabetes Father   . Hyperlipidemia Father   . Cancer Father     bladder   . Heart disease Brother   . Arthritis Brother     BP 132/84   Pulse 72   Ht 5\' 6"  (1.676 m)   Wt 203 lb (92.1 kg)   SpO2 96%   BMI 32.77 kg/m    Review of Systems No weight change.    Objective:   Physical Exam VITAL SIGNS:  See vs page.  GENERAL: no distress.  NECK: There is no palpable thyroid enlargement.  No thyroid nodule is palpable.  No palpable lymphadenopathy at the anterior neck.    Lab Results  Component Value Date   TSH 0.31 (L) 08/27/2016      Assessment & Plan:  Hypothyroidism: slightly overcontrolled.  Reduce synthroid.

## 2016-08-27 ENCOUNTER — Encounter: Payer: Self-pay | Admitting: Endocrinology

## 2016-08-27 ENCOUNTER — Ambulatory Visit (INDEPENDENT_AMBULATORY_CARE_PROVIDER_SITE_OTHER): Payer: Medicare Other | Admitting: Endocrinology

## 2016-08-27 VITALS — BP 132/84 | HR 72 | Ht 66.0 in | Wt 203.0 lb

## 2016-08-27 DIAGNOSIS — E038 Other specified hypothyroidism: Secondary | ICD-10-CM | POA: Diagnosis not present

## 2016-08-27 LAB — TSH: TSH: 0.31 u[IU]/mL — AB (ref 0.35–4.50)

## 2016-08-27 MED ORDER — LEVOTHYROXINE SODIUM 50 MCG PO TABS: 50.0000 ug | ORAL_TABLET | Freq: Every day | ORAL | 3 refills | Status: DC

## 2016-08-27 NOTE — Patient Instructions (Addendum)
We can skip the ultrasound for now.   blood tests are requested for you today.  We'll let you know about the results. Please return in 1 year.

## 2016-09-01 ENCOUNTER — Other Ambulatory Visit: Payer: Self-pay | Admitting: Family Medicine

## 2016-09-03 ENCOUNTER — Telehealth: Payer: Self-pay | Admitting: Family Medicine

## 2016-09-03 NOTE — Telephone Encounter (Signed)
Left message that appt scheduled for 10/17/2016 at 2:15 would need to be changed to 1:15pm and asked her to call to confirm that she is available at that time.

## 2016-09-17 ENCOUNTER — Encounter: Payer: Self-pay | Admitting: Cardiovascular Disease

## 2016-09-17 ENCOUNTER — Ambulatory Visit (INDEPENDENT_AMBULATORY_CARE_PROVIDER_SITE_OTHER): Payer: Medicare Other | Admitting: Cardiovascular Disease

## 2016-09-17 VITALS — BP 192/60 | HR 66 | Ht 69.0 in | Wt 202.0 lb

## 2016-09-17 DIAGNOSIS — I10 Essential (primary) hypertension: Secondary | ICD-10-CM | POA: Diagnosis not present

## 2016-09-17 DIAGNOSIS — E782 Mixed hyperlipidemia: Secondary | ICD-10-CM | POA: Diagnosis not present

## 2016-09-17 DIAGNOSIS — F418 Other specified anxiety disorders: Secondary | ICD-10-CM

## 2016-09-17 DIAGNOSIS — G8929 Other chronic pain: Secondary | ICD-10-CM

## 2016-09-17 DIAGNOSIS — N184 Chronic kidney disease, stage 4 (severe): Secondary | ICD-10-CM | POA: Diagnosis not present

## 2016-09-17 DIAGNOSIS — F329 Major depressive disorder, single episode, unspecified: Secondary | ICD-10-CM

## 2016-09-17 DIAGNOSIS — M549 Dorsalgia, unspecified: Secondary | ICD-10-CM

## 2016-09-17 DIAGNOSIS — K219 Gastro-esophageal reflux disease without esophagitis: Secondary | ICD-10-CM

## 2016-09-17 DIAGNOSIS — F32A Depression, unspecified: Secondary | ICD-10-CM

## 2016-09-17 DIAGNOSIS — F419 Anxiety disorder, unspecified: Secondary | ICD-10-CM

## 2016-09-17 MED ORDER — FENOFIBRATE 160 MG PO TABS: 160.0000 mg | ORAL_TABLET | Freq: Every day | ORAL | 3 refills | Status: DC

## 2016-09-17 MED ORDER — METOPROLOL SUCCINATE ER 100 MG PO TB24: ORAL_TABLET | ORAL | 3 refills | Status: DC

## 2016-09-17 NOTE — Patient Instructions (Addendum)
Your physician wants you to follow-up in: 6 months or sooner if needed. You will receive a reminder letter in the mail two months in advance. If you don't receive a letter, please call our office to schedule the follow-up appointment.  Your physician has recommended you make the following change in your medication:   1.) the metoprolol  succ has been changed to 100 mg( 1 tablet)  in the morning and 50 mg in the evening.( 1/2 tablet)  2.) the fenofibric 135 mg caps were changed to the fenofibrate 160mg .  3.) the prescription fish oil has been changed to the over the counter fish oil since your insurance will no longer cover the prescribed formula.  If you need a refill on your cardiac medications before your next appointment, please call your pharmacy.

## 2016-09-17 NOTE — Progress Notes (Signed)
Patient ID: Regina Horton, female   DOB: 28-Aug-1950, 66 y.o.   MRN: [id number]      Primary MD:  Dr. Tula Nakayama  HPI: Regina Horton is a 66 y.o. female who presents to the office today for a 12 month follow up cardiology evaluation.  Regina Horton has a history of palpitations which have been controlled with beta blocker therapy, hypertension, mixed hyperlipidemia, obesity, mild renal insufficiency, and chronic low back pain.  An echo Doppler study in 2010  showed mild aortic sclerosis without stenosis, mild mitral and calcification with mild MR, and mild TR, and normal systolic function.  A nuclear perfusion study showed normal perfusion.  She has a significant history of depression and takes Cymbalta, both for depression, as well as for chronic low back pain.  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  She walks with a cane.  Her low back pain has been debilitating.  Since I last saw her one year ago, she states that she has progressed to develop stage IV chronic kidney disease.  She is follow-up Dr. Birdie Sons.  He has had issues with chronic pain in particular back pain.  She has seen Dr. Jeanell Sparrow most for pain management.  She recently stopped taking her pain medications due to concerns at the were adversely affecting her.  She continues to have issues with depression and cries often, because her back hurts.  She also has noticed some palpitations.  She sees Dr. Moshe Cipro, who will be checking complete laboratory.  He states that she will ultimately need knee replacement surgery.  At times she has had issues with leg swelling.  She denies exertional chest pain.  She denies presyncope or syncope.  She presents for evaluation.   Past Medical History:  Diagnosis Date  . Allergic rhinitis   . Angina at rest [facility])   . Anxiety   . Aortic sclerosis   . Chronic back pain   . Chronic constipation   .  CKD (chronic kidney disease)   . Depression   . Fibromyalgia   . GERD (gastroesophageal reflux disease)   . Hyperlipidemia   . Hypertension   . Hypertension   . Mitral regurgitation   . Palpitations   . Thyroid disease   . Tricuspid regurgitation     Past Surgical History:  Procedure Laterality Date  . BACK SURGERY  2005  . BREAST LUMPECTOMY  1970   right   . CATARACT EXTRACTION W/PHACO Right 06/15/2015   Procedure: CATARACT EXTRACTION PHACO AND INTRAOCULAR LENS PLACEMENT (IOC);  Surgeon: Tonny Branch, MD;  Location: [facility];  Service: Ophthalmology;  Laterality: Right;  CDE: 10.33  . CATARACT EXTRACTION W/PHACO Left 07/17/2015   Procedure: CATARACT EXTRACTION PHACO AND INTRAOCULAR LENS PLACEMENT (IOC);  Surgeon: Tonny Branch, MD;  Location: [facility];  Service: Ophthalmology;  Laterality: Left;  CDE:7.60  . NM MYOCAR PERF WALL MOTION  02/16/2009   normal  . ORIF ANKLE FRACTURE Left 10/26/2014   Procedure: OPEN REDUCTION INTERNAL FIXATION (ORIF) ANKLE FRACTURE;  Surgeon: Sanjuana Kava, MD;  Location: [facility];  Service: Orthopedics;  Laterality: Left;  . TONSILLECTOMY  childhood   . TUBAL LIGATION  1973  . US ECHOCARDIOGRAPHY  12/19/2008   mild mitral annular ca+,AOV mildly sclerotic,mild MR & TR    Allergies  Allergen Reactions  . Codeine     Pt states she does not have any problems  with codeine.  . Nickel Rash    Current Outpatient Prescriptions  Medication Sig Dispense Refill  . atorvastatin (LIPITOR) 40 MG tablet TAKE ONE TABLET BY MOUTH ONCE DAILY 30 tablet 3  . Calcium-Vitamin D 600-125 MG-UNIT TABS Take 1 tablet by mouth daily.     . Cholecalciferol (VITAMIN D) 1000 UNITS capsule Take 1,000 Units by mouth 2 (two) times daily.     Marland Kitchen diltiazem (CARTIA XT) 240 MG 24 hr capsule Take 1 capsule (240 mg total) by mouth daily. 90 capsule 3  . DULoxetine (CYMBALTA) 60 MG capsule Take 1 capsule (60 mg total) by mouth daily. 30 capsule 4  . gabapentin (NEURONTIN) 800 MG tablet TAKE ONE  TABLET BY MOUTH TWICE DAILY 180 tablet 1  . levocetirizine (XYZAL) 5 MG tablet TAKE ONE TABLET BY MOUTH ONCE DAILY 90 tablet 1  . levothyroxine (SYNTHROID, LEVOTHROID) 50 MCG tablet Take 1 tablet (50 mcg total) by mouth daily. 90 tablet 3  . metoprolol succinate (TOPROL-XL) 100 MG 24 hr tablet Take 1 tablet in the morning and 1/2 tablet in the evening. 135 tablet 3  . Multiple Vitamins-Iron (QC DAILY MULTIVITAMINS/IRON PO) Take 1 tablet by mouth once a week.     . pantoprazole (PROTONIX) 40 MG tablet TAKE ONE TABLET BY MOUTH ONCE DAILY 90 tablet 1  . raloxifene (EVISTA) 60 MG tablet Take 1 tablet (60 mg total) by mouth daily. 30 tablet 3  . sennosides-docusate sodium (SENOKOT-S) 8.6-50 MG tablet Take 1 tablet by mouth at bedtime.     . fenofibrate 160 MG tablet Take 1 tablet (160 mg total) by mouth daily. 90 tablet 3   No current facility-administered medications for this visit.     Social History   Social History  . Marital status: Married    Spouse name: N/A  . Number of children: 2  . Years of education: N/A   Occupational History  . disabled     Social History Main Topics  . Smoking status: Former Smoker    Years: 20.00  . Smokeless tobacco: Never Used  . Alcohol use No  . Drug use: Yes    Types: Marijuana     Comment: yesterday  . Sexual activity: Yes    Birth control/ protection: Surgical   Other Topics Concern  . Not on file   Social History Narrative  . No narrative on file    Family History  Problem Relation Age of Onset  . Rheum arthritis Mother   . Osteoporosis Mother   . Heart disease Father   . Diabetes Father   . Hyperlipidemia Father   . Cancer Father     bladder   . Heart disease Brother   . Arthritis Brother     ROS General: Negative; No fevers, chills, or night sweats HEENT: Negative; No changes in vision or hearing, sinus congestion, difficulty swallowing Pulmonary: Negative; No cough, wheezing, shortness of breath,  hemoptysis Cardiovascular: See HPI: No chest pain, presyncope, syncope, palpitations Positive for occasional ankle swelling GI: Positive for GERD. No nausea, vomiting, diarrhea, or abdominal pain GU: Negative; No dysuria, hematuria, or difficulty voiding Musculoskeletal: Positive for chronic low back pain and knee discomfort Hematologic: Negative; no easy bruising, bleeding Endocrine: Positive for hypothyroidism.  No diabetes Neuro: Negative; no changes in balance, headaches Skin: Negative; No rashes or skin lesions Psychiatric: Positive for depression and anxiety Sleep: Negative; No snoring,  daytime sleepiness, hypersomnolence, bruxism, restless legs, hypnogognic hallucinations. Other comprehensive 14 point system review is negative  Physical Exam BP (!) 192/60   Pulse 66   Ht '5\' 9"'  (1.753 m)   Wt 202 lb (91.6 kg)   BMI 29.83 kg/m    Repeat blood pressure by me was 140/64.  During the evaluation she had several crying spells.  Wt Readings from Last 3 Encounters:  09/17/16 202 lb (91.6 kg)  08/27/16 203 lb (92.1 kg)  06/12/16 200 lb (90.7 kg)   General: Alert, oriented, no distress.  She was tearful when thinking about problems. Skin: normal turgor, no rashes, warm and dry HEENT: Normocephalic, atraumatic. Pupils equal round and reactive to light; sclera anicteric; extraocular muscles intact, No lid lag; Nose without nasal septal hypertrophy; Mouth/Parynx benign; Mallinpatti scale 3 Neck: No JVD, no carotid bruits; normal carotid upstroke Lungs: clear to ausculatation and percussion bilaterally; no wheezing or rales, normal inspiratory and expiratory effort Chest wall: without tenderness to palpitation Heart: PMI not displaced, RRR, s1 s2 normal, 4-1/5 semsystolic murmur, No diastolic murmur, no rubs, gallops, thrills, or heaves Abdomen:  Central adiposity;soft, nontender; no hepatosplenomehaly, BS+; abdominal aorta nontender and not dilated by palpation. Back: no CVA  tenderness Pulses: 2+  Musculoskeletal: full range of motion, normal strength, no joint deformities Extremities: Pulses 2+, no clubbing cyanosis or edema, Homan's sign negative  Neurologic: grossly nonfocal; Cranial nerves grossly wnl Psychologic: Tearful and had several crying spells illicted by family stress with children  ECG (independently read by me): Sinus rhythm with incomplete right bundle branch block.  Nonspecific ST-T changes.  QTc interval 431 Regina, PR interval 156 Regina.  September 14 2015 ECG (independently read by me):  Sinus bradycardia 57 bpm.  Incomplete right bundle branch block.  September 2015ECG (independently read by me): Normal sinus rhythm with incomplete right bundle branch block.  LABS:  BMP Latest Ref Rng & Units 09/28/2015 06/09/2015 05/12/2015  Glucose 65 - 99 mg/dL 96 91 75  BUN 8 - 27 mg/dL 19 21(H) 21  Creatinine 0.57 - 1.00 mg/dL 1.47(H) 1.45(H) 1.44(H)  BUN/Creat Ratio 11 - 26 13 - -  Sodium 134 - 144 mmol/L 146(H) 142 142  Potassium 3.5 - 5.2 mmol/L 4.6 3.8 4.3  Chloride 96 - 106 mmol/L 105 106 104  CO2 18 - 29 mmol/L '27 30 28  ' Calcium 8.7 - 10.3 mg/dL 9.7 9.6 9.6   Hepatic Function Latest Ref Rng & Units 09/28/2015 05/12/2015 07/07/2014  Total Protein 6.0 - 8.5 g/dL 6.1 6.4 6.8  Albumin 3.6 - 4.8 g/dL 3.7 4.1 4.2  AST 0 - 40 IU/L '20 21 19  ' ALT 0 - 32 IU/L 6 9 <8  Alk Phosphatase 39 - 117 IU/L 46 42 53  Total Bilirubin 0.0 - 1.2 mg/dL 0.3 0.5 0.6  Bilirubin, Direct 0.0 - 0.3 mg/dL - - -   CBC Latest Ref Rng & Units 09/28/2015 06/09/2015 01/09/2015  WBC 3.4 - 10.8 x10E3/uL 8.2 9.8 12.4(H)  Hemoglobin 12.0 - 15.0 g/dL - 13.3 12.2  Hematocrit 34.0 - 46.6 % 36.7 39.4 35.8(L)  Platelets 150 - 379 x10E3/uL 417(H) 292 346   Lab Results  Component Value Date   MCV 92 09/28/2015   MCV 96.3 06/09/2015   MCV 91.1 01/09/2015   Lab Results  Component Value Date   TSH 0.31 (L) 08/27/2016   Lab Results  Component Value Date   HGBA1C 4.8 07/07/2014   Lipid  Panel     Component Value Date/Time   CHOL 122 09/28/2015 1327   TRIG 158 (H) 09/28/2015 1327   HDL  33 (L) 09/28/2015 1327   CHOLHDL 3.0 05/12/2015 1459   VLDL 30 05/12/2015 1459   LDLCALC 57 09/28/2015 1327    RADIOLOGY: No results found.  IMPRESSION: 1. Essential hypertension   2. Kidney disease, chronic, stage IV (GFR 15-29 ml/min) (HCC)   3. Mixed hyperlipidemia   4. Anxiety and depression   5. Gastroesophageal reflux disease without esophagitis   6. Chronic back pain, unspecified back location, unspecified back pain laterality     ASSESSMENT AND PLAN: Regina Horton is a 66 year old female who has a history of depression that is  labile with episodic crying spells.  She has been taking Cymbalta 60 mg daily.  She has a history of hypertension and most recently has been on diltiazem to her 40 mg daily, Toprol-XL 100 mg.  She has noticed occasional palpitations particularly when she gets upset despite taking this present regimen.  I have suggested that she adjust her Toprol-XL and take 100 mg in the morning but also take 50 mg at bedtime.  I last saw her, she had stage III chronic kidney disease, but she tells me she is progressed into stage IV.  He has GERD which is controlled with Protonix.   He has lost weight over the year and is no longer in the obese category, but overweight with a BMI of 29.8.  She will monitor her heart rhythm to see if her palpitations improved with her increased beta blocker regimen.  She has hyperlipidemia with her tori last year showing an LDL of 57 on atorvastatin 40 mg in addition to omega-3 fatty acids and fenofibrate acid 135 mg.  Apparently her insurance company no longer cover this and I will change his to fenofibrate 160 mg..  She continues to be on levothyroxine 9 and her dose was reduced to 50 g just recently.  She will be seeing Dr. Moshe Cipro, who will recheck blood work.  I will see her in 6 months for cardiology reevaluation.   Time spent: 25  minutes  Troy Sine, MD, [facility]  09/18/2016 7:29 PM

## 2016-10-17 ENCOUNTER — Ambulatory Visit: Payer: Medicare Other

## 2016-10-17 ENCOUNTER — Other Ambulatory Visit: Payer: Self-pay | Admitting: Family Medicine

## 2016-10-24 ENCOUNTER — Ambulatory Visit: Payer: Medicare Other

## 2016-11-04 ENCOUNTER — Other Ambulatory Visit: Payer: Self-pay

## 2016-11-04 MED ORDER — ATORVASTATIN CALCIUM 40 MG PO TABS: 40.0000 mg | ORAL_TABLET | Freq: Every day | ORAL | 1 refills | Status: DC

## 2016-11-26 ENCOUNTER — Other Ambulatory Visit: Payer: Self-pay | Admitting: Family Medicine

## 2016-12-11 ENCOUNTER — Other Ambulatory Visit: Payer: Self-pay | Admitting: Family Medicine

## 2016-12-11 ENCOUNTER — Other Ambulatory Visit: Payer: Self-pay | Admitting: Cardiovascular Disease

## 2016-12-17 ENCOUNTER — Other Ambulatory Visit: Payer: Self-pay

## 2016-12-17 MED ORDER — RALOXIFENE HCL 60 MG PO TABS: 60.0000 mg | ORAL_TABLET | Freq: Every day | ORAL | 3 refills | Status: DC

## 2016-12-23 ENCOUNTER — Ambulatory Visit (INDEPENDENT_AMBULATORY_CARE_PROVIDER_SITE_OTHER): Payer: Medicare Other

## 2016-12-23 VITALS — BP 130/68 | HR 90 | Temp 98.0°F | Ht 64.25 in | Wt 207.0 lb

## 2016-12-23 DIAGNOSIS — Z0000 Encounter for general adult medical examination without abnormal findings: Secondary | ICD-10-CM | POA: Diagnosis not present

## 2016-12-23 DIAGNOSIS — N183 Chronic kidney disease, stage 3 unspecified: Secondary | ICD-10-CM

## 2016-12-23 DIAGNOSIS — E785 Hyperlipidemia, unspecified: Secondary | ICD-10-CM | POA: Diagnosis not present

## 2016-12-23 DIAGNOSIS — I10 Essential (primary) hypertension: Secondary | ICD-10-CM | POA: Diagnosis not present

## 2016-12-23 DIAGNOSIS — Z1211 Encounter for screening for malignant neoplasm of colon: Secondary | ICD-10-CM

## 2016-12-23 NOTE — Progress Notes (Signed)
Subjective:   ROSAISELA JAMROZ is a 66 y.o. female who presents for Medicare Annual (Subsequent) preventive examination.  Review of Systems:  Cardiac Risk Factors include: advanced age (>43men, >29 women);dyslipidemia;hypertension;obesity (BMI >30kg/m2);sedentary lifestyle     Objective:     Vitals: BP 130/68   Pulse 90   Temp 98 F (36.7 C) (Oral)   Ht 5' 4.25" (1.632 m)   Wt 207 lb 0.6 oz (93.9 kg)   SpO2 98%   BMI 35.26 kg/m   Body mass index is 35.26 kg/m.   Tobacco History  Smoking Status  . Former Smoker  . Packs/day: 1.00  . Years: 20.00  . Types: Cigarettes  . Quit date: 12/24/2003  Smokeless Tobacco  . Never Used     Counseling given: Not Answered   Past Medical History:  Diagnosis Date  . Allergic rhinitis   . Angina at rest [facility])   . Anxiety   . Aortic sclerosis   . Chronic back pain   . Chronic constipation   . CKD (chronic kidney disease)   . Depression   . Fibromyalgia   . GERD (gastroesophageal reflux disease)   . Hyperlipidemia   . Hypertension   . Hypertension   . Mitral regurgitation   . Palpitations   . Thyroid disease   . Tricuspid regurgitation    Past Surgical History:  Procedure Laterality Date  . BACK SURGERY  2005  . BREAST LUMPECTOMY  1970   right   . CATARACT EXTRACTION W/PHACO Right 06/15/2015   Procedure: CATARACT EXTRACTION PHACO AND INTRAOCULAR LENS PLACEMENT (IOC);  Surgeon: Tonny Branch, MD;  Location: [facility];  Service: Ophthalmology;  Laterality: Right;  CDE: 10.33  . CATARACT EXTRACTION W/PHACO Left 07/17/2015   Procedure: CATARACT EXTRACTION PHACO AND INTRAOCULAR LENS PLACEMENT (IOC);  Surgeon: Tonny Branch, MD;  Location: [facility];  Service: Ophthalmology;  Laterality: Left;  CDE:7.60  . NM MYOCAR PERF WALL MOTION  02/16/2009   normal  . ORIF ANKLE FRACTURE Left 10/26/2014   Procedure: OPEN REDUCTION INTERNAL FIXATION (ORIF) ANKLE FRACTURE;  Surgeon: Sanjuana Kava, MD;  Location: [facility];  Service: Orthopedics;   Laterality: Left;  . TONSILLECTOMY  childhood   . TUBAL LIGATION  1973  . US ECHOCARDIOGRAPHY  12/19/2008   mild mitral annular ca+,AOV mildly sclerotic,mild MR & TR   Family History  Problem Relation Age of Onset  . Rheum arthritis Mother   . Osteoporosis Mother   . Heart disease Father   . Diabetes Father   . Hyperlipidemia Father   . Bladder Cancer Father   . Heart disease Brother   . Arthritis Brother    History  Sexual Activity  . Sexual activity: Not Currently  . Birth control/ protection: Surgical    Outpatient Encounter Prescriptions as of 12/23/2016  Medication Sig  . atorvastatin (LIPITOR) 40 MG tablet Take 1 tablet (40 mg total) by mouth daily.  . Calcium-Vitamin D 600-125 MG-UNIT TABS Take 1 tablet by mouth daily.   Marland Kitchen CARTIA XT 240 MG 24 hr capsule TAKE ONE CAPSULE BY MOUTH ONCE DAILY  . Cholecalciferol (VITAMIN D) 1000 UNITS capsule Take 1,000 Units by mouth 2 (two) times daily.   . DULoxetine (CYMBALTA) 60 MG capsule Take 120 mg by mouth daily.   . fenofibrate 160 MG tablet Take 1 tablet (160 mg total) by mouth daily.  Marland Kitchen gabapentin (NEURONTIN) 800 MG tablet TAKE ONE TABLET BY MOUTH TWICE DAILY  . levocetirizine (XYZAL) 5 MG tablet TAKE ONE  TABLET BY MOUTH ONCE DAILY  . levothyroxine (SYNTHROID, LEVOTHROID) 50 MCG tablet Take 1 tablet (50 mcg total) by mouth daily.  . metoprolol succinate (TOPROL-XL) 100 MG 24 hr tablet Take 1 tablet in the morning and 1/2 tablet in the evening.  . Multiple Vitamins-Iron (QC DAILY MULTIVITAMINS/IRON PO) Take 1 tablet by mouth once a week.   . omega-3 acid ethyl esters (LOVAZA) 1 g capsule TAKE TWO CAPSULES BY MOUTH TWICE DAILY  . pantoprazole (PROTONIX) 40 MG tablet TAKE ONE TABLET BY MOUTH ONCE DAILY  . raloxifene (EVISTA) 60 MG tablet Take 1 tablet (60 mg total) by mouth daily.  . sennosides-docusate sodium (SENOKOT-S) 8.6-50 MG tablet Take 1 tablet by mouth at bedtime.   . [DISCONTINUED] raloxifene (EVISTA) 60 MG tablet TAKE ONE  TABLET BY MOUTH ONCE DAILY  . [DISCONTINUED] metoprolol succinate (TOPROL-XL) 100 MG 24 hr tablet TAKE ONE TABLET BY MOUTH ONCE DAILY   No facility-administered encounter medications on file as of 12/23/2016.     Activities of Daily Living In your present state of health, do you have any difficulty performing the following activities: 12/23/2016  Hearing? N  Vision? N  Difficulty concentrating or making decisions? N  Walking or climbing stairs? Y  Dressing or bathing? N  Doing errands, shopping? Y  Preparing Food and eating ? Y  Using the Toilet? N  In the past six months, have you accidently leaked urine? N  Do you have problems with loss of bowel control? N  Managing your Medications? N  Managing your Finances? N  Housekeeping or managing your Housekeeping? Y  Some recent data might be hidden    Patient Care Team: Fayrene Helper, MD as PCP - General Rehman, Mechele Dawley, MD as Consulting Physician (Gastroenterology) Jonnie Kind, MD as Consulting Physician (Obstetrics and Gynecology) Renato Shin, MD as Consulting Physician (Endocrinology) Troy Sine, MD as Consulting Physician (Cardiology) Fran Lowes, MD as Consulting Physician (Nephrology) Netta Cedars, MD as Consulting Physician (Orthopedic Surgery)    Assessment:    Exercise Activities and Dietary recommendations Current Exercise Habits: The patient does not participate in regular exercise at present, Exercise limited by: orthopedic condition(s)  Goals    . Exercise 3x per week (30 min per time)          Recommend starting a routine exercise program at least 3 days a week for 30-45 minutes at a time as tolerated.        Fall Risk Fall Risk  12/23/2016 01/09/2016 10/16/2015 07/07/2014  Falls in the past year? No No Yes No  Number falls in past yr: - - 1 -  Injury with Fall? - - Yes -  Risk Factor Category  - - High Fall Risk -  Risk for fall due to : - Impaired balance/gait - -   Depression  Screen PHQ 2/9 Scores 12/23/2016 01/09/2016 07/07/2014 03/08/2014  PHQ - 2 Score 6 1 3 2   PHQ- 9 Score 19 5 - 11     Cognitive Function: Normal   6CIT Screen 12/23/2016  What Year? 0 points  What month? 0 points  What time? 0 points  Count back from 20 0 points  Months in reverse 0 points  Repeat phrase 0 points  Total Score 0    Immunization History  Administered Date(s) Administered  . Influenza Whole 05/02/2010  . Influenza,inj,Quad PF,36+ Mos 07/07/2014, 05/16/2015, 06/12/2016  . Pneumococcal Conjugate-13 01/09/2015  . Pneumococcal Polysaccharide-23 08/19/1998, 02/07/2011, 06/12/2016  . Td 08/07/2010  .  Zoster 02/15/2011   Screening Tests Health Maintenance  Topic Date Due  . COLONOSCOPY  09/19/2013  . INFLUENZA VACCINE  03/19/2017  . MAMMOGRAM  07/03/2018  . TETANUS/TDAP  08/07/2020  . DEXA SCAN  Completed  . Hepatitis C Screening  Completed  . PNA vac Low Risk Adult  Completed      Plan:     I have personally reviewed and noted the following in the patient's chart:   . Medical and social history . Use of alcohol, tobacco or illicit drugs  . Current medications and supplements . Functional ability and status . Nutritional status . Physical activity . Advanced directives . List of other physicians . Hospitalizations, surgeries, and ER visits in previous 12 months . Vitals . Screenings to include cognitive, depression, and falls . Referrals and appointments: Referral to Dr. Laural Golden sent today. Community resource referral sent today to assist patient with food, medications, transportation to appointments outside of [place], and cleaning.  In addition, I have reviewed and discussed with patient certain preventive protocols, quality metrics, and best practice recommendations. A written personalized care plan for preventive services as well as general preventive health recommendations were provided to patient.     Stormy Fabian, LPN  03/25/5796

## 2016-12-23 NOTE — Patient Instructions (Addendum)
Regina Horton , Thank you for taking time to come for your Medicare Wellness Visit. I appreciate your ongoing commitment to your health goals. Please review the following plan we discussed and let me know if I can assist you in the future.   Screening recommendations/referrals: Community resource referral sent in today to help you find assistance with paying for your medications, food, transportation, and cleaning your home. You should receive a call from our care guide Elmyra Ricks within the next 2-4 weeks. Colonoscopy: Due, referral to Dr. Laural Golden sent today Mammogram: Up to date, next due 07/2017 Bone Density: Up to date Recommended yearly ophthalmology/optometry visit for glaucoma screening and checkup Recommended yearly dental visit for hygiene and checkup  Vaccinations: Influenza vaccine: Up to date, next due 03/2017  Pneumococcal vaccine: Up to date Tdap vaccine: Up to date, next due 07/2020 Shingles vaccine: Done    Advanced directives: Advance directive discussed with you today. I have provided a copy for you to complete at home and have notarized. Once this is complete please bring a copy in to our office so we can scan it into your chart.  Conditions/risks identified: Pre-obese, recommend starting a routine exercise program at least 3 days a week for 30-45 minutes at a time as tolerated.   Next appointment: Follow up with Dr. Moshe Cipro in 1 week to follow up for depression. Follow up with Dr. Moshe Cipro in early September. Follow up in 1 year for your annual wellness visit  Preventive Care 65 Years and Older, Female Preventive care refers to lifestyle choices and visits with your health care provider that can promote health and wellness. What does preventive care include?  A yearly physical exam. This is also called an annual well check.  Dental exams once or twice a year.  Routine eye exams. Ask your health care provider how often you should have your eyes checked.  Personal lifestyle  choices, including:  Daily care of your teeth and gums.  Regular physical activity.  Eating a healthy diet.  Avoiding tobacco and drug use.  Limiting alcohol use.  Practicing safe sex.  Taking low-dose aspirin every day.  Taking vitamin and mineral supplements as recommended by your health care provider. What happens during an annual well check? The services and screenings done by your health care provider during your annual well check will depend on your age, overall health, lifestyle risk factors, and family history of disease. Counseling  Your health care provider may ask you questions about your:  Alcohol use.  Tobacco use.  Drug use.  Emotional well-being.  Home and relationship well-being.  Sexual activity.  Eating habits.  History of falls.  Memory and ability to understand (cognition).  Work and work Statistician.  Reproductive health. Screening  You may have the following tests or measurements:  Height, weight, and BMI.  Blood pressure.  Lipid and cholesterol levels. These may be checked every 5 years, or more frequently if you are over 42 years old.  Skin check.  Lung cancer screening. You may have this screening every year starting at age 50 if you have a 30-pack-year history of smoking and currently smoke or have quit within the past 15 years.  Fecal occult blood test (FOBT) of the stool. You may have this test every year starting at age 52.  Flexible sigmoidoscopy or colonoscopy. You may have a sigmoidoscopy every 5 years or a colonoscopy every 10 years starting at age 37.  Hepatitis C blood test.  Hepatitis B blood test.  Sexually transmitted disease (STD) testing.  Diabetes screening. This is done by checking your blood sugar (glucose) after you have not eaten for a while (fasting). You may have this done every 1-3 years.  Bone density scan. This is done to screen for osteoporosis. You may have this done starting at age  12.  Mammogram. This may be done every 1-2 years. Talk to your health care provider about how often you should have regular mammograms. Talk with your health care provider about your test results, treatment options, and if necessary, the need for more tests. Vaccines  Your health care provider may recommend certain vaccines, such as:  Influenza vaccine. This is recommended every year.  Tetanus, diphtheria, and acellular pertussis (Tdap, Td) vaccine. You may need a Td booster every 10 years.  Zoster vaccine. You may need this after age 17.  Pneumococcal 13-valent conjugate (PCV13) vaccine. One dose is recommended after age 30.  Pneumococcal polysaccharide (PPSV23) vaccine. One dose is recommended after age 88. Talk to your health care provider about which screenings and vaccines you need and how often you need them. This information is not intended to replace advice given to you by your health care provider. Make sure you discuss any questions you have with your health care provider. Document Released: 09/01/2015 Document Revised: 04/24/2016 Document Reviewed: 06/06/2015 Elsevier Interactive Patient Education  2017 [place] Prevention in the Home Falls can cause injuries. They can happen to people of all ages. There are many things you can do to make your home safe and to help prevent falls. What can I do on the outside of my home?  Regularly fix the edges of walkways and driveways and fix any cracks.  Remove anything that might make you trip as you walk through a door, such as a raised step or threshold.  Trim any bushes or trees on the path to your home.  Use bright outdoor lighting.  Clear any walking paths of anything that might make someone trip, such as rocks or tools.  Regularly check to see if handrails are loose or broken. Make sure that both sides of any steps have handrails.  Any raised decks and porches should have guardrails on the edges.  Have any  leaves, snow, or ice cleared regularly.  Use sand or salt on walking paths during winter.  Clean up any spills in your garage right away. This includes oil or grease spills. What can I do in the bathroom?  Use night lights.  Install grab bars by the toilet and in the tub and shower. Do not use towel bars as grab bars.  Use non-skid mats or decals in the tub or shower.  If you need to sit down in the shower, use a plastic, non-slip stool.  Keep the floor dry. Clean up any water that spills on the floor as soon as it happens.  Remove soap buildup in the tub or shower regularly.  Attach bath mats securely with double-sided non-slip rug tape.  Do not have throw rugs and other things on the floor that can make you trip. What can I do in the bedroom?  Use night lights.  Make sure that you have a light by your bed that is easy to reach.  Do not use any sheets or blankets that are too big for your bed. They should not hang down onto the floor.  Have a firm chair that has side arms. You can use this for support while you get  dressed.  Do not have throw rugs and other things on the floor that can make you trip. What can I do in the kitchen?  Clean up any spills right away.  Avoid walking on wet floors.  Keep items that you use a lot in easy-to-reach places.  If you need to reach something above you, use a strong step stool that has a grab bar.  Keep electrical cords out of the way.  Do not use floor polish or wax that makes floors slippery. If you must use wax, use non-skid floor wax.  Do not have throw rugs and other things on the floor that can make you trip. What can I do with my stairs?  Do not leave any items on the stairs.  Make sure that there are handrails on both sides of the stairs and use them. Fix handrails that are broken or loose. Make sure that handrails are as long as the stairways.  Check any carpeting to make sure that it is firmly attached to the stairs.  Fix any carpet that is loose or worn.  Avoid having throw rugs at the top or bottom of the stairs. If you do have throw rugs, attach them to the floor with carpet tape.  Make sure that you have a light switch at the top of the stairs and the bottom of the stairs. If you do not have them, ask someone to add them for you. What else can I do to help prevent falls?  Wear shoes that:  Do not have high heels.  Have rubber bottoms.  Are comfortable and fit you well.  Are closed at the toe. Do not wear sandals.  If you use a stepladder:  Make sure that it is fully opened. Do not climb a closed stepladder.  Make sure that both sides of the stepladder are locked into place.  Ask someone to hold it for you, if possible.  Clearly mark and make sure that you can see:  Any grab bars or handrails.  First and last steps.  Where the edge of each step is.  Use tools that help you move around (mobility aids) if they are needed. These include:  Canes.  Walkers.  Scooters.  Crutches.  Turn on the lights when you go into a dark area. Replace any light bulbs as soon as they burn out.  Set up your furniture so you have a clear path. Avoid moving your furniture around.  If any of your floors are uneven, fix them.  If there are any pets around you, be aware of where they are.  Review your medicines with your doctor. Some medicines can make you feel dizzy. This can increase your chance of falling. Ask your doctor what other things that you can do to help prevent falls. This information is not intended to replace advice given to you by your health care provider. Make sure you discuss any questions you have with your health care provider. Document Released: 06/01/2009 Document Revised: 01/11/2016 Document Reviewed: 09/09/2014 Elsevier Interactive Patient Education  2017 [employer].

## 2016-12-23 NOTE — Addendum Note (Signed)
Addended by: Stormy Fabian D on: 12/23/2016 04:41 PM   Modules accepted: Orders

## 2016-12-24 ENCOUNTER — Encounter (INDEPENDENT_AMBULATORY_CARE_PROVIDER_SITE_OTHER): Payer: Self-pay | Admitting: *Deleted

## 2017-01-01 ENCOUNTER — Other Ambulatory Visit: Payer: Self-pay | Admitting: Family Medicine

## 2017-01-01 ENCOUNTER — Encounter: Payer: Self-pay | Admitting: Family Medicine

## 2017-01-01 ENCOUNTER — Ambulatory Visit (INDEPENDENT_AMBULATORY_CARE_PROVIDER_SITE_OTHER): Payer: Medicare Other | Admitting: Family Medicine

## 2017-01-01 VITALS — BP 134/80 | HR 92 | Resp 16 | Ht 64.0 in | Wt 208.1 lb

## 2017-01-01 DIAGNOSIS — M5440 Lumbago with sciatica, unspecified side: Secondary | ICD-10-CM

## 2017-01-01 DIAGNOSIS — F419 Anxiety disorder, unspecified: Secondary | ICD-10-CM | POA: Diagnosis not present

## 2017-01-01 DIAGNOSIS — E785 Hyperlipidemia, unspecified: Secondary | ICD-10-CM

## 2017-01-01 DIAGNOSIS — I10 Essential (primary) hypertension: Secondary | ICD-10-CM | POA: Diagnosis not present

## 2017-01-01 DIAGNOSIS — N183 Chronic kidney disease, stage 3 (moderate): Secondary | ICD-10-CM | POA: Diagnosis not present

## 2017-01-01 DIAGNOSIS — M961 Postlaminectomy syndrome, not elsewhere classified: Secondary | ICD-10-CM

## 2017-01-01 DIAGNOSIS — R809 Proteinuria, unspecified: Secondary | ICD-10-CM | POA: Diagnosis not present

## 2017-01-01 DIAGNOSIS — E559 Vitamin D deficiency, unspecified: Secondary | ICD-10-CM | POA: Diagnosis not present

## 2017-01-01 DIAGNOSIS — F32A Depression, unspecified: Secondary | ICD-10-CM

## 2017-01-01 DIAGNOSIS — F329 Major depressive disorder, single episode, unspecified: Secondary | ICD-10-CM | POA: Diagnosis not present

## 2017-01-01 DIAGNOSIS — M62830 Muscle spasm of back: Secondary | ICD-10-CM | POA: Diagnosis not present

## 2017-01-01 DIAGNOSIS — D509 Iron deficiency anemia, unspecified: Secondary | ICD-10-CM | POA: Diagnosis not present

## 2017-01-01 DIAGNOSIS — E038 Other specified hypothyroidism: Secondary | ICD-10-CM | POA: Diagnosis not present

## 2017-01-01 DIAGNOSIS — Z79899 Other long term (current) drug therapy: Secondary | ICD-10-CM | POA: Diagnosis not present

## 2017-01-01 DIAGNOSIS — Z1159 Encounter for screening for other viral diseases: Secondary | ICD-10-CM

## 2017-01-01 DIAGNOSIS — G8929 Other chronic pain: Secondary | ICD-10-CM

## 2017-01-01 MED ORDER — LORAZEPAM 1 MG PO TABS: 1.0000 mg | ORAL_TABLET | Freq: Two times a day (BID) | ORAL | 2 refills | Status: DC | PRN

## 2017-01-01 MED ORDER — KETOROLAC TROMETHAMINE 60 MG/2ML IM SOLN
60.0000 mg | Freq: Once | INTRAMUSCULAR | Status: AC
  Administered 2017-01-01: 60 mg via INTRAMUSCULAR

## 2017-01-01 MED ORDER — METHYLPREDNISOLONE ACETATE 80 MG/ML IJ SUSP
80.0000 mg | Freq: Once | INTRAMUSCULAR | Status: AC
  Administered 2017-01-01: 80 mg via INTRAMUSCULAR

## 2017-01-01 NOTE — Patient Instructions (Addendum)
F/u in 3 months.   Lipid, cmp and EGFR, Hepatitis B titer today  You are referred to [facility] neurosurgery for opinion on lumbar surgery  Depomedrol injection iM in office today for pain  Ativan prescribed for bedtime use for spasm

## 2017-01-03 LAB — COMPREHENSIVE METABOLIC PANEL
A/G RATIO: 1.8 (ref 1.2–2.2)
ALBUMIN: 4.5 g/dL (ref 3.6–4.8)
ALT: 10 IU/L (ref 0–32)
AST: 18 IU/L (ref 0–40)
Alkaline Phosphatase: 46 IU/L (ref 39–117)
BUN/Creatinine Ratio: 14 (ref 12–28)
BUN: 18 mg/dL (ref 8–27)
Bilirubin Total: 0.5 mg/dL (ref 0.0–1.2)
CALCIUM: 10.2 mg/dL (ref 8.7–10.3)
CHLORIDE: 103 mmol/L (ref 96–106)
CO2: 23 mmol/L (ref 18–29)
Creatinine, Ser: 1.32 mg/dL — ABNORMAL HIGH (ref 0.57–1.00)
GFR, EST AFRICAN AMERICAN: 48 mL/min/{1.73_m2} — AB (ref >59)
GFR, EST NON AFRICAN AMERICAN: 42 mL/min/{1.73_m2} — AB (ref >59)
GLOBULIN, TOTAL: 2.5 g/dL (ref 1.5–4.5)
Glucose: 111 mg/dL — ABNORMAL HIGH (ref 65–99)
POTASSIUM: 4.7 mmol/L (ref 3.5–5.2)
SODIUM: 144 mmol/L (ref 134–144)
TOTAL PROTEIN: 7 g/dL (ref 6.0–8.5)

## 2017-01-03 LAB — LIPID PANEL W/O CHOL/HDL RATIO
Cholesterol, Total: 150 mg/dL (ref 100–199)
HDL: 57 mg/dL (ref >39)
LDL Calculated: 57 mg/dL (ref 0–99)
Triglycerides: 179 mg/dL — ABNORMAL HIGH (ref 0–149)
VLDL Cholesterol Cal: 36 mg/dL (ref 5–40)

## 2017-01-03 LAB — AMBIG ABBREV LP DEFAULT

## 2017-01-03 LAB — HEPATITIS B CORE ANTIBODY, IGM: HEP B C IGM: NEGATIVE

## 2017-01-03 LAB — AMBIG ABBREV BMP7 DEFAULT

## 2017-01-03 MED ORDER — LORAZEPAM 1 MG PO TABS: ORAL_TABLET | ORAL | 3 refills | Status: DC

## 2017-01-03 NOTE — Assessment & Plan Note (Signed)
Updated lab needed.  

## 2017-01-03 NOTE — Assessment & Plan Note (Signed)
Followed by endo.  

## 2017-01-03 NOTE — Assessment & Plan Note (Signed)
Uncontrolled, frustrated by poor quality of life from debility and chronic uncontrolled pain

## 2017-01-03 NOTE — Progress Notes (Signed)
   Regina Horton     MRN: [id number]      DOB: November 02, 1950   HPI Regina Horton is here tearful and stating that she feels as though her voice is not being heard as she is in pain all the time and no one will treat her. She is unable to do anything from back pain radiating down legs esp right leg, she had back surgery in 2005, the pain and debility are to a point that she feels that surgery  is her best and only remaining option and she is requesting a surgical consultation. She has had no recent imaging studies and she has impaired renal function She has been on narcotic pain medication for years but this has been withdrawn for over 5  months and is not really an option that she wants to reinstate in her mind at this time, hence I am referring her to a spine surgeon for evaluation Though she is very tearful, she states her depression is from poor level of function and frustration and anger over her chronic pain, poor quality of life and being unable to access the medical care that  She feels that she should have. She denies being actively suicidal or homicidal, bur states that she would rather "die on an operating table than to continue to live in the pain that she does" At one point she stated that she felt that I no longer wanted to be her Doctor , I told her that was not the case , but that if she no longer felt that I was providing the care that she needed , it would be better that she find another Provider. I also offered to refer her to another Pain clinic, bur she said that the outcome "would be the same" as her most recent return to Regina Horton who had been treating her pain for years She requested labs to be ordered this also was done, specifically mentioned hepatitis B testing as she was told by a family member that she may have been exposed in childhood to hepatitis B and that this needed to be tested ROS See HPI .   PE  BP 134/80   Pulse 92   Resp 16   Ht 5\' 4"  (1.626 m)   Wt 208 lb 1.9  oz (94.4 kg)   SpO2 95%   BMI 35.72 kg/m   Patient alert, very tearful.  HEENT: No facial asymmetry, EOMI,     Neck supple    MS: Decreased  ROM spine, shoulders, hips and knees.    Psych: Good eye contact, anxious tearful and  depressed appearing.  CNS: CN 2-12 intact,   Assessment & Plan  Bilateral low back pain with sciatica Uncontrolled and increased back pain, h/o spine surgery in remote past, 2005, requesting surgical consult for repeat surgery, multiple co morbidities, will refer to neurosurgeon for evaluation including most appropriate imaging study required  Depo medrol 80 mg iM in office Ativan 1mg   half tablet twice daily and 1 at bedtime x 3 month supply  Hypothyroidism Followed by endo  Essential hypertension controlled  Spasm of back muscles Ativan prescribed as above  Anxiety and depression Uncontrolled, frustrated by poor quality of life from debility and chronic uncontrolled pain  Hyperlipidemia LDL goal <100 Updated lab needed

## 2017-01-03 NOTE — Assessment & Plan Note (Signed)
controlled 

## 2017-01-03 NOTE — Assessment & Plan Note (Signed)
Ativan prescribed as above

## 2017-01-03 NOTE — Assessment & Plan Note (Addendum)
Uncontrolled and increased back pain, h/o spine surgery in remote past, 2005, requesting surgical consult for repeat surgery, multiple co morbidities, will refer to neurosurgeon for evaluation including most appropriate imaging study required  Depo medrol 80 mg iM in office also Toradol 60 mg IM Ativan 1mg   half tablet twice daily and 1 at bedtime x 3 month supply

## 2017-01-08 ENCOUNTER — Other Ambulatory Visit: Payer: Self-pay

## 2017-01-08 DIAGNOSIS — N250 Renal osteodystrophy: Secondary | ICD-10-CM | POA: Diagnosis not present

## 2017-01-08 DIAGNOSIS — N183 Chronic kidney disease, stage 3 (moderate): Secondary | ICD-10-CM | POA: Diagnosis not present

## 2017-01-08 DIAGNOSIS — I509 Heart failure, unspecified: Secondary | ICD-10-CM | POA: Diagnosis not present

## 2017-01-08 NOTE — Patient Outreach (Signed)
[facility]) Care Management  01/08/2017  AMBERLY LIVAS 1951/06/06 [id number]   Telephone call to patient for referral for medication assistance.  No answer.  HIPAA compliant voice message left.   Plan: RN Health Coach will attempt patient again in the next ten business days.    Jone Baseman, RN, MSN [place] [phone]

## 2017-01-09 ENCOUNTER — Other Ambulatory Visit: Payer: Self-pay

## 2017-01-09 NOTE — Patient Outreach (Signed)
[facility] [facility]) Care Management  01/09/2017  Regina Horton [birth year] [id number]  2nd Telephone call to patient for MD screening referral.  No answer.  HIPAA compliant voice message left.  Plan: RN Health Coach will attempt patient again within 10 business days.    Jone Baseman, RN, MSN [place] [phone]

## 2017-01-10 ENCOUNTER — Other Ambulatory Visit: Payer: Self-pay

## 2017-01-10 NOTE — Patient Outreach (Signed)
[facility]) Care Management  01/10/2017  ASHELYN MCCRAVY Mar 15, 1951 [id number]   3rd telephone call to patient for screening.  No answer.  HIPAA compliant voice message left.  Plan: RN Health Coach will send letter to attempt outreach.  If no response within 10 business days, will proceed with case closure.    Jone Baseman, RN, MSN [place] [phone]

## 2017-01-10 NOTE — Patient Outreach (Signed)
[facility]) Care Management  01/10/2017  Regina Horton 1951/05/18 [id number]   Referral Date: 01-01-17 Referral Source: MD Office Referral Reason: Medication assistance   Outreach Attempt: Fourth Attempt successful   Social: Patient lives with husband and son.  Patient states she stays in bed most of the time due to her back issues.  She states that her husband does the cooking, cleaning, and shopping.  Patient is independent with bathing and dressing.    Conditions: Patient admits to HTN, Kidney Disease, Irregular heart, and history of back issues.  Patient denies any problems.   Medications: Patient reports she is having problems affording Cymbalta , lovaza, and gabapentin.   Appointments: Patient say primary physician last week and kidney doctor this week.   Advanced Directives: Patient does not have an advanced directive.     Consent: Patient consents to [place] for assistance with medications.    Plan: RN Health Coach will refer to pharmacy for medication assistance.  RN Health Coach will sign off as no nursing needs identified.    Jone Baseman, RN, MSN [place] [phone]

## 2017-01-14 ENCOUNTER — Encounter: Payer: Self-pay | Admitting: Family Medicine

## 2017-01-15 ENCOUNTER — Other Ambulatory Visit: Payer: Self-pay | Admitting: Pharmacist

## 2017-01-15 NOTE — Patient Outreach (Signed)
[facility]) Care Management  01/15/2017  Regina Horton 1950-09-11 [id number]  66 y.o. year old female referred to [facility] for Medication Assistance (Pharmacy Telephone Outreach) Per consult patient is having difficulty affording duloxetine, Lovaza and gabapentin and these medications are costing her ~$250.00 per month.   Was unable to reach patient via telephone today and have left HIPAA compliant voicemail asking patient to return my call (unsuccessful outreach #1).  Plan: Will followup in 2-3 days via telephone  Bennye Alm, PharmD, [place] PGY2 Pharmacy Resident [phone]

## 2017-01-17 ENCOUNTER — Ambulatory Visit: Payer: Self-pay | Admitting: Pharmacist

## 2017-01-17 ENCOUNTER — Other Ambulatory Visit: Payer: Self-pay | Admitting: Pharmacist

## 2017-01-17 NOTE — Patient Outreach (Signed)
[facility]) Care Management  01/17/2017  KAZI MONTORO [birth year] [id number]  66 y.o. year old female referred to [place] for Medication Assistance (Pharmacy Telephone Outreach) Per consult patient is having difficulty affording duloxetine, Lovaza and gabapentin and these medications are costing her ~$250.00 per month.   Was unable to reach patient via telephone today and have left HIPAA compliant voicemail asking patient to return my call (unsuccessful outreach #2).  Plan: Will followup next week via telephone   Bennye Alm, PharmD, [place] PGY2 Pharmacy Resident [phone]

## 2017-01-21 ENCOUNTER — Other Ambulatory Visit: Payer: Self-pay | Admitting: Pharmacist

## 2017-01-21 ENCOUNTER — Encounter: Payer: Self-pay | Admitting: Pharmacist

## 2017-01-21 NOTE — Patient Outreach (Signed)
[place] [facility]) Care Management  01/21/2017  Regina Horton 07-20-1951 [id number]   Third unsuccessful phone-call attempt to patient today for medication assistance.  Left HIPAA compliant voicemail.  Will mail patient a [facility] letter.  If no contact from patient in 10 business days, pharmacy will close case.    Ralene Bathe, PharmD, [place] [phone]

## 2017-02-04 ENCOUNTER — Other Ambulatory Visit: Payer: Self-pay | Admitting: Pharmacist

## 2017-02-04 NOTE — Patient Outreach (Signed)
[facility]) Care Management  02/04/2017  Regina Horton 1950-10-10 [id number]   66 y.o. year old female referred to [place] for Medication Assistance (Case Closure) Per consult patient is having difficulty affording duloxetine, Lovaza and gabapentin and these medications are costing her ~$250.00 per month.   Have been unable to reach patient after 3 outreach attempts via telephone and have sent unsuccessful outreach letter and allowed 10 days for patient to respond.   Plan: [place] will close case due to unsuccessful outreach. Please re consult if needed.   Bennye Alm, PharmD, [place] PGY2 Pharmacy Resident [phone]

## 2017-02-08 ENCOUNTER — Other Ambulatory Visit: Payer: Self-pay | Admitting: Family Medicine

## 2017-02-10 ENCOUNTER — Other Ambulatory Visit: Payer: Self-pay

## 2017-02-10 MED ORDER — DULOXETINE HCL 60 MG PO CPEP: 120.0000 mg | ORAL_CAPSULE | Freq: Every day | ORAL | 4 refills | Status: DC

## 2017-02-11 ENCOUNTER — Telehealth: Payer: Self-pay | Admitting: *Deleted

## 2017-02-11 ENCOUNTER — Other Ambulatory Visit: Payer: Self-pay

## 2017-02-11 MED ORDER — CYMBALTA 60 MG PO CPEP: 120.0000 mg | ORAL_CAPSULE | Freq: Every day | ORAL | 4 refills | Status: DC

## 2017-02-11 NOTE — Telephone Encounter (Signed)
Eddie called stating he went to the [facility] in [place] to pick up patient's medication and he got the wrong one he got a generic, per Eddie patient has to take the actual cymbalta patient cannot take a generic. Per Ludwig Clarks to please call in cymbalta to [facility] in [place] asap.

## 2017-02-23 IMAGING — CR DG CHEST 1V PORT
1 series · 1 of 1 positions shown · non-contrast
Comparison: 02/23/2004

CLINICAL DATA: Fell down steps last night, injury to left ankle.

EXAM:
PORTABLE CHEST - 1 VIEW

[portable]
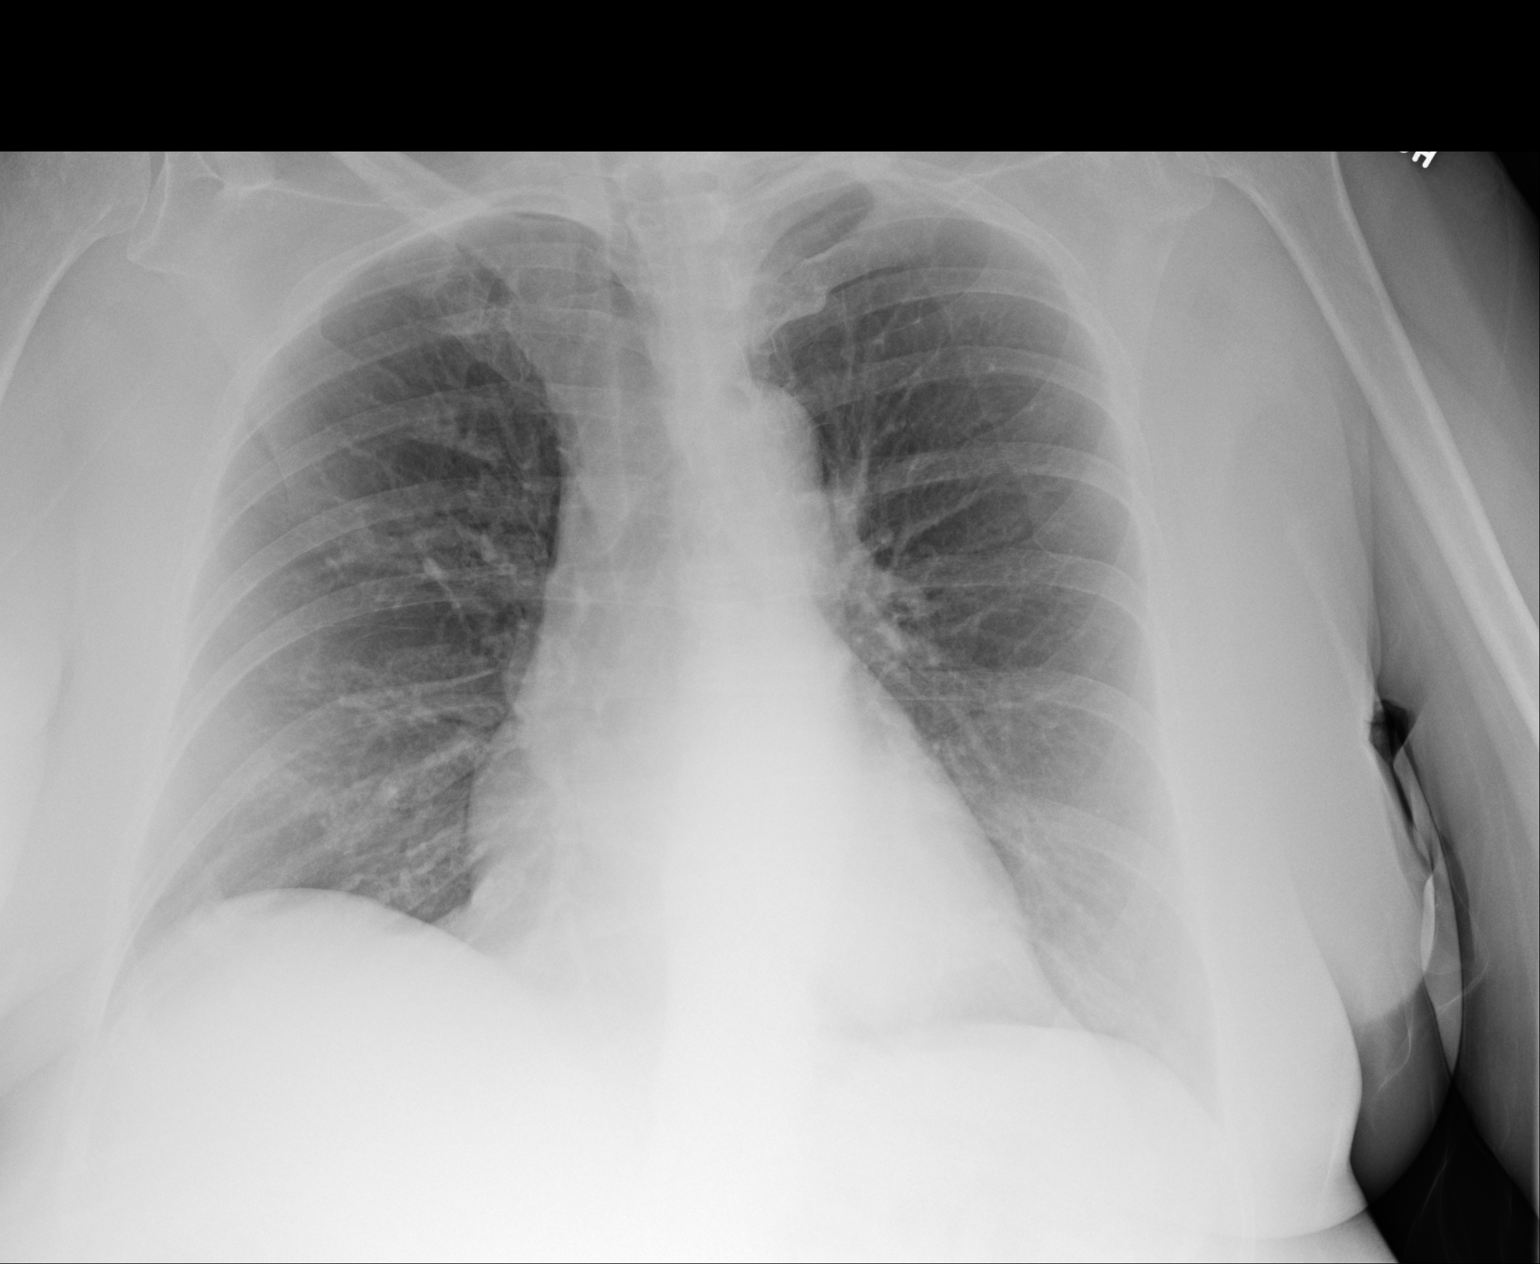

[1 of 1 positions shown; findings below may reference images not displayed]

FINDINGS: The heart size and mediastinal contours are within normal limits.
Both lungs are clear. The visualized skeletal structures are
unremarkable.
IMPRESSION: No active disease.

## 2017-02-24 IMAGING — RF DG C-ARM 61-120 MIN
1 series · 2 of 2 positions shown · non-contrast
Comparison: None.

CLINICAL DATA: Bimalleolar fracture.  ORIF.

EXAM:
DG C-ARM 61-120 MIN; LEFT ANKLE - 2 VIEW

[Series 1: run · 2 of 2 slices shown]
[im 1/2]
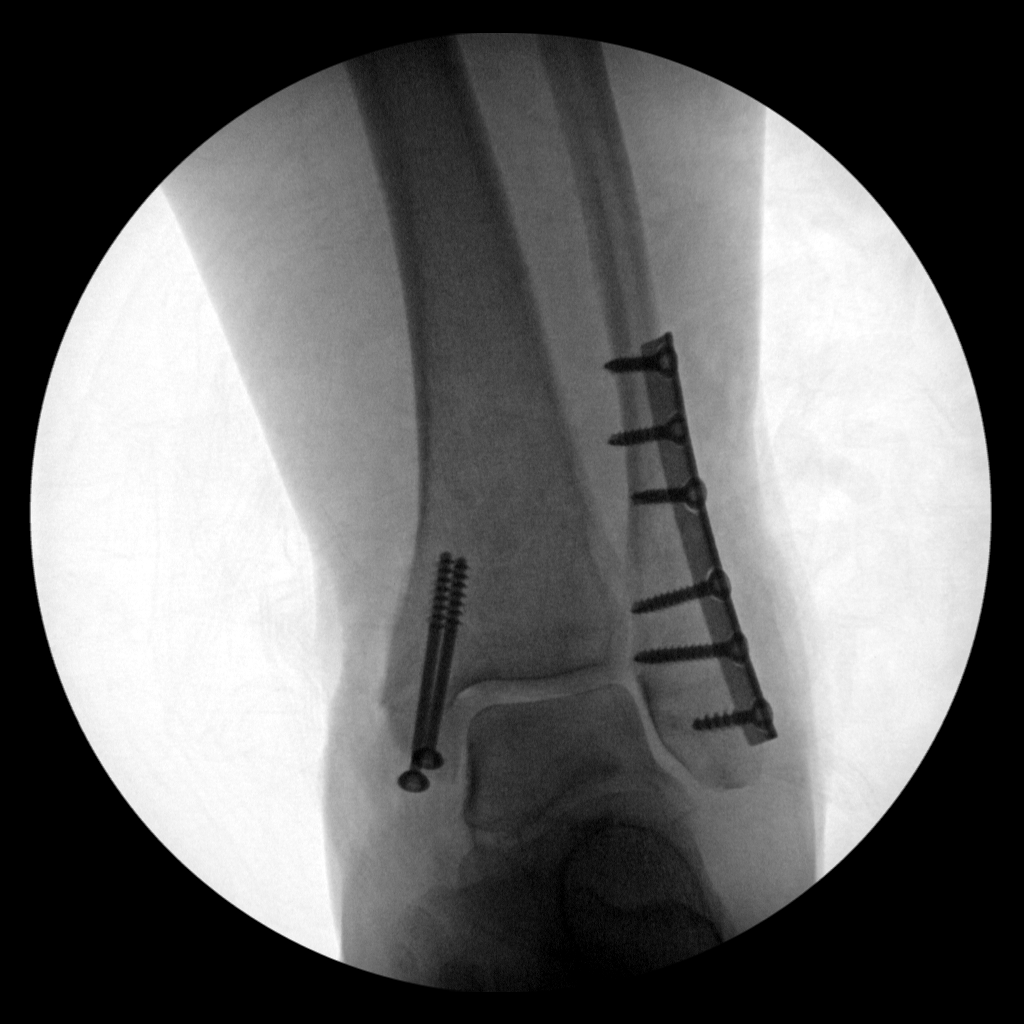
[im 2/2]
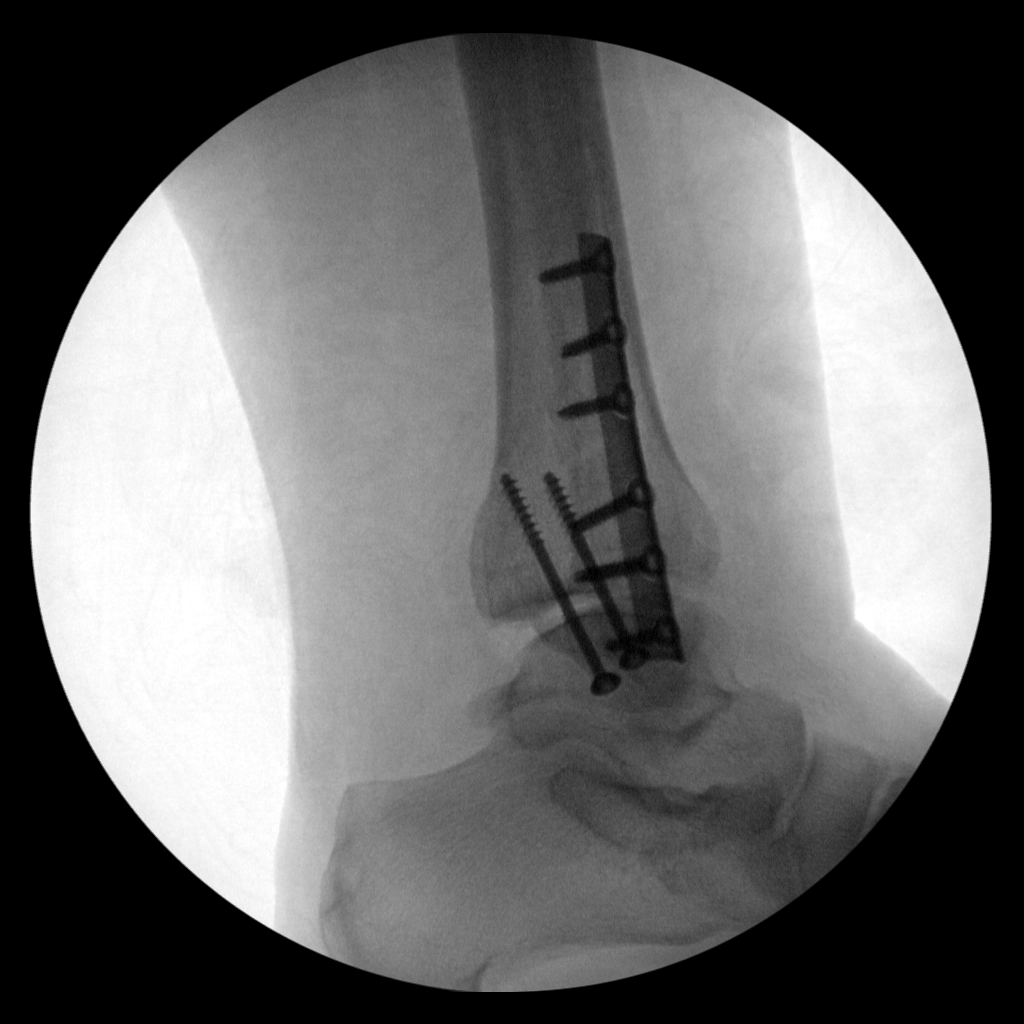

[2 of 2 positions shown; findings below may reference images not displayed]

FLUOROSCOPY TIME:  Radiation Exposure Index (as provided by the
fluoroscopic device):

If the device does not provide the exposure index:

Fluoroscopy Time:  20 seconds

Number of Acquired Images:  None
FINDINGS: Two intraoperative spot images demonstrate changes of Plate and
screw fixation in the distal fibula. Two screws are noted in the
medial malleolus and distal tibia. Anatomic alignment. No hardware
or bony complicating feature.
IMPRESSION: Left ankle Internal fixation as above.

## 2017-03-08 ENCOUNTER — Other Ambulatory Visit: Payer: Self-pay | Admitting: Family Medicine

## 2017-03-10 ENCOUNTER — Other Ambulatory Visit: Payer: Self-pay | Admitting: Family Medicine

## 2017-03-10 MED ORDER — LORAZEPAM 1 MG PO TABS: 1.0000 mg | ORAL_TABLET | Freq: Two times a day (BID) | ORAL | 0 refills | Status: DC | PRN

## 2017-03-10 NOTE — Telephone Encounter (Signed)
Seen [phone] 18

## 2017-04-03 ENCOUNTER — Ambulatory Visit: Payer: Medicare Other | Admitting: Family Medicine

## 2017-04-14 ENCOUNTER — Ambulatory Visit: Payer: Medicare Other | Admitting: Family Medicine

## 2017-04-14 ENCOUNTER — Telehealth: Payer: Self-pay | Admitting: Family Medicine

## 2017-04-14 ENCOUNTER — Other Ambulatory Visit: Payer: Self-pay | Admitting: Family Medicine

## 2017-04-14 MED ORDER — MECLIZINE HCL 32 MG PO TABS: 32.0000 mg | ORAL_TABLET | Freq: Three times a day (TID) | ORAL | 0 refills | Status: DC | PRN

## 2017-04-14 MED ORDER — CEPHALEXIN 500 MG PO CAPS: 500.0000 mg | ORAL_CAPSULE | Freq: Three times a day (TID) | ORAL | 9 refills | Status: DC

## 2017-04-14 NOTE — Telephone Encounter (Signed)
States whenever she gets up she feels like she is spinning. Has a bad tooth on the left side and its hurting as well. Thinks it may be related to her tooth but she is looking for a dentist to go to. Requesting antibiotic since she was unable to come to her appt. If she can't get that then she is asking for something for the dizziness. Please advise

## 2017-04-14 NOTE — Progress Notes (Unsigned)
Keflex

## 2017-04-14 NOTE — Telephone Encounter (Signed)
I sent keflex for 1 week to [facility], can send no refills she needs to see dentist, also sent antivert Hop[place] that helps, needs to reschedule [facility] she feels better soon

## 2017-04-14 NOTE — Telephone Encounter (Signed)
Patient left message on nurse line. She states she has an appointment today but is not sure she can make it. States her equilibrium is messed up. She thinks she has an ear infection, and that its coming from her tooth. She asks if Dr. Moshe Cipro will call in an antibiotic.  Callback# [phone]

## 2017-04-14 NOTE — Telephone Encounter (Signed)
Patient aware.

## 2017-04-20 ENCOUNTER — Encounter: Payer: Self-pay | Admitting: Family Medicine

## 2017-04-24 ENCOUNTER — Other Ambulatory Visit: Payer: Self-pay | Admitting: Family Medicine

## 2017-04-24 NOTE — Telephone Encounter (Signed)
Seen [phone] 18

## 2017-05-05 ENCOUNTER — Ambulatory Visit (INDEPENDENT_AMBULATORY_CARE_PROVIDER_SITE_OTHER): Payer: Medicare Other | Admitting: Family Medicine

## 2017-05-05 ENCOUNTER — Encounter: Payer: Self-pay | Admitting: Family Medicine

## 2017-05-05 VITALS — BP 146/78 | HR 84 | Temp 96.8°F | Resp 18 | Ht 64.0 in | Wt 199.1 lb

## 2017-05-05 DIAGNOSIS — G8929 Other chronic pain: Secondary | ICD-10-CM

## 2017-05-05 DIAGNOSIS — N183 Chronic kidney disease, stage 3 unspecified: Secondary | ICD-10-CM

## 2017-05-05 DIAGNOSIS — M5440 Lumbago with sciatica, unspecified side: Secondary | ICD-10-CM | POA: Diagnosis not present

## 2017-05-05 DIAGNOSIS — I10 Essential (primary) hypertension: Secondary | ICD-10-CM | POA: Diagnosis not present

## 2017-05-05 DIAGNOSIS — E038 Other specified hypothyroidism: Secondary | ICD-10-CM

## 2017-05-05 MED ORDER — FLUTICASONE PROPIONATE 50 MCG/ACT NA SUSP: 2.0000 | Freq: Every day | NASAL | 6 refills | Status: DC

## 2017-05-05 NOTE — Progress Notes (Signed)
Regina Horton     MRN: [id number]      DOB: 1950/09/18   HPI Regina Horton is here  Today stating that  Needs a referral to Dr Ellene Route for evaluation  For possible spine surgery, she states  That she has an appointment   date for early November. She is also requesting an MRI of her entire spine and that  all of the records from Dr Nelva Bush' office  Be sent so that Dr Ellene Route is aware of specifically where she has had epidural injections in the past. She will need to sign to have the records sent from Dr Nelva Bush' office to Dr Clarice Pole  office. She ,States that she is incapable of driving/ being driven to [facility] to go to Dr Nelva Bush' office to sign for the release of her records because of her chronic back pain     I was planning    to make arrangements to have her sign a release form from this office and fax to Dr Nelva Bush' office, however , unfortunately, the visit was abruptly terminated as Regina. Husky became increasingly tearful and agitated and feels that no one understands, cares or believes that she is in unbearable pain and can or is wiiling to help her, including or maybe even especially me. She is the mother of 2 children , and for the first time, her son was present at the visit , which is good thing. Regina Horton stated that she had told me at the last  Visit that she would rather die on the operating table than to  Continue to live with the chronic pain that  she has. She has not been interested in going to another Pain specialist,  but with encouragement from her son at this visit , she is agreeing to see Dr Merlene Laughter who is local and who I will refer her to. She specifically asked me what do I think about THC  Oil for pain. My response was that I can make NO recommendation, and that she should discuss this with the Doctor who will treat her pain, at which point she got up and left the room and said that coming to the office to see me made her feel like"SHIT" Her son who witnessed this all apologized and I  said to him, that IF  I could, I would , I would help his Mother , but obviously I CANNOT, so she will be terminated from the practice . Earlier in the visit , sh had reported falling 3 times greenly and had attributed this to uncontrolled sinus symptoms and left ear pressure. She requested  allergy nasal spray which I prescribed in the past for her After she walked out of the office she banged on the door to return demanding tat I examine her ear which I did, she also said that she was sorry for her behavior. Flu vaccine was offered , her response was "I don't care"  ROS See HPI .   PE  BP (!) 146/78 (BP Location: Left Arm, Patient Position: Sitting, Cuff Size: Normal)   Pulse 84   Temp (!) 96.8 F (36 C) (Temporal)   Resp 18   Ht 5' 4" (1.626 m)   Wt 199 lb 1.9 oz (90.3 kg)   SpO2 95%   BMI 34.18 kg/m   Patient alert and oriented and in no cardiopulmonary distress.  HEENT: No facial asymmetry, EOMI,   oropharynx pink and moist.  Neck decreased ROM, TM clear bilaterally, good  light reflex, no erythema, no cervical adenopathy  Chest: Clear to auscultation bilaterally.  CVS: S1, S2 no murmurs, no S3.Regular rate.  ABD: Soft non tender.   Ext: No edema  Regina: Decreased ROM thoracolumbar spine, ambulates with a walker  Skin: Intact, no ulcerations or rash noted.  Psych: Good eye contact,tearful,  anxious and  depressed appearing.  CNS: CN 2-12 intact,   Assessment & Plan  Essential hypertension UnControlled, no change in medication, pt anxious,  F/u wuith cardiology past due per his January visit so will refer and needs ongoing care, will send in 1 month med to local pharmacy  Bilateral low back pain with sciatica Pt with debilitating umbar pain and sciatica who is requesting surgical intervention due to poor quality of  life from uncontrolled pain and inability to function   Kidney disease, chronic, stage III (moderate, EGFR 30-59 ml/min) Followed by  nephrology  Hypothyroidism Followed by endo has appt in January   

## 2017-05-05 NOTE — Patient Instructions (Addendum)
  You are referred  To Dr Wendi Maya, and you need to follow up with  Dr Dow Adolph may sign letter  To get records sentI

## 2017-05-06 ENCOUNTER — Other Ambulatory Visit: Payer: Self-pay | Admitting: Family Medicine

## 2017-05-07 ENCOUNTER — Telehealth: Payer: Self-pay | Admitting: Family Medicine

## 2017-05-07 ENCOUNTER — Other Ambulatory Visit: Payer: Self-pay | Admitting: Family Medicine

## 2017-05-07 MED ORDER — METOPROLOL SUCCINATE ER 100 MG PO TB24: ORAL_TABLET | ORAL | 0 refills | Status: DC

## 2017-05-07 NOTE — Assessment & Plan Note (Signed)
Followed by nephrology. 

## 2017-05-07 NOTE — Telephone Encounter (Signed)
Dr Ellene Route, I am asking for your help with this patient . She advised me 2 days ago at a visit that she has an appointment with you in November and that an imaging study is needed.She does have CKD, and has had spine surgery in the past. I would like to know what study you need ordered . I will be back in the office the first week in October and will follow up on this at that time Thank you in advance

## 2017-05-07 NOTE — Assessment & Plan Note (Signed)
Followed by endo has appt in January

## 2017-05-07 NOTE — Assessment & Plan Note (Signed)
Pt with debilitating umbar pain and sciatica who is requesting surgical intervention due to poor quality of  life from uncontrolled pain and inability to function

## 2017-05-07 NOTE — Assessment & Plan Note (Addendum)
UnControlled, no change in medication, pt anxious,  F/u wuith cardiology past due per his January visit so will refer and needs ongoing care, will send in 1 month med to local pharmacy

## 2017-05-15 ENCOUNTER — Other Ambulatory Visit: Payer: Self-pay | Admitting: Family Medicine

## 2017-05-17 ENCOUNTER — Other Ambulatory Visit: Payer: Self-pay | Admitting: Family Medicine

## 2017-05-17 ENCOUNTER — Encounter: Payer: Self-pay | Admitting: Family Medicine

## 2017-05-19 ENCOUNTER — Telehealth: Payer: Self-pay | Admitting: Family Medicine

## 2017-05-19 ENCOUNTER — Other Ambulatory Visit: Payer: Self-pay | Admitting: Family Medicine

## 2017-05-19 DIAGNOSIS — D509 Iron deficiency anemia, unspecified: Secondary | ICD-10-CM | POA: Diagnosis not present

## 2017-05-19 DIAGNOSIS — I10 Essential (primary) hypertension: Secondary | ICD-10-CM | POA: Diagnosis not present

## 2017-05-19 DIAGNOSIS — E559 Vitamin D deficiency, unspecified: Secondary | ICD-10-CM | POA: Diagnosis not present

## 2017-05-19 DIAGNOSIS — N183 Chronic kidney disease, stage 3 (moderate): Secondary | ICD-10-CM | POA: Diagnosis not present

## 2017-05-19 DIAGNOSIS — R809 Proteinuria, unspecified: Secondary | ICD-10-CM | POA: Diagnosis not present

## 2017-05-19 DIAGNOSIS — Z79899 Other long term (current) drug therapy: Secondary | ICD-10-CM | POA: Diagnosis not present

## 2017-05-19 NOTE — Telephone Encounter (Signed)
Patient is asking about the MRI that Dr. Moshe Cipro was going to have ordered prior  Have you heard anything from Dr. Ellene Route?  Patient has an appt with him on November 14th .  Dr Ellene Route is with [facility].  Please note there have been 2 referrals placed in the [facility].  [facility] was with [facility].  [facility] is not affiliated with [facility] and their headquarters are in [place]. Not sure if this has caused any confusion.   Dr. Earleen Newport w/[facility]  Phone  [phone] Fax  [phone]

## 2017-05-20 NOTE — Telephone Encounter (Signed)
I called Dr Ellene Route office and talked with the intake person and she said Ms Reitter already has appt for 07/02/17 at 12:15 and a MRI is not required for this appt.  I called the Ms Yontz and told her what the office told me but we would be happy to order a Lumbar MRI which she has had in 2008 and 2006.  We can not be sure that is what Dr Ellene Route will want and she would have to take a chance of re-doing it if that is not what her needs.  She said she will just wait and see Dr Ellene Route and have him order whatever is needed at that time.  Nothing for [place] to order at this time per the patient and Dr Clarice Pole office.

## 2017-05-20 NOTE — Telephone Encounter (Signed)
Pleas help with this , I sent a message already asking exactly what imaging studies were desired , have not heard back , she does have chronic kidney doisease and also has ahd prior back surgery

## 2017-05-21 DIAGNOSIS — I10 Essential (primary) hypertension: Secondary | ICD-10-CM | POA: Diagnosis not present

## 2017-05-21 DIAGNOSIS — N250 Renal osteodystrophy: Secondary | ICD-10-CM | POA: Diagnosis not present

## 2017-05-21 DIAGNOSIS — I509 Heart failure, unspecified: Secondary | ICD-10-CM | POA: Diagnosis not present

## 2017-05-21 DIAGNOSIS — N183 Chronic kidney disease, stage 3 (moderate): Secondary | ICD-10-CM | POA: Diagnosis not present

## 2017-05-21 NOTE — Telephone Encounter (Signed)
Noted and appreciated greatly

## 2017-05-23 ENCOUNTER — Other Ambulatory Visit: Payer: Self-pay | Admitting: Family Medicine

## 2017-05-26 NOTE — Telephone Encounter (Signed)
Seen [phone] 

## 2017-05-27 ENCOUNTER — Ambulatory Visit: Payer: Medicare Other | Admitting: Cardiovascular Disease

## 2017-06-01 ENCOUNTER — Other Ambulatory Visit: Payer: Self-pay | Admitting: Family Medicine

## 2017-07-02 DIAGNOSIS — M48062 Spinal stenosis, lumbar region with neurogenic claudication: Secondary | ICD-10-CM | POA: Diagnosis not present

## 2017-07-16 ENCOUNTER — Other Ambulatory Visit (HOSPITAL_COMMUNITY): Payer: Self-pay | Admitting: Neurological Surgery

## 2017-07-16 ENCOUNTER — Ambulatory Visit: Payer: Medicare Other | Admitting: Cardiovascular Disease

## 2017-07-16 ENCOUNTER — Other Ambulatory Visit: Payer: Self-pay | Admitting: Neurological Surgery

## 2017-07-16 DIAGNOSIS — M47812 Spondylosis without myelopathy or radiculopathy, cervical region: Secondary | ICD-10-CM

## 2017-07-16 DIAGNOSIS — M48062 Spinal stenosis, lumbar region with neurogenic claudication: Secondary | ICD-10-CM

## 2017-07-17 ENCOUNTER — Ambulatory Visit (INDEPENDENT_AMBULATORY_CARE_PROVIDER_SITE_OTHER): Payer: Medicare Other | Admitting: Cardiovascular Disease

## 2017-07-17 ENCOUNTER — Encounter: Payer: Self-pay | Admitting: Cardiovascular Disease

## 2017-07-17 VITALS — BP 148/74 | HR 55 | Ht 64.0 in | Wt 200.6 lb

## 2017-07-17 DIAGNOSIS — K219 Gastro-esophageal reflux disease without esophagitis: Secondary | ICD-10-CM | POA: Diagnosis not present

## 2017-07-17 DIAGNOSIS — E669 Obesity, unspecified: Secondary | ICD-10-CM | POA: Diagnosis not present

## 2017-07-17 DIAGNOSIS — I10 Essential (primary) hypertension: Secondary | ICD-10-CM | POA: Diagnosis not present

## 2017-07-17 DIAGNOSIS — F32A Depression, unspecified: Secondary | ICD-10-CM

## 2017-07-17 DIAGNOSIS — N183 Chronic kidney disease, stage 3 unspecified: Secondary | ICD-10-CM

## 2017-07-17 DIAGNOSIS — E782 Mixed hyperlipidemia: Secondary | ICD-10-CM

## 2017-07-17 DIAGNOSIS — F419 Anxiety disorder, unspecified: Secondary | ICD-10-CM

## 2017-07-17 DIAGNOSIS — F329 Major depressive disorder, single episode, unspecified: Secondary | ICD-10-CM

## 2017-07-17 DIAGNOSIS — Z23 Encounter for immunization: Secondary | ICD-10-CM | POA: Diagnosis not present

## 2017-07-17 MED ORDER — HYDROCHLOROTHIAZIDE 12.5 MG PO CAPS: 12.5000 mg | ORAL_CAPSULE | ORAL | 3 refills | Status: DC

## 2017-07-17 NOTE — Patient Instructions (Signed)
Medication Instructions:  START HCTZ 12.5 mg every other day  Follow-Up: Your physician wants you to follow-up in: 6 months with Dr. Claiborne Billings. You will receive a reminder letter in the mail two months in advance. If you don't receive a letter, please call our office to schedule the follow-up appointment.   If you need a refill on your cardiac medications before your next appointment, please call your pharmacy.

## 2017-07-17 NOTE — Progress Notes (Signed)
Patient ID: Regina Horton, female   DOB: 1950/09/06, 66 y.o.   MRN: [id number]      Primary MD:  Dr. Tula Nakayama  HPI: Regina Horton is a 66 y.o. female who presents to the office today for a 10 month follow up cardiology evaluation.  Ms Troupe has a history of palpitations which have been controlled with beta blocker therapy, hypertension, mixed hyperlipidemia, obesity, mild renal insufficiency, and chronic low back pain.  An echo Doppler study in 2010  showed mild aortic sclerosis without stenosis, mild mitral and calcification with mild MR, and mild TR, and normal systolic function.  A nuclear perfusion study showed normal perfusion.  She has a significant history of depression and takes Cymbalta, both for depression, as well as for chronic low back pain.  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  She walks with a cane.  Her low back pain has been debilitating.  She is follow-up Dr. Birdie Sons for renal insufficiency. She has had issues with chronic pain in particular back pain.  She has seen Dr. Jeanell Sparrow most for pain management.  She recently stopped taking her pain medications due to concerns at the were adversely affecting her.  She continues to have issues with depression and cries often, because her back hurts.  She also has noticed some palpitations.    Since I last saw her in January 2018, she has continued to have low back issues.  She is been evaluated by Dr. Ellene Route.  She is scheduled have a myelogram on 08/07/2017.  She tells me she will most likely be establishing with a new primary care physician.  She does experience frequent ankle swelling almost daily and occasionally this is more pronounced.  She denies any episodes of chest pain, palpitations, presyncope or syncope.  On 05/19/2017.  Creatinine was 1.5.  Hemoglobin 13.6.  Total cholesterol was 150, HDL 57, LDL 57, and  triglycerides are mildly increased at 179.  She walks with a walker primarily due to knee discomfort.  She presents for reevaluation.  Past Medical History:  Diagnosis Date  . Allergic rhinitis   . Angina at rest [facility])   . Anxiety   . Aortic sclerosis   . Chronic back pain   . Chronic constipation   . CKD (chronic kidney disease)   . Depression   . Fibromyalgia   . GERD (gastroesophageal reflux disease)   . Hyperlipidemia   . Hypertension   . Hypertension   . Mitral regurgitation   . Palpitations   . Thyroid disease   . Tricuspid regurgitation     Past Surgical History:  Procedure Laterality Date  . BACK SURGERY  2005  . BREAST LUMPECTOMY  1970   right   . CATARACT EXTRACTION W/PHACO Right 06/15/2015   Procedure: CATARACT EXTRACTION PHACO AND INTRAOCULAR LENS PLACEMENT (IOC);  Surgeon: Tonny Branch, MD;  Location: [facility];  Service: Ophthalmology;  Laterality: Right;  CDE: 10.33  . CATARACT EXTRACTION W/PHACO Left 07/17/2015   Procedure: CATARACT EXTRACTION PHACO AND INTRAOCULAR LENS PLACEMENT (IOC);  Surgeon: Tonny Branch, MD;  Location: [facility];  Service: Ophthalmology;  Laterality: Left;  CDE:7.60  . NM MYOCAR PERF WALL MOTION  02/16/2009   normal  . ORIF ANKLE FRACTURE Left 10/26/2014   Procedure: OPEN REDUCTION INTERNAL FIXATION (ORIF) ANKLE FRACTURE;  Surgeon: Sanjuana Kava, MD;  Location: [facility];  Service: Orthopedics;  Laterality: Left;  .  TONSILLECTOMY  childhood   . TUBAL LIGATION  1973  . US ECHOCARDIOGRAPHY  12/19/2008   mild mitral annular ca+,AOV mildly sclerotic,mild MR & TR    Allergies  Allergen Reactions  . Xyzal [Levocetirizine Dihydrochloride] Hives and Itching  . Codeine     Pt states she does not have any problems with codeine.  . Nickel Rash    Current Outpatient Medications  Medication Sig Dispense Refill  . atorvastatin (LIPITOR) 40 MG tablet Take 1 tablet (40 mg total) by mouth daily. 90 tablet 1  . Calcium-Vitamin D 600-125 MG-UNIT TABS Take 1  tablet by mouth daily.     Marland Kitchen CARTIA XT 240 MG 24 hr capsule TAKE ONE CAPSULE BY MOUTH ONCE DAILY 90 capsule 2  . Cholecalciferol (VITAMIN D) 1000 UNITS capsule Take 1,000 Units by mouth 2 (two) times daily.     . CYMBALTA 60 MG capsule Take 2 capsules (120 mg total) by mouth daily. 60 capsule 4  . fenofibrate 160 MG tablet Take 1 tablet (160 mg total) by mouth daily. 90 tablet 3  . fluticasone (FLONASE) 50 MCG/ACT nasal spray Place 2 sprays into both nostrils daily. 16 g 6  . gabapentin (NEURONTIN) 800 MG tablet TAKE ONE TABLET BY MOUTH TWICE DAILY 180 tablet 1  . levothyroxine (SYNTHROID, LEVOTHROID) 50 MCG tablet Take 1 tablet (50 mcg total) by mouth daily. 90 tablet 3  . LORazepam (ATIVAN) 1 MG tablet TAKE 1 TABLET BY MOUTH TWICE DAILY AS NEEDED FOR ANXIETY 60 tablet 0  . metoprolol succinate (TOPROL-XL) 100 MG 24 hr tablet Take 1 tablet in the morning and 1/2 tablet in the evening. 135 tablet 3  . Multiple Vitamins-Iron (QC DAILY MULTIVITAMINS/IRON PO) Take 1 tablet by mouth once a week.     . omega-3 acid ethyl esters (LOVAZA) 1 g capsule TAKE TWO CAPSULES BY MOUTH TWICE DAILY 180 capsule 3  . pantoprazole (PROTONIX) 40 MG tablet TAKE ONE TABLET BY MOUTH ONCE DAILY 90 tablet 1  . raloxifene (EVISTA) 60 MG tablet Take 1 tablet (60 mg total) by mouth daily. 30 tablet 3  . hydrochlorothiazide (MICROZIDE) 12.5 MG capsule Take 1 capsule (12.5 mg total) by mouth every other day. 45 capsule 3   No current facility-administered medications for this visit.     Social History   Socioeconomic History  . Marital status: Married    Spouse name: Not on file  . Number of children: 2  . Years of education: Not on file  . Highest education level: Not on file  Social Needs  . Financial resource strain: Not on file  . Food insecurity - worry: Not on file  . Food insecurity - inability: Not on file  . Transportation needs - medical: Not on file  . Transportation needs - non-medical: Not on file    Occupational History  . Occupation: disabled   Tobacco Use  . Smoking status: Former Smoker    Packs/day: 1.00    Years: 20.00    Pack years: 20.00    Types: Cigarettes    Last attempt to quit: 12/24/2003    Years since quitting: 13.5  . Smokeless tobacco: Never Used  Substance and Sexual Activity  . Alcohol use: No  . Drug use: Yes    Types: Marijuana    Comment: boils marijuana in water and makes a tea about 3 times a week  . Sexual activity: Not Currently    Birth control/protection: Surgical  Other Topics Concern  . Not  on file  Social History Narrative  . Not on file    Family History  Problem Relation Age of Onset  . Rheum arthritis Mother   . Osteoporosis Mother   . Heart disease Father   . Diabetes Father   . Hyperlipidemia Father   . Bladder Cancer Father   . Heart disease Brother   . Arthritis Brother     ROS General: Negative; No fevers, chills, or night sweats HEENT: Negative; No changes in vision or hearing, sinus congestion, difficulty swallowing Pulmonary: Negative; No cough, wheezing, shortness of breath, hemoptysis Cardiovascular: See HPI: No chest pain, presyncope, syncope, palpitations Positive for occasional ankle swelling GI: Positive for GERD. No nausea, vomiting, diarrhea, or abdominal pain GU: Negative; No dysuria, hematuria, or difficulty voiding Musculoskeletal: Positive for chronic low back pain and knee discomfort Hematologic: Negative; no easy bruising, bleeding Endocrine: Positive for hypothyroidism.  No diabetes Neuro: Negative; no changes in balance, headaches Skin: Negative; No rashes or skin lesions Psychiatric: Positive for depression and anxiety Sleep: Negative; No snoring,  daytime sleepiness, hypersomnolence, bruxism, restless legs, hypnogognic hallucinations. Other comprehensive 14 point system review is negative   Physical Exam BP (!) 148/74   Pulse (!) 55   Ht 5' 4" (1.626 m)   Wt 200 lb 9.6 oz (91 kg)   BMI 34.43  kg/m    Repeat blood pressure by me was 130/76 supine and 140/76 standing..  Wt Readings from Last 3 Encounters:  07/17/17 200 lb 9.6 oz (91 kg)  05/05/17 199 lb 1.9 oz (90.3 kg)  01/01/17 208 lb 1.9 oz (94.4 kg)   General: Alert, oriented, no distress.  Skin: normal turgor, no rashes, warm and dry HEENT: Normocephalic, atraumatic. Pupils equal round and reactive to light; sclera anicteric; extraocular muscles intact;  Nose without nasal septal hypertrophy Mouth/Parynx benign; Mallinpatti scale 3 Neck: No JVD, no carotid bruits; normal carotid upstroke Lungs: clear to ausculatation and percussion; no wheezing or rales Chest wall: without tenderness to palpitation Heart: PMI not displaced, RRR, s1 s2 normal, 1/6 systolic murmur, no diastolic murmur, no rubs, gallops, thrills, or heaves Abdomen: soft, nontender; no hepatosplenomehaly, BS+; abdominal aorta nontender and not dilated by palpation. Back: no CVA tenderness Pulses 2+ Musculoskeletal: full range of motion, normal strength, no joint deformities Extremities: Trace bilateral ankle edema; no clubbing cyanosis, Homan's sign negative  Neurologic: grossly nonfocal; Cranial nerves grossly wnl Psychologic: Normal mood and affect   ECG (independently read by me): Sinus bradycardia 55 bpm.  Nonspecific ST changes.  Normal intervals.  No ectopy.  09/17/2016 ECG (independently read by me): Sinus rhythm with incomplete right bundle branch block.  Nonspecific ST-T changes.  QTc interval 431 ms, PR interval 156 ms.  September 14 2015 ECG (independently read by me):  Sinus bradycardia 57 bpm.  Incomplete right bundle branch block.  September 2015ECG (independently read by me): Normal sinus rhythm with incomplete right bundle branch block.  LABS:  BMP Latest Ref Rng & Units 01/01/2017 09/28/2015 06/09/2015  Glucose 65 - 99 mg/dL 111(H) 96 91  BUN 8 - 27 mg/dL 18 19 21(H)  Creatinine 0.57 - 1.00 mg/dL 1.32(H) 1.47(H) 1.45(H)  BUN/Creat  Ratio 12 - _0 -  Sodium 134 - 144 mmol/L 144 146(H) 142  Potassium 3.5 - 5.2 mmol/L 4.7 4.6 3.8  Chloride 96 - 106 mmol/L 103 105 106  CO2 18 - 29 mmol/L _1 Calcium 8.7 - 10.3 mg/dL 10.2 9.7 9.6   Hepatic Function Latest  Ref Rng & Units 01/01/2017 09/28/2015 05/12/2015  Total Protein 6.0 - 8.5 g/dL 7.0 6.1 6.4  Albumin 3.6 - 4.8 g/dL 4.5 3.7 4.1  AST 0 - 40 IU/L _0 ALT 0 - 32 IU/L _1 Alk Phosphatase 39 - 117 IU/L 46 46 42  Total Bilirubin 0.0 - 1.2 mg/dL 0.5 0.3 0.5  Bilirubin, Direct 0.0 - 0.3 mg/dL - - -   CBC Latest Ref Rng & Units 09/28/2015 06/09/2015 01/09/2015  WBC 3.4 - 10.8 x10E3/uL 8.2 9.8 12.4(H)  Hemoglobin 11.1 - 15.9 g/dL 12.2 13.3 12.2  Hematocrit 34.0 - 46.6 % 36.7 39.4 35.8(L)  Platelets 150 - 379 x10E3/uL 417(H) 292 346   Lab Results  Component Value Date   MCV 92 09/28/2015   MCV 96.3 06/09/2015   MCV 91.1 01/09/2015   Lab Results  Component Value Date   TSH 0.31 (L) 08/27/2016   Lab Results  Component Value Date   HGBA1C 4.8 07/07/2014   Lipid Panel     Component Value Date/Time   CHOL 150 01/01/2017 1429   TRIG 179 (H) 01/01/2017 1429   HDL 57 01/01/2017 1429   CHOLHDL 3.0 05/12/2015 1459   VLDL 30 05/12/2015 1459   LDLCALC 57 01/01/2017 1429    RADIOLOGY: No results found.  IMPRESSION: 1. Essential hypertension   2. Chronic kidney disease, stage 3 (moderate) (HCC)   3. Obesity (BMI 30-39.9)   4. Mixed hyperlipidemia   5. Anxiety and depression   6. Gastroesophageal reflux disease without esophagitis     ASSESSMENT AND PLAN: Ms. Sheron Tallman is a 66 year old female who has a history of depression and continues to take  Cymbalta 60 mg daily.  In the past she had frequent crying spells but these have improved.  She has a history of hypertension and currently is on Cartia XT 240 mg, Toprol-XL 100 mg in the morning and 50 no grams at night.  She had experienced palpitations with and with the increased Toprol regimen.   These have improved.  She has had issues with leg swelling.  She has renal insufficiency and is now followed by Dr. Stephenie Acres cod.  Remotely she had progressed to stage IV, but currently has stage III renal insufficiency.  I have suggested a trial of low-dose HCTZ 12.5 mg  and with her renal insufficiency.  She will initially take this every other day rather than daily.  Hopefully this will improve her daily swelling.  She will be seeing Dr. Lowanda Foster in follow-up and repeat laboratory will be assessed to make certain there is no adverse effect on her renal function.  She continues to be on atorvastatin 40 mg and fenofibrate for mixed hyperlipidemia with omega-3 fatty acids.  Recent lipid studies were improved, although triglycerides remain slightly elevated at 179.  Normal LFTs.  Hemoglobin A1c is excellent at 4.8.  I have recommended that she reduce sodium in her diet.  She has had issues with back discomfort and will be undergoing a myelogram in December further evaluate her low back pain with right sciatica symptomatology.  She also continues to express bilateral knee discomfort for which she is followed by Dr. Alma Friendly and she tells me she may ultimately require knee surgery.  Her GERD is currently controlled with Protonix.  As long as she is stable, I will see her in 6 months for cardiology reevaluation.  Time spent: 25 minutes  Troy Sine, MD, [facility]  07/18/2017 7:23 AM

## 2017-07-18 ENCOUNTER — Encounter: Payer: Self-pay | Admitting: Cardiovascular Disease

## 2017-07-25 DIAGNOSIS — Z79891 Long term (current) use of opiate analgesic: Secondary | ICD-10-CM | POA: Diagnosis not present

## 2017-07-25 DIAGNOSIS — M25552 Pain in left hip: Secondary | ICD-10-CM | POA: Diagnosis not present

## 2017-07-25 DIAGNOSIS — R252 Cramp and spasm: Secondary | ICD-10-CM | POA: Diagnosis not present

## 2017-07-25 DIAGNOSIS — M5415 Radiculopathy, thoracolumbar region: Secondary | ICD-10-CM | POA: Diagnosis not present

## 2017-07-25 DIAGNOSIS — M961 Postlaminectomy syndrome, not elsewhere classified: Secondary | ICD-10-CM | POA: Diagnosis not present

## 2017-07-25 DIAGNOSIS — M545 Low back pain: Secondary | ICD-10-CM | POA: Diagnosis not present

## 2017-07-29 ENCOUNTER — Other Ambulatory Visit: Payer: Self-pay | Admitting: Endocrinology

## 2017-07-31 DIAGNOSIS — M5415 Radiculopathy, thoracolumbar region: Secondary | ICD-10-CM | POA: Diagnosis not present

## 2017-07-31 DIAGNOSIS — M961 Postlaminectomy syndrome, not elsewhere classified: Secondary | ICD-10-CM | POA: Diagnosis not present

## 2017-07-31 DIAGNOSIS — M545 Low back pain: Secondary | ICD-10-CM | POA: Diagnosis not present

## 2017-08-05 ENCOUNTER — Encounter: Payer: Self-pay | Admitting: Family Medicine

## 2017-08-06 ENCOUNTER — Telehealth: Payer: Self-pay | Admitting: Cardiovascular Disease

## 2017-08-06 NOTE — Telephone Encounter (Signed)
New message   Pt verbalized that she is calling to give RN   Her new PCP information Mart Dr.Asley Gottscalk [phone]

## 2017-08-06 NOTE — Telephone Encounter (Signed)
Returned call to patient left message on personal voice mail will change PCP in your chart.

## 2017-08-07 ENCOUNTER — Ambulatory Visit (HOSPITAL_COMMUNITY)
Admission: RE | Admit: 2017-08-07 | Discharge: 2017-08-07 | Disposition: A | Discharge status: Home / Self Care | Payer: Medicare Other | Source: Ambulatory Visit | Attending: Neurological Surgery | Admitting: Neurological Surgery

## 2017-08-07 ENCOUNTER — Ambulatory Visit (HOSPITAL_COMMUNITY): Admission: RE | Admit: 2017-08-07 | Payer: Medicare Other | Source: Ambulatory Visit

## 2017-08-07 DIAGNOSIS — M4316 Spondylolisthesis, lumbar region: Secondary | ICD-10-CM | POA: Insufficient documentation

## 2017-08-07 DIAGNOSIS — M47892 Other spondylosis, cervical region: Secondary | ICD-10-CM | POA: Diagnosis not present

## 2017-08-07 DIAGNOSIS — M1288 Other specific arthropathies, not elsewhere classified, other specified site: Secondary | ICD-10-CM | POA: Diagnosis not present

## 2017-08-07 DIAGNOSIS — M47812 Spondylosis without myelopathy or radiculopathy, cervical region: Secondary | ICD-10-CM | POA: Diagnosis not present

## 2017-08-07 DIAGNOSIS — M48062 Spinal stenosis, lumbar region with neurogenic claudication: Secondary | ICD-10-CM

## 2017-08-07 DIAGNOSIS — M8938 Hypertrophy of bone, other site: Secondary | ICD-10-CM | POA: Insufficient documentation

## 2017-08-07 DIAGNOSIS — M4802 Spinal stenosis, cervical region: Secondary | ICD-10-CM | POA: Insufficient documentation

## 2017-08-07 DIAGNOSIS — M48061 Spinal stenosis, lumbar region without neurogenic claudication: Secondary | ICD-10-CM | POA: Insufficient documentation

## 2017-08-07 DIAGNOSIS — M4317 Spondylolisthesis, lumbosacral region: Secondary | ICD-10-CM | POA: Insufficient documentation

## 2017-08-07 DIAGNOSIS — M5136 Other intervertebral disc degeneration, lumbar region: Secondary | ICD-10-CM | POA: Insufficient documentation

## 2017-08-07 DIAGNOSIS — M5126 Other intervertebral disc displacement, lumbar region: Secondary | ICD-10-CM | POA: Diagnosis not present

## 2017-08-07 DIAGNOSIS — M4312 Spondylolisthesis, cervical region: Secondary | ICD-10-CM | POA: Diagnosis not present

## 2017-08-07 MED ORDER — LIDOCAINE HCL (PF) 1 % IJ SOLN
INTRAMUSCULAR | Status: AC
  Administered 2017-08-07: 2 mL via INTRADERMAL
  Filled 2017-08-07: qty 5

## 2017-08-07 MED ORDER — ONDANSETRON HCL 4 MG/2ML IJ SOLN: 4.0000 mg | Freq: Four times a day (QID) | INTRAMUSCULAR | Status: DC | PRN

## 2017-08-07 MED ORDER — HYDROCODONE-ACETAMINOPHEN 5-325 MG PO TABS: 1.0000 | ORAL_TABLET | ORAL | Status: DC | PRN

## 2017-08-07 MED ORDER — IOPAMIDOL (ISOVUE-M 300) INJECTION 61%
15.0000 mL | Freq: Once | INTRAMUSCULAR | Status: AC | PRN
  Administered 2017-08-07: 10 mL via INTRATHECAL

## 2017-08-07 MED ORDER — DIAZEPAM 5 MG PO TABS: 10.0000 mg | ORAL_TABLET | Freq: Once | ORAL | Status: DC

## 2017-08-07 MED ORDER — LIDOCAINE HCL (PF) 1 % IJ SOLN
5.0000 mL | Freq: Once | INTRAMUSCULAR | Status: AC
Start: 2017-08-07 — End: 2017-08-07
  Administered 2017-08-07: 2 mL via INTRADERMAL

## 2017-08-07 MED ORDER — IOPAMIDOL (ISOVUE-M 300) INJECTION 61%
INTRAMUSCULAR | Status: AC
Start: 2017-08-07 — End: 2017-08-07
  Administered 2017-08-07: 10 mL via INTRATHECAL
  Filled 2017-08-07: qty 15

## 2017-08-07 MED ORDER — DIAZEPAM 5 MG PO TABS
ORAL_TABLET | ORAL | Status: AC
  Filled 2017-08-07: qty 2

## 2017-08-07 NOTE — Discharge Instructions (Signed)
Myelogram, Care After °These instructions give you information about caring for yourself after your procedure. Your doctor may also give you more specific instructions. Call your doctor if you have any problems or questions after your procedure. °Follow these instructions at home: °· Drink enough fluid to keep your pee (urine) clear or pale yellow. °· Rest as told by your doctor. °· Lie flat with your head slightly raised (elevated). °· Do not bend, lift, or do any hard activities for 24-48 hours or as told by your doctor. °· Take over-the-counter and prescription medicines only as told by your doctor. °· Take care of and remove your bandage (dressing) as told by your doctor. °· Bathe or shower as told by your doctor. °Contact a health care provider if: °· You have a fever. °· You have a headache that lasts longer than 24 hours. °· You feel sick to your stomach (nauseous). °· You throw up (vomit). °· Your neck is stiff. °· Your legs feel numb. °· You cannot pee. °· You cannot poop (have a bowel movement). °· You have a rash. °· You are itchy or sneezing. °Get help right away if: °· You have new symptoms or your symptoms get worse. °· You have a seizure. °· You have trouble breathing. °This information is not intended to replace advice given to you by your health care provider. Make sure you discuss any questions you have with your health care provider. °Document Released: 05/14/2008 Document Revised: 04/04/2016 Document Reviewed: 05/18/2015 °Elsevier Interactive Patient Education © 2018 [employer] ° °

## 2017-08-07 NOTE — Progress Notes (Signed)
Pt did not receive 10 mg of valium because she took 1 mg of ativan this morning. This RN informed the nurse down in myelo.

## 2017-08-07 NOTE — Procedures (Signed)
kwana ringel is a 66 year old individual who has had significant problems with low back pain bilateral lower extremity pain and weakness she's had a previous decompression fusion at L4-5 by Dr. Clair Gulling that can. She has had further problems now with increasing pain and weakness in her lower extremities and she also has some dysesthesias in her shoulders and arms. His been advised regarding the need for further imaging and because multiple levels are involved and there is substantial artifact from the metal screws and rods placed in her back I've advised myelography and post myelogram CAT scan. This will be performed in both the cervical and lumbar regions to evaluate both these areas.  Pre op Dx: Lumbar spondylosis and stenosis, cervical myelopathy Post op Dx: Same Procedure: Total myelogram, lumbar puncture for myelogram Surgeon: Amayrany Cafaro Puncture level: L2-3 Fluid color: Clear colorless Injection: Isovue-300, 10 mL Findings: Severe stenosis L5-S1 with spondylolisthesis. Further evaluation with CT scanning.

## 2017-08-13 DIAGNOSIS — M4317 Spondylolisthesis, lumbosacral region: Secondary | ICD-10-CM | POA: Diagnosis not present

## 2017-08-13 NOTE — Progress Notes (Signed)
Subjective: TI:[id number] care, Osteoporosis, med refills HPI: Regina Horton is a 66 y.o. female presenting to clinic today for:  1. Osteoporosis Patient notes that she has been on Evista for over 20 years.  This initially started after she went through menopause.  She has been diagnosed with osteoporosis and this has been continued.  She notes she is never been on any other osteoporosis medications in the past.  She is interested in 1 of the infusions if she is eligible.  Of note, she does have a history of CKD.  2. CKD Patient sees Dr. Lowanda Foster in [place].  She has an upcoming appointment in the next couple of weeks.  3. GERD Patient has a long-standing history of acid reflux.  She is never had a history of a GI ulcer.  She has been well controlled on Protonix 40 mg for several years.  She is aware that this medication causes increased risk of worsening bone loss and GI infection.  She wishes to continue.  No hematochezia, melena, nausea, vomiting.  4.  Hypothyroidism Patient sees Dr. Loanne Drilling in [facility] for endocrinology and management of her thyroid.  She has been well controlled on current dose.  5.  Chronic knee pain Patient sees Dr. Alma Friendly for knee injections at degrees per orthopedics.  6.  Pain management Patient currently sees Dr. Olene Craven in [place] for pain management.  She is currently well controlled on Percocet 5 and Robaxin 500 mg.  She voices no concerns at this time.  Denies excessive sedation.  7.  Anxiety/depression Patient has a long-standing history of anxiety and depression.  She notes that she has been treated with Cymbalta 60 mg twice daily for a while now and symptoms have been well controlled on this.  She does note some difficulty with sleeping which used to be relieved by Ativan.  She is interested in getting this refilled if possible.  After we discussed the interaction between benzodiazepines and opioid medications, the patient declined the refill  and wishes to simply continue with Cymbalta for management.  Past Medical History:  Diagnosis Date  . Allergic rhinitis   . Angina at rest [facility])   . Anxiety   . Aortic sclerosis   . Chronic back pain   . Chronic constipation   . CKD (chronic kidney disease)   . Depression   . Fibromyalgia   . GERD (gastroesophageal reflux disease)   . Hyperlipidemia   . Hypertension   . Hypertension   . Mitral regurgitation   . Palpitations   . Thyroid disease   . Tricuspid regurgitation    Past Surgical History:  Procedure Laterality Date  . BACK SURGERY  2005  . BREAST LUMPECTOMY  1970   right   . CATARACT EXTRACTION W/PHACO Right 06/15/2015   Procedure: CATARACT EXTRACTION PHACO AND INTRAOCULAR LENS PLACEMENT (IOC);  Surgeon: Tonny Branch, MD;  Location: [facility];  Service: Ophthalmology;  Laterality: Right;  CDE: 10.33  . CATARACT EXTRACTION W/PHACO Left 07/17/2015   Procedure: CATARACT EXTRACTION PHACO AND INTRAOCULAR LENS PLACEMENT (IOC);  Surgeon: Tonny Branch, MD;  Location: [facility];  Service: Ophthalmology;  Laterality: Left;  CDE:7.60  . NM MYOCAR PERF WALL MOTION  02/16/2009   normal  . ORIF ANKLE FRACTURE Left 10/26/2014   Procedure: OPEN REDUCTION INTERNAL FIXATION (ORIF) ANKLE FRACTURE;  Surgeon: Sanjuana Kava, MD;  Location: [facility];  Service: Orthopedics;  Laterality: Left;  . TONSILLECTOMY  childhood   . TUBAL LIGATION  1973  . US ECHOCARDIOGRAPHY  12/19/2008   mild mitral annular ca+,AOV mildly sclerotic,mild MR & TR   Social History   Socioeconomic History  . Marital status: Married    Spouse name: Not on file  . Number of children: 2  . Years of education: Not on file  . Highest education level: Not on file  Social Needs  . Financial resource strain: Not on file  . Food insecurity - worry: Not on file  . Food insecurity - inability: Not on file  . Transportation needs - medical: Not on file  . Transportation needs - non-medical: Not on file  Occupational History  .  Occupation: disabled   Tobacco Use  . Smoking status: Former Smoker    Packs/day: 1.00    Years: 20.00    Pack years: 20.00    Types: Cigarettes    Last attempt to quit: 12/24/2003    Years since quitting: 13.6  . Smokeless tobacco: Never Used  Substance and Sexual Activity  . Alcohol use: No  . Drug use: Yes    Types: Marijuana    Comment: boils marijuana in water and makes a tea about 3 times a week  . Sexual activity: Not Currently    Birth control/protection: Surgical  Other Topics Concern  . Not on file  Social History Narrative  . Not on file   No outpatient medications have been marked as taking for the 08/15/17 encounter (Appointment) with [name], DO.   Family History  Problem Relation Age of Onset  . Rheum arthritis Mother   . Osteoporosis Mother   . Heart disease Father   . Diabetes Father   . Hyperlipidemia Father   . Bladder Cancer Father   . Heart disease Brother   . Arthritis Brother    Allergies  Allergen Reactions  . Xyzal [Levocetirizine Dihydrochloride] Hives and Itching  . Codeine     Pt states she does not have any problems with codeine.  . Nickel Rash     Health Maintenance: Colonoscopy  ROS: Per HPI  Objective: Office vital signs reviewed. BP (!) 141/73   Pulse 62   Temp 97.7 F (36.5 C) (Oral)   Ht '5\' 5"'  (1.651 m)   Wt 199 lb (90.3 kg)   BMI 33.12 kg/m   Physical Examination:  General: Awake, alert, well appearing female, No acute distress Cardio: regular rate and rhythm, S1S2 heard, no murmurs appreciated Pulm: clear to auscultation bilaterally, no wheezes, rhonchi or rales; normal work of breathing on room air Ext: warm, well-perfused, no edema. MSK: Uses cane for ambulation. Neuro: Follows all commands, alert and oriented x3 Psych: Mood stable, speech normal, pleasant Depression screen [facility] 2/9 08/15/2017 05/05/2017 01/10/2017  Decreased Interest 1 0 1  Down, Depressed, Hopeless 0 0 3  PHQ - 2 Score 1 0 4  Altered  sleeping - - 3  Tired, decreased energy - - 3  Change in appetite - - 3  Feeling bad or failure about yourself  - - 3  Trouble concentrating - - 2  Moving slowly or fidgety/restless - - 1  Suicidal thoughts - - 0  PHQ-9 Score - - 19  Difficult doing work/chores - - Very difficult     Assessment/ Plan: 66 y.o. female   1. Kidney disease, chronic, stage III (moderate, EGFR 30-59 ml/min) (HCC) Recheck renal function.  Patient is currently on gabapentin 800 mg twice daily.  Max dose recommended for last GFR is 600 milligrams twice daily.  Patient is amenable to  decreasing this if renal function continues to be impaired, as pain is much more tolerable on current regimen prescribed by pain management.  Will contact patient with results. - Basic Metabolic Panel  2. Encounter for medical examination to establish care Release of information to be completed for her pain management doctor and orthopedist.  She is currently plugged into nephrology, gastroenterology, pain management, endocrinology and cardiology.  Patient to follow-up in the next couple of months for continued preventive health care.  Will need to get a Pap smear performed at some point.  She has seen Dr. Glo Herring in the past for this.  3. Osteoporosis without current pathological fracture, unspecified osteoporosis type Has not had a DEXA in greater than 2 years.  Will order this and patient will schedule at her convenience.  We discussed considering alternatives to Evista that may be more beneficial for promotion of bone strength.  Particularly considering initiation of Prolia, which patient is very interested in.  We will send her record to our coordinator for this to determine whether or not her insurance will cover this for her once DEXA has been completed. - DG [facility] DEXA  4. Encounter for immunization - Flu vaccine HIGH DOSE PF   Dash Cardarelli [place], [place] [phone]

## 2017-08-14 ENCOUNTER — Other Ambulatory Visit: Payer: Self-pay | Admitting: Cardiovascular Disease

## 2017-08-15 ENCOUNTER — Encounter: Payer: Self-pay | Admitting: Family Medicine

## 2017-08-15 ENCOUNTER — Ambulatory Visit (INDEPENDENT_AMBULATORY_CARE_PROVIDER_SITE_OTHER): Payer: Medicare Other | Admitting: Family Medicine

## 2017-08-15 VITALS — BP 141/73 | HR 62 | Temp 97.7°F | Ht 65.0 in | Wt 199.0 lb

## 2017-08-15 DIAGNOSIS — N183 Chronic kidney disease, stage 3 unspecified: Secondary | ICD-10-CM

## 2017-08-15 DIAGNOSIS — Z0000 Encounter for general adult medical examination without abnormal findings: Secondary | ICD-10-CM

## 2017-08-15 DIAGNOSIS — M810 Age-related osteoporosis without current pathological fracture: Secondary | ICD-10-CM | POA: Diagnosis not present

## 2017-08-15 DIAGNOSIS — Z23 Encounter for immunization: Secondary | ICD-10-CM

## 2017-08-15 MED ORDER — PANTOPRAZOLE SODIUM 40 MG PO TBEC: 40.0000 mg | DELAYED_RELEASE_TABLET | Freq: Every day | ORAL | 1 refills | Status: DC

## 2017-08-15 MED ORDER — CYMBALTA 60 MG PO CPEP: 60.0000 mg | ORAL_CAPSULE | Freq: Two times a day (BID) | ORAL | 1 refills | Status: DC

## 2017-08-15 MED ORDER — RALOXIFENE HCL 60 MG PO TABS: 60.0000 mg | ORAL_TABLET | Freq: Every day | ORAL | 0 refills | Status: DC

## 2017-08-15 NOTE — Patient Instructions (Addendum)
Ask your insurance if generic Crestor is cheaper than generic Lipitor.  If it is, call me and I will change it.  Ask your kidney specialist about your meds.  I will work on your osteoporosis meds.  DEXA has been ordered.  Schedule your colonoscopy.  I value your feedback and appreciate you entrusting [place] with your care.  If you get a survey, I would appreciate your taking the time to let us know what your experience was like.  Osteoporosis Osteoporosis happens when your bones become thinner and weaker. Weak bones can break (fracture) more easily when you slip or fall. Bones most at risk of breaking are in the hip, wrist, and spine. Follow these instructions at home:  Get enough calcium and vitamin D. These nutrients are good for your bones.  Exercise as told by your doctor.  Do not use any tobacco products. This includes cigarettes, chewing tobacco, and electronic cigarettes. If you need help quitting, ask your doctor.  Limit the amount of alcohol you drink.  Take medicines only as told by your doctor.  Keep all follow-up visits as told by your doctor. This is important.  Take care at home to prevent falls. Some ways to do this are: ? Keep rooms well lit and tidy. ? Put safety rails on your stairs. ? Put a rubber mat in the bathroom and other places that are often wet or slippery. Get help right away if:  You fall.  You hurt yourself. This information is not intended to replace advice given to you by your health care provider. Make sure you discuss any questions you have with your health care provider. Document Released: 10/28/2011 Document Revised: 01/11/2016 Document Reviewed: 01/13/2014 Elsevier Interactive Patient Education  [employer].

## 2017-08-22 ENCOUNTER — Encounter: Payer: Self-pay | Admitting: Family Medicine

## 2017-08-22 ENCOUNTER — Encounter: Payer: Self-pay | Admitting: Cardiovascular Disease

## 2017-08-22 ENCOUNTER — Other Ambulatory Visit: Payer: Self-pay

## 2017-08-22 MED ORDER — ATORVASTATIN CALCIUM 40 MG PO TABS: 40.0000 mg | ORAL_TABLET | Freq: Every day | ORAL | 6 refills | Status: DC

## 2017-08-23 ENCOUNTER — Encounter: Payer: Self-pay | Admitting: Cardiovascular Disease

## 2017-08-25 DIAGNOSIS — Z79891 Long term (current) use of opiate analgesic: Secondary | ICD-10-CM | POA: Diagnosis not present

## 2017-08-25 DIAGNOSIS — M961 Postlaminectomy syndrome, not elsewhere classified: Secondary | ICD-10-CM | POA: Diagnosis not present

## 2017-08-25 DIAGNOSIS — M5415 Radiculopathy, thoracolumbar region: Secondary | ICD-10-CM | POA: Diagnosis not present

## 2017-08-25 DIAGNOSIS — R252 Cramp and spasm: Secondary | ICD-10-CM | POA: Diagnosis not present

## 2017-08-29 ENCOUNTER — Encounter: Payer: Self-pay | Admitting: Endocrinology

## 2017-08-29 ENCOUNTER — Ambulatory Visit (INDEPENDENT_AMBULATORY_CARE_PROVIDER_SITE_OTHER): Payer: Medicare Other | Admitting: Endocrinology

## 2017-08-29 VITALS — BP 158/82 | HR 86 | Wt 196.8 lb

## 2017-08-29 DIAGNOSIS — E038 Other specified hypothyroidism: Secondary | ICD-10-CM

## 2017-08-29 DIAGNOSIS — E042 Nontoxic multinodular goiter: Secondary | ICD-10-CM

## 2017-08-29 LAB — TSH: TSH: 3.94 u[IU]/mL (ref 0.35–4.50)

## 2017-08-29 NOTE — Progress Notes (Signed)
Subjective:    Patient ID: Regina Horton, female    DOB: 1951/03/12, 67 y.o.   MRN: [id number]  HPI Pt returns for f/u of a small multinodular goiter and hypothyroidism (both dx'ed 2011; f/u US in 2015 showed nodules were again too small to need bx; she has been on synthroid since 2011).  She does not notice any lump in the neck.  pt states she feels well in general, except for fatigue.   Past Medical History:  Diagnosis Date  . Allergic rhinitis   . Angina at rest [facility])   . Anxiety   . Aortic sclerosis   . Cataract 2016  . Chronic back pain   . Chronic constipation   . CKD (chronic kidney disease)   . Depression   . Fibromyalgia   . GERD (gastroesophageal reflux disease)   . Hyperlipidemia   . Hypertension   . Hypertension   . Mitral regurgitation   . Osteoporosis   . Palpitations   . Thyroid disease   . Tricuspid regurgitation     Past Surgical History:  Procedure Laterality Date  . BACK SURGERY  2005  . BREAST LUMPECTOMY  1970   right   . CATARACT EXTRACTION W/PHACO Right 06/15/2015   Procedure: CATARACT EXTRACTION PHACO AND INTRAOCULAR LENS PLACEMENT (IOC);  Surgeon: Tonny Branch, MD;  Location: [facility];  Service: Ophthalmology;  Laterality: Right;  CDE: 10.33  . CATARACT EXTRACTION W/PHACO Left 07/17/2015   Procedure: CATARACT EXTRACTION PHACO AND INTRAOCULAR LENS PLACEMENT (IOC);  Surgeon: Tonny Branch, MD;  Location: [facility];  Service: Ophthalmology;  Laterality: Left;  CDE:7.60  . NM MYOCAR PERF WALL MOTION  02/16/2009   normal  . ORIF ANKLE FRACTURE Left 10/26/2014   Procedure: OPEN REDUCTION INTERNAL FIXATION (ORIF) ANKLE FRACTURE;  Surgeon: Sanjuana Kava, MD;  Location: [facility];  Service: Orthopedics;  Laterality: Left;  . TONSILLECTOMY  childhood   . TUBAL LIGATION  1973  . US ECHOCARDIOGRAPHY  12/19/2008   mild mitral annular ca+,AOV mildly sclerotic,mild MR & TR    Social History   Socioeconomic History  . Marital status: Married    Spouse name: Not on file    . Number of children: 2  . Years of education: Not on file  . Highest education level: Not on file  Social Needs  . Financial resource strain: Not on file  . Food insecurity - worry: Not on file  . Food insecurity - inability: Not on file  . Transportation needs - medical: Not on file  . Transportation needs - non-medical: Not on file  Occupational History  . Occupation: disabled   Tobacco Use  . Smoking status: Former Smoker    Packs/day: 1.00    Years: 20.00    Pack years: 20.00    Types: Cigarettes    Last attempt to quit: 12/24/2003    Years since quitting: 13.6  . Smokeless tobacco: Never Used  Substance and Sexual Activity  . Alcohol use: No  . Drug use: Yes    Types: Marijuana    Comment: boils marijuana in water and makes a tea about 3 times a week  . Sexual activity: Not Currently    Birth control/protection: Surgical  Other Topics Concern  . Not on file  Social History Narrative  . Not on file    Current Outpatient Medications on File Prior to Visit  Medication Sig Dispense Refill  . atorvastatin (LIPITOR) 40 MG tablet Take 1 tablet (40 mg total) by mouth  daily. 30 tablet 6  . b complex vitamins tablet Take 1 tablet by mouth daily. 1000 units    . CARTIA XT 240 MG 24 hr capsule TAKE 1 CAPSULE BY MOUTH ONCE DAILY 90 capsule 2  . Cholecalciferol (VITAMIN D) 1000 UNITS capsule Take 2,000 Units by mouth 2 (two) times daily.     . CYMBALTA 60 MG capsule Take 1 capsule (60 mg total) by mouth 2 (two) times daily. 60 capsule 1  . fenofibrate 160 MG tablet TAKE ONE TABLET BY MOUTH DAILY 90 tablet 3  . fluticasone (FLONASE) 50 MCG/ACT nasal spray Place 2 sprays into both nostrils daily. 16 g 6  . gabapentin (NEURONTIN) 800 MG tablet TAKE ONE TABLET BY MOUTH TWICE DAILY 180 tablet 1  . hydrochlorothiazide (MICROZIDE) 12.5 MG capsule Take 1 capsule (12.5 mg total) by mouth every other day. 45 capsule 3  . hydrOXYzine (VISTARIL) 25 MG capsule Take 25 mg by mouth 2 (two) times  daily at 8 am and 10 pm.    . levothyroxine (SYNTHROID, LEVOTHROID) 50 MCG tablet TAKE ONE TABLET BY MOUTH ONCE DAILY 90 tablet 3  . methocarbamol (ROBAXIN) 500 MG tablet Take 500 mg by mouth 2 (two) times daily.    . metoprolol succinate (TOPROL-XL) 100 MG 24 hr tablet Take 1 tablet in the morning and 1/2 tablet in the evening. (Patient taking differently: Take 50-100 mg by mouth See admin instructions. Take 1 tablet in the morning and 1/2 tablet in the evening.) 135 tablet 3  . omega-3 acid ethyl esters (LOVAZA) 1 g capsule TAKE TWO CAPSULES BY MOUTH TWICE DAILY 180 capsule 3  . oxyCODONE-acetaminophen (PERCOCET/ROXICET) 5-325 MG tablet Take 1 tablet by mouth every 8 (eight) hours as needed for moderate pain or severe pain.    . pantoprazole (PROTONIX) 40 MG tablet Take 1 tablet (40 mg total) by mouth daily. 90 tablet 1  . raloxifene (EVISTA) 60 MG tablet Take 1 tablet (60 mg total) by mouth daily. 90 tablet 0   No current facility-administered medications on file prior to visit.     Allergies  Allergen Reactions  . Xyzal [Levocetirizine Dihydrochloride] Hives and Itching  . Codeine     Pt states she does not have any problems with codeine.  . Nickel Rash    Family History  Problem Relation Age of Onset  . Rheum arthritis Mother   . Osteoporosis Mother   . Heart disease Father   . Diabetes Father   . Hyperlipidemia Father   . Bladder Cancer Father   . Heart disease Brother   . Arthritis Brother     BP (!) 158/82 (BP Location: Left Arm, Patient Position: Sitting, Cuff Size: Normal)   Pulse 86   Wt 196 lb 12.8 oz (89.3 kg)   SpO2 95%   BMI 32.75 kg/m    Review of Systems No weight change    Objective:   Physical Exam VITAL SIGNS:  See vs page GENERAL: no distress NECK: There is no palpable thyroid enlargement.  No thyroid nodule is palpable.  No palpable lymphadenopathy at the anterior neck.    Lab Results  Component Value Date   TSH 3.94 08/29/2017        Assessment & Plan:  Multinodular goiter, due for recheck.   Hypothyroidism: well-replaced.  Patient Instructions  Let's recheck the ultrasound.  you will receive a phone call, about a day and time for an appointment.   blood tests are requested for you today.  We'll  let you know about the results.  Please return in 1 year.

## 2017-08-29 NOTE — Patient Instructions (Addendum)
Let's recheck the ultrasound.  you will receive a phone call, about a day and time for an appointment.   blood tests are requested for you today.  We'll let you know about the results.  Please return in 1 year.

## 2017-09-02 ENCOUNTER — Encounter: Payer: Self-pay | Admitting: Cardiovascular Disease

## 2017-09-05 ENCOUNTER — Encounter: Payer: Self-pay | Admitting: Family Medicine

## 2017-09-16 ENCOUNTER — Other Ambulatory Visit: Payer: Medicare Other

## 2017-09-24 DIAGNOSIS — G4701 Insomnia due to medical condition: Secondary | ICD-10-CM | POA: Diagnosis not present

## 2017-09-24 DIAGNOSIS — M5415 Radiculopathy, thoracolumbar region: Secondary | ICD-10-CM | POA: Diagnosis not present

## 2017-09-24 DIAGNOSIS — M961 Postlaminectomy syndrome, not elsewhere classified: Secondary | ICD-10-CM | POA: Diagnosis not present

## 2017-09-24 DIAGNOSIS — R252 Cramp and spasm: Secondary | ICD-10-CM | POA: Diagnosis not present

## 2017-09-24 DIAGNOSIS — Z79891 Long term (current) use of opiate analgesic: Secondary | ICD-10-CM | POA: Diagnosis not present

## 2017-10-24 ENCOUNTER — Encounter: Payer: Self-pay | Admitting: Family Medicine

## 2017-10-24 MED ORDER — RALOXIFENE HCL 60 MG PO TABS: 60.0000 mg | ORAL_TABLET | Freq: Every day | ORAL | 0 refills | Status: DC

## 2017-10-29 ENCOUNTER — Ambulatory Visit (HOSPITAL_COMMUNITY): Payer: Medicare Other

## 2017-11-04 ENCOUNTER — Ambulatory Visit (HOSPITAL_COMMUNITY)
Admission: RE | Admit: 2017-11-04 | Discharge: 2017-11-04 | Disposition: A | Discharge status: Home / Self Care | Payer: Medicare Other | Source: Ambulatory Visit | Attending: Endocrinology | Admitting: Endocrinology

## 2017-11-04 DIAGNOSIS — E042 Nontoxic multinodular goiter: Secondary | ICD-10-CM | POA: Insufficient documentation

## 2017-11-18 ENCOUNTER — Ambulatory Visit (INDEPENDENT_AMBULATORY_CARE_PROVIDER_SITE_OTHER): Payer: Medicare Other

## 2017-11-18 ENCOUNTER — Ambulatory Visit (INDEPENDENT_AMBULATORY_CARE_PROVIDER_SITE_OTHER): Payer: Medicare Other | Admitting: Family Medicine

## 2017-11-18 VITALS — BP 132/71 | HR 59 | Temp 98.6°F | Ht 65.0 in | Wt 207.2 lb

## 2017-11-18 DIAGNOSIS — E559 Vitamin D deficiency, unspecified: Secondary | ICD-10-CM | POA: Diagnosis not present

## 2017-11-18 DIAGNOSIS — N183 Chronic kidney disease, stage 3 unspecified: Secondary | ICD-10-CM

## 2017-11-18 DIAGNOSIS — G8929 Other chronic pain: Secondary | ICD-10-CM | POA: Diagnosis not present

## 2017-11-18 DIAGNOSIS — E042 Nontoxic multinodular goiter: Secondary | ICD-10-CM | POA: Diagnosis not present

## 2017-11-18 DIAGNOSIS — R809 Proteinuria, unspecified: Secondary | ICD-10-CM | POA: Diagnosis not present

## 2017-11-18 DIAGNOSIS — D509 Iron deficiency anemia, unspecified: Secondary | ICD-10-CM | POA: Diagnosis not present

## 2017-11-18 DIAGNOSIS — M810 Age-related osteoporosis without current pathological fracture: Secondary | ICD-10-CM

## 2017-11-18 DIAGNOSIS — Z79899 Other long term (current) drug therapy: Secondary | ICD-10-CM | POA: Diagnosis not present

## 2017-11-18 DIAGNOSIS — M5440 Lumbago with sciatica, unspecified side: Secondary | ICD-10-CM

## 2017-11-18 DIAGNOSIS — I10 Essential (primary) hypertension: Secondary | ICD-10-CM | POA: Diagnosis not present

## 2017-11-18 NOTE — Progress Notes (Signed)
Subjective: CC: 3 month follow up HPI: Regina Horton is a 67 y.o. female presenting to clinic today for:  1. CKD Patient reports that she has been doing well.  She has an appointment with renal next week.  She notes that her cardiologist recently added hydrochlorothiazide to reduce some of her fluid.  She was told that she has a touch of heart failure.  She notes that her breathing has improved after addition of the hydrochlorothiazide.  She is using this about every other day.  2.  Osteoporosis Patient has her DEXA scheduled today.  She is interested in proceeding with Prolia.  She brings paperwork noting that her insurance has approved it.  Currently treated with Evista.  Denies any bone pain but denies a history of chronic back pain and neck pain.  3.  Thyroid nodules Patient notes that she saw her endocrinologist recently and had a thyroid ultrasound.  She notes that the nodules remain small and are not needing to be biopsied yet.  She has follow-up with him in 1 year.  She notes that she is to stay on her current dose of Synthroid.  She notes compliance and good tolerance.  No hyper or hypothyroid symptoms.  4.  Chronic low back pain and neck pain  patient notes that she [facility]  She reports that Robaxin 500 mg p.o. twice daily was added as well as her Percocet increased to 7.5 mg every 8 hours.  She was noted to have cervical spondylosis at C4-C5, C5-C6 and C6-C7.  The worst levels at C6-C7 where she has left-sided foraminal stenosis.  There is moderate central canal and lateral recess stenosis.  Her pain is primarily in her shoulder not in her neck.  No radiculopathy.  Patient wishes to avoid surgery at this time.  Additionally, she is seen by Dr. Merlene Laughter for epidural injections for low back pain.  She had a lumbar myelogram that noted L5-S1 had the worst area of stenosis where there is a high-grade central canal stenosis and lateral recess  stenosis of both S1 nerve roots.  Again, patient wishes to avoid surgical intervention for now, as her epidural injection seem to be controlling the low back pain pretty well.  Denies worsening of lower extremity pain or weakness.  ROS: Per HPI  Past Medical History:  Diagnosis Date  . Allergic rhinitis   . Angina at rest [facility])   . Anxiety   . Aortic sclerosis   . Cataract 2016  . Chronic back pain   . Chronic constipation   . CKD (chronic kidney disease)   . Depression   . Fibromyalgia   . GERD (gastroesophageal reflux disease)   . Hyperlipidemia   . Hypertension   . Hypertension   . Mitral regurgitation   . Osteoporosis   . Palpitations   . Thyroid disease   . Tricuspid regurgitation    Allergies  Allergen Reactions  . Xyzal [Levocetirizine Dihydrochloride] Hives and Itching  . Codeine     Pt states she does not have any problems with codeine.  . Nickel Rash    Current Outpatient Medications:  .  atorvastatin (LIPITOR) 40 MG tablet, Take 1 tablet (40 mg total) by mouth daily., Disp: 30 tablet, Rfl: 6 .  b complex vitamins tablet, Take 1 tablet by mouth daily. 1000 units, Disp: , Rfl:  .  CARTIA XT 240 MG 24 hr capsule, TAKE 1 CAPSULE BY MOUTH ONCE DAILY, Disp: 90  capsule, Rfl: 2 .  Cholecalciferol (VITAMIN D) 1000 UNITS capsule, Take 2,000 Units by mouth 2 (two) times daily. , Disp: , Rfl:  .  CYMBALTA 60 MG capsule, Take 1 capsule (60 mg total) by mouth 2 (two) times daily., Disp: 60 capsule, Rfl: 1 .  fenofibrate 160 MG tablet, TAKE ONE TABLET BY MOUTH DAILY, Disp: 90 tablet, Rfl: 3 .  fluticasone (FLONASE) 50 MCG/ACT nasal spray, Place 2 sprays into both nostrils daily., Disp: 16 g, Rfl: 6 .  gabapentin (NEURONTIN) 800 MG tablet, TAKE ONE TABLET BY MOUTH TWICE DAILY, Disp: 180 tablet, Rfl: 1 .  hydrOXYzine (VISTARIL) 25 MG capsule, Take 25 mg by mouth 2 (two) times daily at 8 am and 10 pm., Disp: , Rfl:  .  levothyroxine (SYNTHROID, LEVOTHROID) 50 MCG tablet, TAKE ONE  TABLET BY MOUTH ONCE DAILY, Disp: 90 tablet, Rfl: 3 .  methocarbamol (ROBAXIN) 500 MG tablet, Take 500 mg by mouth 2 (two) times daily., Disp: , Rfl:  .  metoprolol succinate (TOPROL-XL) 100 MG 24 hr tablet, Take 1 tablet in the morning and 1/2 tablet in the evening. (Patient taking differently: Take 50-100 mg by mouth See admin instructions. Take 1 tablet in the morning and 1/2 tablet in the evening.), Disp: 135 tablet, Rfl: 3 .  omega-3 acid ethyl esters (LOVAZA) 1 g capsule, TAKE TWO CAPSULES BY MOUTH TWICE DAILY, Disp: 180 capsule, Rfl: 3 .  oxyCODONE-acetaminophen (PERCOCET) 7.5-325 MG tablet, Take 1 tablet by mouth every 8 (eight) hours as needed., Disp: , Rfl:  .  pantoprazole (PROTONIX) 40 MG tablet, Take 1 tablet (40 mg total) by mouth daily., Disp: 90 tablet, Rfl: 1 .  raloxifene (EVISTA) 60 MG tablet, Take 1 tablet (60 mg total) by mouth daily., Disp: 90 tablet, Rfl: 0 .  hydrochlorothiazide (MICROZIDE) 12.5 MG capsule, Take 1 capsule (12.5 mg total) by mouth every other day., Disp: 45 capsule, Rfl: 3 Social History   Socioeconomic History  . Marital status: Married    Spouse name: Not on file  . Number of children: 2  . Years of education: Not on file  . Highest education level: Not on file  Occupational History  . Occupation: disabled   Social Needs  . Financial resource strain: Not on file  . Food insecurity:    Worry: Not on file    Inability: Not on file  . Transportation needs:    Medical: Not on file    Non-medical: Not on file  Tobacco Use  . Smoking status: Former Smoker    Packs/day: 1.00    Years: 20.00    Pack years: 20.00    Types: Cigarettes    Last attempt to quit: 12/24/2003    Years since quitting: 13.9  . Smokeless tobacco: Never Used  Substance and Sexual Activity  . Alcohol use: No  . Drug use: Yes    Types: Marijuana    Comment: boils marijuana in water and makes a tea about 3 times a week  . Sexual activity: Not Currently    Birth  control/protection: Surgical  Lifestyle  . Physical activity:    Days per week: Not on file    Minutes per session: Not on file  . Stress: Not on file  Relationships  . Social connections:    Talks on phone: Not on file    Gets together: Not on file    Attends religious service: Not on file    Active member of club or organization: Not on  file    Attends meetings of clubs or organizations: Not on file    Relationship status: Not on file  . Intimate partner violence:    Fear of current or ex partner: Not on file    Emotionally abused: Not on file    Physically abused: Not on file    Forced sexual activity: Not on file  Other Topics Concern  . Not on file  Social History Narrative  . Not on file   Family History  Problem Relation Age of Onset  . Rheum arthritis Mother   . Osteoporosis Mother   . Heart disease Father   . Diabetes Father   . Hyperlipidemia Father   . Bladder Cancer Father   . Heart disease Brother   . Arthritis Brother     Health Maintenance: DEXA  Objective: Office vital signs reviewed. BP 132/71   Pulse (!) 59   Temp 98.6 F (37 C) (Oral)   Ht '5\' 5"'  (1.651 m)   Wt 207 lb 3.2 oz (94 kg)   BMI 34.48 kg/m   Physical Examination:  General: Awake, alert, well appearing female, No acute distress HEENT: Normal    Neck: No masses palpated. No lymphadenopathy; thyroid full feeling but no discrete nodules appreciated.  No carotid bruits.  No JVD noted.    Eyes: PERRLA, extraocular movement in tact, sclera white, no exophthalmos.    Nose: nasal turbinates moist, no nasal discharge    Throat: moist mucus membranes, no erythema, no tonsillar exudate.  Airway is patent Cardio: regular rate and rhythm, S1S2 heard, no murmurs appreciated Pulm: clear to auscultation bilaterally, no wheezes, rhonchi or rales; normal work of breathing on room air MSK: Uses cane for ambulation.  Assessment/ Plan: 67 y.o. female   1. Osteoporosis without current pathological  fracture, unspecified osteoporosis type Obtain DEXA today.  We will plan to schedule for Prolia injection.  Will likely need to wean off of Evista.  2. Multinodular goiter Continues to be followed by endocrinology.  We will continue to follow along with their recommendations.  3. Kidney disease, chronic, stage III (moderate, EGFR 30-59 ml/min) (HCC) Has upcoming appointment with renal.  I recommended that she have them go through her med list to make sure that there are no changes that need to be made based on renal function.  4. Chronic bilateral low back pain with sciatica, sciatica laterality unspecified Continues to be responding well to epidural injections with pain management.  No changes made today.  I have scanned a copy of their note into her chart.  I have also signed for her to have a handicap placard.  She will follow-up as needed.   [name], DO [place] [phone]

## 2017-11-19 DIAGNOSIS — M85852 Other specified disorders of bone density and structure, left thigh: Secondary | ICD-10-CM | POA: Diagnosis not present

## 2017-11-19 DIAGNOSIS — Z780 Asymptomatic menopausal state: Secondary | ICD-10-CM | POA: Diagnosis not present

## 2017-11-20 DIAGNOSIS — G894 Chronic pain syndrome: Secondary | ICD-10-CM | POA: Diagnosis not present

## 2017-11-20 DIAGNOSIS — G4701 Insomnia due to medical condition: Secondary | ICD-10-CM | POA: Diagnosis not present

## 2017-11-20 DIAGNOSIS — M5415 Radiculopathy, thoracolumbar region: Secondary | ICD-10-CM | POA: Diagnosis not present

## 2017-11-20 DIAGNOSIS — M545 Low back pain: Secondary | ICD-10-CM | POA: Diagnosis not present

## 2017-11-20 DIAGNOSIS — Z79891 Long term (current) use of opiate analgesic: Secondary | ICD-10-CM | POA: Diagnosis not present

## 2017-11-20 DIAGNOSIS — R252 Cramp and spasm: Secondary | ICD-10-CM | POA: Diagnosis not present

## 2017-11-20 DIAGNOSIS — M961 Postlaminectomy syndrome, not elsewhere classified: Secondary | ICD-10-CM | POA: Diagnosis not present

## 2017-11-24 ENCOUNTER — Encounter: Payer: Self-pay | Admitting: Family Medicine

## 2017-11-24 ENCOUNTER — Other Ambulatory Visit: Payer: Self-pay | Admitting: Cardiovascular Disease

## 2017-11-25 ENCOUNTER — Other Ambulatory Visit: Payer: Self-pay | Admitting: *Deleted

## 2017-11-25 ENCOUNTER — Other Ambulatory Visit: Payer: Self-pay | Admitting: Family Medicine

## 2017-11-25 ENCOUNTER — Encounter: Payer: Self-pay | Admitting: Family Medicine

## 2017-11-25 MED ORDER — CYMBALTA 60 MG PO CPEP: 60.0000 mg | ORAL_CAPSULE | Freq: Two times a day (BID) | ORAL | 1 refills | Status: DC

## 2017-11-25 MED ORDER — METOPROLOL SUCCINATE ER 100 MG PO TB24: ORAL_TABLET | ORAL | 1 refills | Status: DC

## 2017-11-25 MED ORDER — DULOXETINE HCL 60 MG PO CPEP: 60.0000 mg | ORAL_CAPSULE | Freq: Two times a day (BID) | ORAL | 1 refills | Status: DC

## 2017-11-25 MED ORDER — GABAPENTIN 800 MG PO TABS: 800.0000 mg | ORAL_TABLET | Freq: Two times a day (BID) | ORAL | 1 refills | Status: DC

## 2017-11-26 DIAGNOSIS — E870 Hyperosmolality and hypernatremia: Secondary | ICD-10-CM | POA: Diagnosis not present

## 2017-11-26 DIAGNOSIS — N183 Chronic kidney disease, stage 3 (moderate): Secondary | ICD-10-CM | POA: Diagnosis not present

## 2017-11-26 DIAGNOSIS — E559 Vitamin D deficiency, unspecified: Secondary | ICD-10-CM | POA: Diagnosis not present

## 2017-12-09 ENCOUNTER — Encounter: Payer: Self-pay | Admitting: *Deleted

## 2017-12-09 NOTE — Progress Notes (Unsigned)
Insurance verification submitted. Patient stated at last office visit that she had a paper approving Prolia. I am unaware of any prior authorization that has been submitted so I will need to look into this further.   She is currently on Evista for treatment and her most recent dexa scan T-score was only -1.

## 2017-12-12 NOTE — Progress Notes (Unsigned)
Summary of Benefits Received. 20% after deductible is met (Max deductible). Buy and bill.  $185)

## 2017-12-22 ENCOUNTER — Encounter: Payer: Self-pay | Admitting: Cardiovascular Disease

## 2017-12-23 ENCOUNTER — Other Ambulatory Visit: Payer: Self-pay

## 2017-12-23 MED ORDER — OMEGA-3-ACID ETHYL ESTERS 1 G PO CAPS: 2.0000 | ORAL_CAPSULE | Freq: Two times a day (BID) | ORAL | 3 refills | Status: DC

## 2017-12-29 ENCOUNTER — Telehealth: Payer: Self-pay | Admitting: *Deleted

## 2017-12-29 NOTE — Telephone Encounter (Signed)
PA submitted and approved for [employer] via Covermymeds  Case ID: [id number]

## 2018-01-26 ENCOUNTER — Telehealth: Payer: Self-pay | Admitting: Family Medicine

## 2018-01-27 DIAGNOSIS — R252 Cramp and spasm: Secondary | ICD-10-CM | POA: Diagnosis not present

## 2018-01-27 DIAGNOSIS — G4701 Insomnia due to medical condition: Secondary | ICD-10-CM | POA: Diagnosis not present

## 2018-01-27 DIAGNOSIS — Z79891 Long term (current) use of opiate analgesic: Secondary | ICD-10-CM | POA: Diagnosis not present

## 2018-01-27 DIAGNOSIS — M961 Postlaminectomy syndrome, not elsewhere classified: Secondary | ICD-10-CM | POA: Diagnosis not present

## 2018-01-28 ENCOUNTER — Encounter: Payer: Self-pay | Admitting: Cardiovascular Disease

## 2018-01-28 ENCOUNTER — Ambulatory Visit (INDEPENDENT_AMBULATORY_CARE_PROVIDER_SITE_OTHER): Payer: Medicare Other | Admitting: Cardiovascular Disease

## 2018-01-28 VITALS — BP 132/64 | HR 68 | Ht 65.0 in | Wt 211.0 lb

## 2018-01-28 DIAGNOSIS — F419 Anxiety disorder, unspecified: Secondary | ICD-10-CM | POA: Diagnosis not present

## 2018-01-28 DIAGNOSIS — E038 Other specified hypothyroidism: Secondary | ICD-10-CM | POA: Diagnosis not present

## 2018-01-28 DIAGNOSIS — N183 Chronic kidney disease, stage 3 unspecified: Secondary | ICD-10-CM

## 2018-01-28 DIAGNOSIS — K219 Gastro-esophageal reflux disease without esophagitis: Secondary | ICD-10-CM

## 2018-01-28 DIAGNOSIS — F329 Major depressive disorder, single episode, unspecified: Secondary | ICD-10-CM

## 2018-01-28 DIAGNOSIS — Z79899 Other long term (current) drug therapy: Secondary | ICD-10-CM

## 2018-01-28 DIAGNOSIS — I10 Essential (primary) hypertension: Secondary | ICD-10-CM

## 2018-01-28 DIAGNOSIS — F32A Depression, unspecified: Secondary | ICD-10-CM

## 2018-01-28 DIAGNOSIS — E782 Mixed hyperlipidemia: Secondary | ICD-10-CM

## 2018-01-28 DIAGNOSIS — M25473 Effusion, unspecified ankle: Secondary | ICD-10-CM | POA: Diagnosis not present

## 2018-01-28 NOTE — Progress Notes (Signed)
Patient ID: Regina Horton, female   DOB: [birth year], 67 y.o.   MRN: [id number]      Primary MD:  Dr. Tula Nakayama  HPI: Regina Horton is a 67 y.o. female who presents to the office today for an 8 month follow up cardiology evaluation.  Regina Horton has a history of palpitations which have been controlled with beta blocker therapy, hypertension, mixed hyperlipidemia, obesity, mild renal insufficiency, and chronic low back pain.  An echo Doppler study in 2010  showed mild aortic sclerosis without stenosis, mild mitral and calcification with mild MR, and mild TR, and normal systolic function.  A nuclear perfusion study showed normal perfusion.  She has a significant history of depression and takes Cymbalta, both for depression, as well as for chronic low back pain.  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  She walks with a cane.  Her low back pain has been debilitating.  She is follow-up Dr. Birdie Sons for renal insufficiency. She has had issues with chronic pain in particular back pain.  She has seen Dr. Jeanell Sparrow most for pain management.  She recently stopped taking her pain medications due to concerns at the were adversely affecting her.  She continues to have issues with depression and cries often, because her back hurts.  She also has noticed some palpitations.    When I saw her in January 2018, she  continued to have low back issues and was being evaluated by Dr. Ellene Route.  She is been evaluated by Dr. Ellene Route.She does experience frequent ankle swelling almost daily and occasionally this is more pronounced.  She denies any episodes of chest pain, palpitations, presyncope or syncope.  On 05/19/2017.  Creatinine was 1.5.  Hemoglobin 13.6.  Total cholesterol was 150, HDL 57, LDL 57, and triglycerides are mildly increased at 179.  She walks with a walker primarily due to knee discomfort.    Since I last  saw her in November 2018, she feels better since she was started on low-dose hydrochlorothiazide and has had improvement in her ankle edema.  Subsequent laboratory continue to show stable renal function.  She denies chest pain, PND, or orthopnea.   Past Medical History:  Diagnosis Date  . Allergic rhinitis   . Angina at rest [facility])   . Anxiety   . Aortic sclerosis   . Cataract 2016  . Chronic back pain   . Chronic constipation   . CKD (chronic kidney disease)   . Depression   . Fibromyalgia   . GERD (gastroesophageal reflux disease)   . Hyperlipidemia   . Hypertension   . Hypertension   . Mitral regurgitation   . Osteoporosis   . Palpitations   . Thyroid disease   . Tricuspid regurgitation     Past Surgical History:  Procedure Laterality Date  . BACK SURGERY  2005  . BREAST LUMPECTOMY  1970   right   . CATARACT EXTRACTION W/PHACO Right 06/15/2015   Procedure: CATARACT EXTRACTION PHACO AND INTRAOCULAR LENS PLACEMENT (IOC);  Surgeon: Tonny Branch, MD;  Location: [facility];  Service: Ophthalmology;  Laterality: Right;  CDE: 10.33  . CATARACT EXTRACTION W/PHACO Left 07/17/2015   Procedure: CATARACT EXTRACTION PHACO AND INTRAOCULAR LENS PLACEMENT (IOC);  Surgeon: Tonny Branch, MD;  Location: [facility];  Service: Ophthalmology;  Laterality: Left;  CDE:7.60  . NM MYOCAR PERF WALL MOTION  02/16/2009   normal  . ORIF  ANKLE FRACTURE Left 10/26/2014   Procedure: OPEN REDUCTION INTERNAL FIXATION (ORIF) ANKLE FRACTURE;  Surgeon: Sanjuana Kava, MD;  Location: [facility];  Service: Orthopedics;  Laterality: Left;  . TONSILLECTOMY  childhood   . TUBAL LIGATION  1973  . US ECHOCARDIOGRAPHY  12/19/2008   mild mitral annular ca+,AOV mildly sclerotic,mild MR & TR    Allergies  Allergen Reactions  . Xyzal [Levocetirizine Dihydrochloride] Hives and Itching  . Codeine     Pt states she does not have any problems with codeine.  . Nickel Rash    Current Outpatient Medications  Medication Sig Dispense Refill    . atorvastatin (LIPITOR) 40 MG tablet Take 1 tablet (40 mg total) by mouth daily. 30 tablet 6  . b complex vitamins tablet Take 1 tablet by mouth daily. 1000 units    . CARTIA XT 240 MG 24 hr capsule TAKE 1 CAPSULE BY MOUTH ONCE DAILY 90 capsule 2  . Cholecalciferol (VITAMIN D) 1000 UNITS capsule Take 2,000 Units by mouth 2 (two) times daily.     . DULoxetine (CYMBALTA) 60 MG capsule Take 1 capsule (60 mg total) by mouth 2 (two) times daily. 180 capsule 1  . fenofibrate 160 MG tablet TAKE ONE TABLET BY MOUTH DAILY 90 tablet 3  . fluticasone (FLONASE) 50 MCG/ACT nasal spray Place 2 sprays into both nostrils daily. 16 g 6  . gabapentin (NEURONTIN) 800 MG tablet Take 1 tablet (800 mg total) by mouth 2 (two) times daily. 180 tablet 1  . hydrOXYzine (VISTARIL) 25 MG capsule Take 25 mg by mouth 2 (two) times daily at 8 am and 10 pm.    . levothyroxine (SYNTHROID, LEVOTHROID) 50 MCG tablet TAKE ONE TABLET BY MOUTH ONCE DAILY 90 tablet 3  . metoprolol succinate (TOPROL-XL) 100 MG 24 hr tablet Take 1 tablet in the morning and 1/2 tablet in the evening. 135 tablet 1  . omega-3 acid ethyl esters (LOVAZA) 1 g capsule Take 2 capsules (2 g total) by mouth 2 (two) times daily. 180 capsule 3  . oxyCODONE-acetaminophen (PERCOCET) 7.5-325 MG tablet Take 1 tablet by mouth every 8 (eight) hours as needed.    . pantoprazole (PROTONIX) 40 MG tablet Take 1 tablet (40 mg total) by mouth daily. 90 tablet 1  . raloxifene (EVISTA) 60 MG tablet Take 1 tablet (60 mg total) by mouth daily. 90 tablet 0  . hydrochlorothiazide (MICROZIDE) 12.5 MG capsule Take 1 capsule (12.5 mg total) by mouth every other day. 45 capsule 3   No current facility-administered medications for this visit.     Social History   Socioeconomic History  . Marital status: Married    Spouse name: Not on file  . Number of children: 2  . Years of education: Not on file  . Highest education level: Not on file  Occupational History  . Occupation:  disabled   Social Needs  . Financial resource strain: Not on file  . Food insecurity:    Worry: Not on file    Inability: Not on file  . Transportation needs:    Medical: Not on file    Non-medical: Not on file  Tobacco Use  . Smoking status: Former Smoker    Packs/day: 1.00    Years: 20.00    Pack years: 20.00    Types: Cigarettes    Last attempt to quit: 12/24/2003    Years since quitting: 14.1  . Smokeless tobacco: Never Used  Substance and Sexual Activity  . Alcohol use: No  .  Drug use: Yes    Types: Marijuana    Comment: boils marijuana in water and makes a tea about 3 times a week  . Sexual activity: Not Currently    Birth control/protection: Surgical  Lifestyle  . Physical activity:    Days per week: Not on file    Minutes per session: Not on file  . Stress: Not on file  Relationships  . Social connections:    Talks on phone: Not on file    Gets together: Not on file    Attends religious service: Not on file    Active member of club or organization: Not on file    Attends meetings of clubs or organizations: Not on file    Relationship status: Not on file  . Intimate partner violence:    Fear of current or ex partner: Not on file    Emotionally abused: Not on file    Physically abused: Not on file    Forced sexual activity: Not on file  Other Topics Concern  . Not on file  Social History Narrative  . Not on file    Family History  Problem Relation Age of Onset  . Rheum arthritis Mother   . Osteoporosis Mother   . Heart disease Father   . Diabetes Father   . Hyperlipidemia Father   . Bladder Cancer Father   . Heart disease Brother   . Arthritis Brother     ROS General: Negative; No fevers, chills, or night sweats HEENT: Negative; No changes in vision or hearing, sinus congestion, difficulty swallowing Pulmonary: Negative; No cough, wheezing, shortness of breath, hemoptysis Cardiovascular: See HPI: No chest pain, presyncope, syncope,  palpitations Positive for occasional ankle swelling GI: Positive for GERD. No nausea, vomiting, diarrhea, or abdominal pain GU: Negative; No dysuria, hematuria, or difficulty voiding Musculoskeletal: Positive for chronic low back pain and knee discomfort Hematologic: Negative; no easy bruising, bleeding Endocrine: Positive for hypothyroidism.  No diabetes Neuro: Negative; no changes in balance, headaches Skin: Negative; No rashes or skin lesions Psychiatric: Positive for depression and anxiety Sleep: Negative; No snoring,  daytime sleepiness, hypersomnolence, bruxism, restless legs, hypnogognic hallucinations. Other comprehensive 14 point system review is negative   Physical Exam BP 132/64 (BP Location: Left Arm, Patient Position: Sitting, Cuff Size: Large)   Pulse 68   Ht '5\' 5"'  (1.651 m)   Wt 211 lb (95.7 kg)   BMI 35.11 kg/m    Repeat blood pressure by me was 118/70.  Wt Readings from Last 3 Encounters:  01/28/18 211 lb (95.7 kg)  11/18/17 207 lb 3.2 oz (94 kg)  08/29/17 196 lb 12.8 oz (89.3 kg)    General: Alert, oriented, no distress.  Skin: normal turgor, no rashes, warm and dry HEENT: Normocephalic, atraumatic. Pupils equal round and reactive to light; sclera anicteric; extraocular muscles intact;  Nose without nasal septal hypertrophy Mouth/Parynx benign; Mallinpatti scale 3 Neck: No JVD, no carotid bruits; normal carotid upstroke Lungs: clear to ausculatation and percussion; no wheezing or rales Chest wall: without tenderness to palpitation Heart: PMI not displaced, RRR, s1 s2 normal, 1/6 systolic murmur, no diastolic murmur, no rubs, gallops, thrills, or heaves Abdomen: soft, nontender; no hepatosplenomehaly, BS+; abdominal aorta nontender and not dilated by palpation. Back: no CVA tenderness Pulses 2+ Musculoskeletal: full range of motion, normal strength, no joint deformities Extremities: Trivial ankle swelling;  no clubbing cyanosis or edema, Homan's sign  negative  Neurologic: grossly nonfocal; Cranial nerves grossly wnl Psychologic: Normal mood and affect  ECG (independently read by me): Sinus rhythm at 68 bpm.  Nonspecific ST-T changes.  Incomplete right bundle branch block.  July 17, 2017 ECG (independently read by me): Sinus bradycardia 55 bpm.  Nonspecific ST changes.  Normal intervals.  No ectopy.  09/17/2016 ECG (independently read by me): Sinus rhythm with incomplete right bundle branch block.  Nonspecific ST-T changes.  QTc interval 431 Regina, PR interval 156 Regina.  September 14 2015 ECG (independently read by me):  Sinus bradycardia 57 bpm.  Incomplete right bundle branch block.  September 2015ECG (independently read by me): Normal sinus rhythm with incomplete right bundle branch block.  LABS:  BMP Latest Ref Rng & Units 01/01/2017 09/28/2015 06/09/2015  Glucose 65 - 99 mg/dL 111(H) 96 91  BUN 8 - 27 mg/dL 18 19 21(H)  Creatinine 0.57 - 1.00 mg/dL 1.32(H) 1.47(H) 1.45(H)  BUN/Creat Ratio 12 - '28 14 13 ' -  Sodium 134 - 144 mmol/L 144 146(H) 142  Potassium 3.5 - 5.2 mmol/L 4.7 4.6 3.8  Chloride 96 - 106 mmol/L 103 105 106  CO2 18 - 29 mmol/L '23 27 30  ' Calcium 8.7 - 10.3 mg/dL 10.2 9.7 9.6   Hepatic Function Latest Ref Rng & Units 01/01/2017 09/28/2015 05/12/2015  Total Protein 6.0 - 8.5 g/dL 7.0 6.1 6.4  Albumin 3.6 - 4.8 g/dL 4.5 3.7 4.1  AST 0 - 40 IU/L '18 20 21  ' ALT 0 - 32 IU/L '10 6 9  ' Alk Phosphatase 39 - 117 IU/L 46 46 42  Total Bilirubin 0.0 - 1.2 mg/dL 0.5 0.3 0.5  Bilirubin, Direct 0.0 - 0.3 mg/dL - - -   CBC Latest Ref Rng & Units 09/28/2015 06/09/2015 01/09/2015  WBC 3.4 - 10.8 x10E3/uL 8.2 9.8 12.4(H)  Hemoglobin 11.1 - 15.9 g/dL 12.2 13.3 12.2  Hematocrit 34.0 - 46.6 % 36.7 39.4 35.8(L)  Platelets 150 - 379 x10E3/uL 417(H) 292 346   Lab Results  Component Value Date   MCV 92 09/28/2015   MCV 96.3 06/09/2015   MCV 91.1 01/09/2015   Lab Results  Component Value Date   TSH 3.94 08/29/2017   Lab Results   Component Value Date   HGBA1C 4.8 07/07/2014   Lipid Panel     Component Value Date/Time   CHOL 150 01/01/2017 1429   TRIG 179 (H) 01/01/2017 1429   HDL 57 01/01/2017 1429   CHOLHDL 3.0 05/12/2015 1459   VLDL 30 05/12/2015 1459   LDLCALC 57 01/01/2017 1429    RADIOLOGY: No results found.  IMPRESSION: 1. Essential hypertension   2. Mixed hyperlipidemia   3. Chronic kidney disease, stage 3 (moderate) (HCC)   4. Other specified hypothyroidism   5. Medication management   6. Ankle edema   7. Gastroesophageal reflux disease without esophagitis   8. Anxiety and depression     ASSESSMENT AND PLAN: Regina Horton is a 67 year old female who has a history of depression and continues to take  Cymbalta 60 mg daily.  In the past she had frequent crying spells but these have improved.  Her blood pressure is well controlled today on Cartia XT 240 mg daily, HCTZ 12.5 mg every other day, and Toprol-XL 100 mg in the morning and 50 mg in the evening.  When I last saw her, she had some issues with leg swelling.  This has improved with every other day HCTZ 12.5 mg.  Her most recent renal function also has stabilized creatinine has improved to 1.32.  She continues to be on  atorvastatin 40 mg and fenofibrate 160 mg for her mixed hyperlipidemia.  She has hypothyroidism on levothyroxine 50 mcg.  Her depression seems improved on her current dose of Cymbalta.  She has not had significant GERD and is tolerating Protonix.  I will recheck fasting labs and contact her for medication adjustment if necessary.  I will see her in 6 months for reevaluation.  Time spent: 25 minutes  Troy Sine, MD, [facility]  01/29/2018 6:28 PM

## 2018-01-28 NOTE — Patient Instructions (Signed)
Medication Instructions:  Your physician recommends that you continue on your current medications as directed. Please refer to the Current Medication list given to you today.  Labwork: Please return for FASTING labs (CMET, CBC, Lipid, TSH)  Our in office lab hours are Monday-Friday 8:00-4:00, closed for lunch 12:45-1:45 pm.  No appointment needed.  Follow-Up: Your physician wants you to follow-up in: 6 months with Dr. Kelly. You will receive a reminder letter in the mail two months in advance. If you don't receive a letter, please call our office to schedule the follow-up appointment.   Any Other Special Instructions Will Be Listed Below (If Applicable).     If you need a refill on your cardiac medications before your next appointment, please call your pharmacy.   

## 2018-01-29 ENCOUNTER — Encounter: Payer: Self-pay | Admitting: Cardiovascular Disease

## 2018-02-05 ENCOUNTER — Encounter: Payer: Self-pay | Admitting: Family Medicine

## 2018-02-06 NOTE — Telephone Encounter (Signed)
   Patient has 100% coverage with with medicap coverage.  Patient aware and Prolia ordered from specialty pharmacy.  Appointment scheduled for next week.

## 2018-02-12 ENCOUNTER — Ambulatory Visit (INDEPENDENT_AMBULATORY_CARE_PROVIDER_SITE_OTHER): Payer: Medicare Other | Admitting: *Deleted

## 2018-02-12 DIAGNOSIS — M810 Age-related osteoporosis without current pathological fracture: Secondary | ICD-10-CM

## 2018-02-12 MED ORDER — DENOSUMAB 60 MG/ML ~~LOC~~ SOSY
60.0000 mg | PREFILLED_SYRINGE | Freq: Once | SUBCUTANEOUS | Status: AC
  Administered 2018-02-12: 60 mg via SUBCUTANEOUS

## 2018-02-12 NOTE — Progress Notes (Signed)
Pt give Prolia inj Tolerated well Next due 08/15/2018

## 2018-02-13 DIAGNOSIS — M5415 Radiculopathy, thoracolumbar region: Secondary | ICD-10-CM | POA: Diagnosis not present

## 2018-02-13 DIAGNOSIS — M545 Low back pain: Secondary | ICD-10-CM | POA: Diagnosis not present

## 2018-02-13 DIAGNOSIS — M961 Postlaminectomy syndrome, not elsewhere classified: Secondary | ICD-10-CM | POA: Diagnosis not present

## 2018-03-05 DIAGNOSIS — E038 Other specified hypothyroidism: Secondary | ICD-10-CM | POA: Diagnosis not present

## 2018-03-05 DIAGNOSIS — R809 Proteinuria, unspecified: Secondary | ICD-10-CM | POA: Diagnosis not present

## 2018-03-05 DIAGNOSIS — D509 Iron deficiency anemia, unspecified: Secondary | ICD-10-CM | POA: Diagnosis not present

## 2018-03-05 DIAGNOSIS — Z79899 Other long term (current) drug therapy: Secondary | ICD-10-CM | POA: Diagnosis not present

## 2018-03-05 DIAGNOSIS — I10 Essential (primary) hypertension: Secondary | ICD-10-CM | POA: Diagnosis not present

## 2018-03-05 DIAGNOSIS — N183 Chronic kidney disease, stage 3 (moderate): Secondary | ICD-10-CM | POA: Diagnosis not present

## 2018-03-05 DIAGNOSIS — E782 Mixed hyperlipidemia: Secondary | ICD-10-CM | POA: Diagnosis not present

## 2018-03-05 DIAGNOSIS — E559 Vitamin D deficiency, unspecified: Secondary | ICD-10-CM | POA: Diagnosis not present

## 2018-03-06 LAB — COMPREHENSIVE METABOLIC PANEL
ALT: 15 IU/L (ref 0–32)
AST: 30 IU/L (ref 0–40)
Albumin/Globulin Ratio: 2 (ref 1.2–2.2)
Albumin: 4.6 g/dL (ref 3.6–4.8)
Alkaline Phosphatase: 47 IU/L (ref 39–117)
BUN/Creatinine Ratio: 16 (ref 12–28)
BUN: 28 mg/dL — AB (ref 8–27)
Bilirubin Total: 0.4 mg/dL (ref 0.0–1.2)
CO2: 24 mmol/L (ref 20–29)
Calcium: 10.2 mg/dL (ref 8.7–10.3)
Chloride: 104 mmol/L (ref 96–106)
Creatinine, Ser: 1.79 mg/dL — ABNORMAL HIGH (ref 0.57–1.00)
GFR calc Af Amer: 33 mL/min/{1.73_m2} — ABNORMAL LOW (ref >59)
GFR, EST NON AFRICAN AMERICAN: 29 mL/min/{1.73_m2} — AB (ref >59)
GLUCOSE: 98 mg/dL (ref 65–99)
Globulin, Total: 2.3 g/dL (ref 1.5–4.5)
Potassium: 5.3 mmol/L — ABNORMAL HIGH (ref 3.5–5.2)
Sodium: 144 mmol/L (ref 134–144)
Total Protein: 6.9 g/dL (ref 6.0–8.5)

## 2018-03-06 LAB — LIPID PANEL
Chol/HDL Ratio: 2.2 ratio (ref 0.0–4.4)
Cholesterol, Total: 142 mg/dL (ref 100–199)
HDL: 65 mg/dL (ref >39)
LDL Calculated: 53 mg/dL (ref 0–99)
Triglycerides: 118 mg/dL (ref 0–149)
VLDL Cholesterol Cal: 24 mg/dL (ref 5–40)

## 2018-03-06 LAB — CBC
HEMOGLOBIN: 13.2 g/dL (ref 11.1–15.9)
Hematocrit: 41.3 % (ref 34.0–46.6)
MCH: 29.7 pg (ref 26.6–33.0)
MCHC: 32 g/dL (ref 31.5–35.7)
MCV: 93 fL (ref 79–97)
Platelets: 380 10*3/uL (ref 150–450)
RBC: 4.44 x10E6/uL (ref 3.77–5.28)
RDW: 14.8 % (ref 12.3–15.4)
WBC: 7 10*3/uL (ref 3.4–10.8)

## 2018-03-06 LAB — TSH: TSH: 4.02 u[IU]/mL (ref 0.450–4.500)

## 2018-03-11 DIAGNOSIS — E669 Obesity, unspecified: Secondary | ICD-10-CM | POA: Diagnosis not present

## 2018-03-11 DIAGNOSIS — N183 Chronic kidney disease, stage 3 (moderate): Secondary | ICD-10-CM | POA: Diagnosis not present

## 2018-03-11 DIAGNOSIS — E870 Hyperosmolality and hypernatremia: Secondary | ICD-10-CM | POA: Diagnosis not present

## 2018-03-11 DIAGNOSIS — E559 Vitamin D deficiency, unspecified: Secondary | ICD-10-CM | POA: Diagnosis not present

## 2018-03-12 ENCOUNTER — Telehealth: Payer: Self-pay | Admitting: Cardiovascular Disease

## 2018-03-12 NOTE — Telephone Encounter (Signed)
Notes recorded by Troy Sine, MD on 03/09/2018 at 8:03 AM EDT The patient is followed by Dr. Lowanda Foster in [place] for her renal insufficiency. Creatinine is increased to 1.79 from 1.3 to 1-year ago. May need to hold HCTZ to reduce frequency. Recommend follow-up with Dr. Lowanda Foster   Patient aware and states Dr. Lowanda Foster is aware and is following this frequently.

## 2018-03-12 NOTE — Telephone Encounter (Signed)
New message ° ° °Pt calling for lab results °

## 2018-03-20 ENCOUNTER — Encounter: Payer: Self-pay | Admitting: Cardiovascular Disease

## 2018-03-23 ENCOUNTER — Other Ambulatory Visit: Payer: Self-pay

## 2018-03-23 MED ORDER — ATORVASTATIN CALCIUM 40 MG PO TABS: 40.0000 mg | ORAL_TABLET | Freq: Every day | ORAL | 3 refills | Status: DC

## 2018-03-23 NOTE — Progress Notes (Signed)
90 day supply of Lipitor sent in per pt request.

## 2018-03-25 DIAGNOSIS — M5415 Radiculopathy, thoracolumbar region: Secondary | ICD-10-CM | POA: Diagnosis not present

## 2018-03-25 DIAGNOSIS — M961 Postlaminectomy syndrome, not elsewhere classified: Secondary | ICD-10-CM | POA: Diagnosis not present

## 2018-03-25 DIAGNOSIS — G4701 Insomnia due to medical condition: Secondary | ICD-10-CM | POA: Diagnosis not present

## 2018-03-25 DIAGNOSIS — Z79891 Long term (current) use of opiate analgesic: Secondary | ICD-10-CM | POA: Diagnosis not present

## 2018-03-25 DIAGNOSIS — R252 Cramp and spasm: Secondary | ICD-10-CM | POA: Diagnosis not present

## 2018-04-23 ENCOUNTER — Other Ambulatory Visit: Payer: Self-pay | Admitting: Family Medicine

## 2018-04-24 ENCOUNTER — Other Ambulatory Visit: Payer: Self-pay

## 2018-04-24 MED ORDER — HYDROCHLOROTHIAZIDE 12.5 MG PO CAPS: 12.5000 mg | ORAL_CAPSULE | ORAL | 3 refills | Status: DC

## 2018-04-24 MED ORDER — OMEGA-3-ACID ETHYL ESTERS 1 G PO CAPS: 2.0000 | ORAL_CAPSULE | Freq: Two times a day (BID) | ORAL | 3 refills | Status: DC

## 2018-05-28 ENCOUNTER — Other Ambulatory Visit: Payer: Self-pay | Admitting: Cardiovascular Disease

## 2018-05-28 ENCOUNTER — Other Ambulatory Visit: Payer: Self-pay | Admitting: Family Medicine

## 2018-05-28 NOTE — Telephone Encounter (Signed)
Last seen 11/18/17  Dr Darnell Level

## 2018-05-30 ENCOUNTER — Other Ambulatory Visit: Payer: Self-pay | Admitting: Family Medicine

## 2018-06-01 ENCOUNTER — Encounter: Payer: Self-pay | Admitting: Family Medicine

## 2018-06-01 ENCOUNTER — Other Ambulatory Visit: Payer: Self-pay

## 2018-06-01 NOTE — Telephone Encounter (Signed)
Last seen 11/18/17

## 2018-06-10 ENCOUNTER — Other Ambulatory Visit: Payer: Self-pay | Admitting: Cardiovascular Disease

## 2018-06-12 DIAGNOSIS — H401130 Primary open-angle glaucoma, bilateral, stage unspecified: Secondary | ICD-10-CM | POA: Diagnosis not present

## 2018-06-12 DIAGNOSIS — H52221 Regular astigmatism, right eye: Secondary | ICD-10-CM | POA: Diagnosis not present

## 2018-06-12 DIAGNOSIS — H40009 Preglaucoma, unspecified, unspecified eye: Secondary | ICD-10-CM | POA: Diagnosis not present

## 2018-06-14 ENCOUNTER — Encounter: Payer: Self-pay | Admitting: Family Medicine

## 2018-06-22 ENCOUNTER — Telehealth: Payer: Self-pay | Admitting: Family Medicine

## 2018-06-22 DIAGNOSIS — N183 Chronic kidney disease, stage 3 (moderate): Secondary | ICD-10-CM | POA: Diagnosis not present

## 2018-06-22 DIAGNOSIS — M961 Postlaminectomy syndrome, not elsewhere classified: Secondary | ICD-10-CM | POA: Diagnosis not present

## 2018-06-22 DIAGNOSIS — Z23 Encounter for immunization: Secondary | ICD-10-CM | POA: Diagnosis not present

## 2018-06-22 DIAGNOSIS — R252 Cramp and spasm: Secondary | ICD-10-CM | POA: Diagnosis not present

## 2018-06-22 DIAGNOSIS — G4701 Insomnia due to medical condition: Secondary | ICD-10-CM | POA: Diagnosis not present

## 2018-06-22 DIAGNOSIS — D509 Iron deficiency anemia, unspecified: Secondary | ICD-10-CM | POA: Diagnosis not present

## 2018-06-22 DIAGNOSIS — R809 Proteinuria, unspecified: Secondary | ICD-10-CM | POA: Diagnosis not present

## 2018-06-22 DIAGNOSIS — I10 Essential (primary) hypertension: Secondary | ICD-10-CM | POA: Diagnosis not present

## 2018-06-22 DIAGNOSIS — Z79891 Long term (current) use of opiate analgesic: Secondary | ICD-10-CM | POA: Diagnosis not present

## 2018-06-22 DIAGNOSIS — E559 Vitamin D deficiency, unspecified: Secondary | ICD-10-CM | POA: Diagnosis not present

## 2018-06-22 DIAGNOSIS — Z79899 Other long term (current) drug therapy: Secondary | ICD-10-CM | POA: Diagnosis not present

## 2018-06-22 NOTE — Telephone Encounter (Signed)
Pt notified Prolia inj due 08/15/2018

## 2018-07-01 DIAGNOSIS — E559 Vitamin D deficiency, unspecified: Secondary | ICD-10-CM | POA: Diagnosis not present

## 2018-07-01 DIAGNOSIS — N184 Chronic kidney disease, stage 4 (severe): Secondary | ICD-10-CM | POA: Diagnosis not present

## 2018-07-28 ENCOUNTER — Encounter: Payer: Self-pay | Admitting: Cardiovascular Disease

## 2018-07-28 ENCOUNTER — Ambulatory Visit (INDEPENDENT_AMBULATORY_CARE_PROVIDER_SITE_OTHER): Payer: Medicare Other | Admitting: Cardiovascular Disease

## 2018-07-28 VITALS — BP 116/78 | HR 65 | Ht 65.0 in | Wt 220.2 lb

## 2018-07-28 DIAGNOSIS — E782 Mixed hyperlipidemia: Secondary | ICD-10-CM | POA: Diagnosis not present

## 2018-07-28 DIAGNOSIS — F419 Anxiety disorder, unspecified: Secondary | ICD-10-CM

## 2018-07-28 DIAGNOSIS — K219 Gastro-esophageal reflux disease without esophagitis: Secondary | ICD-10-CM

## 2018-07-28 DIAGNOSIS — F32A Depression, unspecified: Secondary | ICD-10-CM

## 2018-07-28 DIAGNOSIS — M549 Dorsalgia, unspecified: Secondary | ICD-10-CM | POA: Diagnosis not present

## 2018-07-28 DIAGNOSIS — I10 Essential (primary) hypertension: Secondary | ICD-10-CM

## 2018-07-28 DIAGNOSIS — E039 Hypothyroidism, unspecified: Secondary | ICD-10-CM

## 2018-07-28 DIAGNOSIS — E669 Obesity, unspecified: Secondary | ICD-10-CM

## 2018-07-28 DIAGNOSIS — F329 Major depressive disorder, single episode, unspecified: Secondary | ICD-10-CM | POA: Diagnosis not present

## 2018-07-28 DIAGNOSIS — N183 Chronic kidney disease, stage 3 unspecified: Secondary | ICD-10-CM

## 2018-07-28 DIAGNOSIS — G8929 Other chronic pain: Secondary | ICD-10-CM | POA: Diagnosis not present

## 2018-07-28 NOTE — Progress Notes (Signed)
Patient ID: Regina Horton, female   DOB: 07-20-1951, 67 y.o.   MRN: [id number]      Primary MD:  Dr. Tula Nakayama  HPI: Regina Horton is a 67 y.o. female who presents to the office today for a 6 month follow up cardiology evaluation.  Ms Hazelett has a history of palpitations which have been controlled with beta blocker therapy, hypertension, mixed hyperlipidemia, obesity, mild renal insufficiency, and chronic low back pain.  An echo Doppler study in 2010  showed mild aortic sclerosis without stenosis, mild mitral and calcification with mild MR, and mild TR, and normal systolic function.  A nuclear perfusion study showed normal perfusion.  She has a significant history of depression and takes Cymbalta, both for depression, as well as for chronic low back pain.  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  She walks with a cane.  Her low back pain has been debilitating.  She is follow-up Dr. Birdie Sons for renal insufficiency. She has had issues with chronic pain in particular back pain.  She has seen Dr. Jeanell Sparrow most for pain management.  She recently stopped taking her pain medications due to concerns at the were adversely affecting her.  She continues to have issues with depression and cries often, because her back hurts.  She also has noticed some palpitations.    When I saw her in January 2018, she  continued to have low back issues and was being evaluated by Dr. Ellene Route.  She is been evaluated by Dr. Ellene Route.She does experience frequent ankle swelling almost daily and occasionally this is more pronounced.  She denies any episodes of chest pain, palpitations, presyncope or syncope.  On 05/19/2017: Creatinine was 1.5.  Hemoglobin 13.6.  Total cholesterol was 150, HDL 57, LDL 57, and triglycerides are mildly increased at 179.  She walks with a walker primarily due to knee discomfort.    When I last saw  her in June 2019 she was feeling well on low-dose hydrochlorothiazide and had improvement in her ankle edema.  Laboratory tended to show stable renal function.  Since I last saw her, she has felt well.  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  She has experienced rare nonexertional episodes of chest pain which are atypical and not representative of angina.  Her last creatinine in July 2019 was 1.7.  Lipid studies revealed total cholesterol 142, HDL 65, LDL 53, and triglycerides 118.  She is followed by Dr. Lowanda Foster with reference to her renal disease.  She presents for evaluation.  Past Medical History:  Diagnosis Date  . Allergic rhinitis   . [facility]   . Anxiety   . Aortic sclerosis   . Cataract 2016  . Chronic back pain   . Chronic constipation   . CKD (chronic kidney disease)   . Depression   . Fibromyalgia   . GERD (gastroesophageal reflux disease)   . Hyperlipidemia   . Hypertension   . Hypertension   . Mitral regurgitation   . Osteoporosis   . Palpitations   . Thyroid disease   . Tricuspid regurgitation     Past Surgical History:  Procedure Laterality Date  . BACK SURGERY  2005  . BREAST LUMPECTOMY  1970   right   . CATARACT EXTRACTION W/PHACO Right 06/15/2015   Procedure: CATARACT EXTRACTION PHACO AND  INTRAOCULAR LENS PLACEMENT (IOC);  Surgeon: Tonny Branch, MD;  Location: [facility];  Service: Ophthalmology;  Laterality: Right;  CDE: 10.33  . CATARACT EXTRACTION W/PHACO Left 07/17/2015   Procedure: CATARACT EXTRACTION PHACO AND INTRAOCULAR LENS PLACEMENT (IOC);  Surgeon: Tonny Branch, MD;  Location: [facility];  Service: Ophthalmology;  Laterality: Left;  CDE:7.60  . NM MYOCAR PERF WALL MOTION  02/16/2009   normal  . ORIF ANKLE FRACTURE Left 10/26/2014   Procedure: OPEN REDUCTION INTERNAL FIXATION (ORIF) ANKLE FRACTURE;  Surgeon: Sanjuana Kava, MD;  Location: [facility];  Service: Orthopedics;   Laterality: Left;  . TONSILLECTOMY  childhood   . TUBAL LIGATION  1973  . US ECHOCARDIOGRAPHY  12/19/2008   mild mitral annular ca+,AOV mildly sclerotic,mild MR & TR    Allergies  Allergen Reactions  . Xyzal [Levocetirizine Dihydrochloride] Hives and Itching  . Codeine     Pt states she does not have any problems with codeine.  . Nickel Rash    Current Outpatient Medications  Medication Sig Dispense Refill  . atorvastatin (LIPITOR) 40 MG tablet Take 1 tablet (40 mg total) by mouth daily. 90 tablet 3  . b complex vitamins tablet Take 1 tablet by mouth daily. 1000 units    . Carboxymethylcellul-Glycerin (REFRESH RELIEVA OP) Apply 1 drop to eye. 6 times per day    . CARTIA XT 240 MG 24 hr capsule TAKE 1 CAPSULE BY MOUTH ONCE DAILY 90 capsule 2  . Cholecalciferol (VITAMIN D) 1000 UNITS capsule Take 2,000 Units by mouth 2 (two) times daily.     Marland Kitchen denosumab (PROLIA) 60 MG/ML SOSY injection Inject 60 mg into the skin every 6 (six) months.    . DULoxetine (CYMBALTA) 60 MG capsule TAKE 1 CAPSULE BY MOUTH TWICE DAILY 180 capsule 0  . fenofibrate 160 MG tablet TAKE ONE TABLET BY MOUTH DAILY 90 tablet 3  . fluticasone (FLONASE) 50 MCG/ACT nasal spray Place 2 sprays into both nostrils daily. 16 g 6  . furosemide (LASIX) 20 MG tablet Take 20 mg by mouth daily as needed. Pt taking every 3 days.  3  . gabapentin (NEURONTIN) 800 MG tablet TAKE 1 TABLET BY MOUTH TWICE DAILY 180 tablet 0  . hydrOXYzine (VISTARIL) 25 MG capsule Take 25 mg by mouth 2 (two) times daily at 8 am and 10 pm.    . levothyroxine (SYNTHROID, LEVOTHROID) 50 MCG tablet TAKE ONE TABLET BY MOUTH ONCE DAILY 90 tablet 3  . metoprolol succinate (TOPROL-XL) 100 MG 24 hr tablet TAKE 1 TABLET BY MOUTH IN THE MORNING AND 1/2 (ONE-HALF) TABLET IN THE EVENING 135 tablet 1  . omega-3 acid ethyl esters (LOVAZA) 1 g capsule Take 2 capsules (2 g total) by mouth 2 (two) times daily. 180 capsule 3  . oxyCODONE-acetaminophen (PERCOCET) 7.5-325 MG  tablet Take 1 tablet by mouth every 8 (eight) hours as needed.    . pantoprazole (PROTONIX) 40 MG tablet TAKE 1 TABLET BY MOUTH ONCE DAILY 90 tablet 0   No current facility-administered medications for this visit.     Social History   Socioeconomic History  . Marital status: Married    Spouse name: Not on file  . Number of children: 2  . Years of education: Not on file  . Highest education level: Not on file  Occupational History  . Occupation: disabled   Social Needs  . Financial resource strain: Not on file  . Food insecurity:    Worry: Not on file    Inability: Not  on file  . Transportation needs:    Medical: Not on file    Non-medical: Not on file  Tobacco Use  . Smoking status: Former Smoker    Packs/day: 1.00    Years: 20.00    Pack years: 20.00    Types: Cigarettes    Last attempt to quit: 12/24/2003    Years since quitting: 14.6  . Smokeless tobacco: Never Used  Substance and Sexual Activity  . Alcohol use: No  . Drug use: Yes    Types: Marijuana    Comment: boils marijuana in water and makes a tea about 3 times a week  . Sexual activity: Not Currently    Birth control/protection: Surgical  Lifestyle  . Physical activity:    Days per week: Not on file    Minutes per session: Not on file  . Stress: Not on file  Relationships  . Social connections:    Talks on phone: Not on file    Gets together: Not on file    Attends religious service: Not on file    Active member of club or organization: Not on file    Attends meetings of clubs or organizations: Not on file    Relationship status: Not on file  . Intimate partner violence:    Fear of current or ex partner: Not on file    Emotionally abused: Not on file    Physically abused: Not on file    Forced sexual activity: Not on file  Other Topics Concern  . Not on file  Social History Narrative  . Not on file    Family History  Problem Relation Age of Onset  . Rheum arthritis Mother   . Osteoporosis  Mother   . Heart disease Father   . Diabetes Father   . Hyperlipidemia Father   . Bladder Cancer Father   . Heart disease Brother   . Arthritis Brother     ROS General: Negative; No fevers, chills, or night sweats HEENT: Negative; No changes in vision or hearing, sinus congestion, difficulty swallowing Pulmonary: Negative; No cough, wheezing, shortness of breath, hemoptysis Cardiovascular: See HPI: No chest pain, presyncope, syncope, palpitations Positive for occasional ankle swelling GI: Positive for GERD. No nausea, vomiting, diarrhea, or abdominal pain GU: Negative; No dysuria, hematuria, or difficulty voiding Musculoskeletal: Positive for chronic low back pain and knee discomfort Hematologic: Negative; no easy bruising, bleeding Endocrine: Positive for hypothyroidism.  No diabetes Neuro: Negative; no changes in balance, headaches Skin: Negative; No rashes or skin lesions Psychiatric: Positive for depression and anxiety Sleep: Negative; No snoring,  daytime sleepiness, hypersomnolence, bruxism, restless legs, hypnogognic hallucinations. Other comprehensive 14 point system review is negative   Physical Exam BP 116/78   Pulse 65   Ht '5\' 5"'  (1.651 m)   Wt 220 lb 3.2 oz (99.9 kg)   BMI 36.64 kg/m    Repeat blood pressure by me was 120/74  Wt Readings from Last 3 Encounters:  07/28/18 220 lb 3.2 oz (99.9 kg)  01/28/18 211 lb (95.7 kg)  11/18/17 207 lb 3.2 oz (94 kg)   General: Alert, oriented, no distress.  Skin: normal turgor, no rashes, warm and dry HEENT: Normocephalic, atraumatic. Pupils equal round and reactive to light; sclera anicteric; extraocular muscles intact;  Nose without nasal septal hypertrophy Mouth/Parynx benign; Mallinpatti scale 3 Neck: No JVD, no carotid bruits; normal carotid upstroke Lungs: clear to ausculatation and percussion; no wheezing or rales Chest wall: without tenderness to palpitation Heart: PMI not  displaced, RRR, s1 s2 normal, 1/6  systolic murmur, no diastolic murmur, no rubs, gallops, thrills, or heaves Abdomen: soft, nontender; no hepatosplenomehaly, BS+; abdominal aorta nontender and not dilated by palpation. Back: no CVA tenderness Pulses 2+ Musculoskeletal: full range of motion, normal strength, no joint deformities Extremities: no clubbing cyanosis or edema, Homan's sign negative  Neurologic: grossly nonfocal; Cranial nerves grossly wnl Psychologic: Normal mood and affect   ECG (independently read by me): Normal sinus rhythm at 65 bpm.  Incomplete right bundle branch block.  Anterior nonspecific ST-T changes  January 28, 2018 ECG (independently read by me): Sinus rhythm at 68 bpm.  Nonspecific ST-T changes.  Incomplete right bundle branch block.  July 17, 2017 ECG (independently read by me): Sinus bradycardia 55 bpm.  Nonspecific ST changes.  Normal intervals.  No ectopy.  09/17/2016 ECG (independently read by me): Sinus rhythm with incomplete right bundle branch block.  Nonspecific ST-T changes.  QTc interval 431 ms, PR interval 156 ms.  September 14 2015 ECG (independently read by me):  Sinus bradycardia 57 bpm.  Incomplete right bundle branch block.  September 2015ECG (independently read by me): Normal sinus rhythm with incomplete right bundle branch block.  LABS:  BMP Latest Ref Rng & Units 03/05/2018 01/01/2017 09/28/2015  Glucose 65 - 99 mg/dL 98 111(H) 96  BUN 8 - 27 mg/dL 28(H) 18 19  Creatinine 0.57 - 1.00 mg/dL 1.79(H) 1.32(H) 1.47(H)  BUN/Creat Ratio 12 - '28 16 14 13  ' Sodium 134 - 144 mmol/L 144 144 146(H)  Potassium 3.5 - 5.2 mmol/L 5.3(H) 4.7 4.6  Chloride 96 - 106 mmol/L 104 103 105  CO2 20 - 29 mmol/L '24 23 27  ' Calcium 8.7 - 10.3 mg/dL 10.2 10.2 9.7   Hepatic Function Latest Ref Rng & Units 03/05/2018 01/01/2017 09/28/2015  Total Protein 6.0 - 8.5 g/dL 6.9 7.0 6.1  Albumin 3.6 - 4.8 g/dL 4.6 4.5 3.7  AST 0 - 40 IU/L '30 18 20  ' ALT 0 - 32 IU/L '15 10 6  ' Alk Phosphatase 39 - 117 IU/L 47 46 46    Total Bilirubin 0.0 - 1.2 mg/dL 0.4 0.5 0.3  Bilirubin, Direct 0.0 - 0.3 mg/dL - - -   CBC Latest Ref Rng & Units 03/05/2018 09/28/2015 06/09/2015  WBC 3.4 - 10.8 x10E3/uL 7.0 8.2 9.8  Hemoglobin 11.1 - 15.9 g/dL 13.2 12.2 13.3  Hematocrit 34.0 - 46.6 % 41.3 36.7 39.4  Platelets 150 - 450 x10E3/uL 380 417(H) 292   Lab Results  Component Value Date   MCV 93 03/05/2018   MCV 92 09/28/2015   MCV 96.3 06/09/2015   Lab Results  Component Value Date   TSH 4.020 03/05/2018   Lab Results  Component Value Date   HGBA1C 4.8 07/07/2014   Lipid Panel     Component Value Date/Time   CHOL 142 03/05/2018 1541   TRIG 118 03/05/2018 1541   HDL 65 03/05/2018 1541   CHOLHDL 2.2 03/05/2018 1541   CHOLHDL 3.0 05/12/2015 1459   VLDL 30 05/12/2015 1459   LDLCALC 53 03/05/2018 1541    RADIOLOGY: No results found.  IMPRESSION: 1. Essential hypertension   2. Mixed hyperlipidemia   3. Chronic kidney disease, stage 3 (moderate) (HCC)   4. Gastroesophageal reflux disease without esophagitis   5. Anxiety and depression   6. Chronic back pain, unspecified back location, unspecified back pain laterality   7. Obesity (BMI 30-39.9)   8. Hypothyroidism, unspecified type     ASSESSMENT AND  PLAN: Ms. Nakkia Mackiewicz is a 67 year old female who has a history of depression and continues to take Cymbalta 60 mg daily.  In the past she had frequent crying spells but these have improved.  Blood pressure today is controlled on long-acting Cardizem 240 mg daily, Toprol-XL 100 mg in the morning and 50 mg at night in addition to furosemide which she is taking 20 mg every 3 days.  She has stage III chronic kidney disease and is followed by Dr. Lowanda Foster.  She continues to be on atorvastatin 40 mg daily in addition to fenofibrate and Lovaza 2 capsules twice a day. Recent LDL cholesterol was excellent at 53 and triglycerides were 118.  Total cholesterol was 142 with HDL 65.  She continues to be on levothyroxine and is  tolerating her dose well.  Most recent TSH was 4.02 on March 05, 2018.  GERD is controlled with pantoprazole.  She has degenerative disc disease with significant back discomfort limiting her mobility.  She is moderately obese with a BMI of 36.64.  Weight loss was recommended.  She will be following up with Dr. Lucillie Garfinkel for her renal insufficiency.  She sees Dr. Tula Nakayama for primary care.  I will see her in 1 year for follow-up Cardiologic evaluation.  Time spent: 25 minutes  Troy Sine, MD, [facility]  07/30/2018 7:09 PM

## 2018-07-28 NOTE — Patient Instructions (Signed)
Medication Instructions:  Your physician recommends that you continue on your current medications as directed. Please refer to the Current Medication list given to you today.  If you need a refill on your cardiac medications before your next appointment, please call your pharmacy.   Follow-Up: At [facility], you and your health needs are our priority.  As part of our continuing mission to provide you with exceptional heart care, we have created designated Provider Care Teams.  These Care Teams include your primary Cardiologist (physician) and Advanced Practice Providers (APPs -  Physician Assistants and Nurse Practitioners) who all work together to provide you with the care you need, when you need it. You will need a follow up appointment in 12 months.  Please call our office 2 months in advance to schedule this appointment.  You may see Dr. Kelly or one of the following Advanced Practice Providers on your designated Care Team: Hao Meng, PA-C . Angela Duke, PA-C  

## 2018-07-30 ENCOUNTER — Encounter: Payer: Self-pay | Admitting: Cardiovascular Disease

## 2018-08-31 ENCOUNTER — Other Ambulatory Visit: Payer: Self-pay | Admitting: Family Medicine

## 2018-08-31 NOTE — Telephone Encounter (Signed)
Last seen 11/18/17

## 2018-09-01 ENCOUNTER — Ambulatory Visit: Payer: Medicare Other

## 2018-09-04 ENCOUNTER — Telehealth: Payer: Self-pay | Admitting: Family Medicine

## 2018-09-04 MED ORDER — DULOXETINE HCL 60 MG PO CPEP: 60.0000 mg | ORAL_CAPSULE | Freq: Two times a day (BID) | ORAL | 0 refills | Status: DC

## 2018-09-04 NOTE — Telephone Encounter (Signed)
Appears to be generic - will re-send

## 2018-09-04 NOTE — Telephone Encounter (Signed)
I was under the impression she was getting generic Cymbalta.  If not, ok to change to generic IF ok with patient.

## 2018-09-14 ENCOUNTER — Other Ambulatory Visit: Payer: Self-pay | Admitting: Cardiovascular Disease

## 2018-09-14 ENCOUNTER — Other Ambulatory Visit: Payer: Self-pay | Admitting: Family Medicine

## 2018-09-15 NOTE — Telephone Encounter (Signed)
Last seen 11/18/17

## 2018-09-16 DIAGNOSIS — G4701 Insomnia due to medical condition: Secondary | ICD-10-CM | POA: Diagnosis not present

## 2018-09-16 DIAGNOSIS — M5415 Radiculopathy, thoracolumbar region: Secondary | ICD-10-CM | POA: Diagnosis not present

## 2018-09-16 DIAGNOSIS — R252 Cramp and spasm: Secondary | ICD-10-CM | POA: Diagnosis not present

## 2018-09-16 DIAGNOSIS — Z79891 Long term (current) use of opiate analgesic: Secondary | ICD-10-CM | POA: Diagnosis not present

## 2018-09-16 DIAGNOSIS — M961 Postlaminectomy syndrome, not elsewhere classified: Secondary | ICD-10-CM | POA: Diagnosis not present

## 2018-09-17 ENCOUNTER — Encounter: Payer: Self-pay | Admitting: Endocrinology

## 2018-09-17 ENCOUNTER — Ambulatory Visit (INDEPENDENT_AMBULATORY_CARE_PROVIDER_SITE_OTHER): Payer: Medicare Other | Admitting: Endocrinology

## 2018-09-17 VITALS — BP 126/70 | HR 75 | Ht 65.0 in | Wt 223.6 lb

## 2018-09-17 DIAGNOSIS — E039 Hypothyroidism, unspecified: Secondary | ICD-10-CM

## 2018-09-17 LAB — T4, FREE: Free T4: 1.13 ng/dL (ref 0.60–1.60)

## 2018-09-17 LAB — TSH: TSH: 1.88 u[IU]/mL (ref 0.35–4.50)

## 2018-09-17 NOTE — Progress Notes (Signed)
Subjective:    Patient ID: Regina Horton, female    DOB: March 27, 1951, 68 y.o.   MRN: [id number]  HPI Pt returns for f/u of a small multinodular goiter and hypothyroidism (both dx'ed 2011; f/u US in 2019 showed nodules were again too small to need bx; she has been on synthroid since 2011).  She does not notice any swelling at the neck.  pt states she feels well in general, except for weight gain.   Past Medical History:  Diagnosis Date  . Allergic rhinitis   . [facility] at [facility])   . Anxiety   . Aortic sclerosis   . Cataract 2016  . Chronic back pain   . Chronic constipation   . CKD (chronic kidney disease)   . Depression   . Fibromyalgia   . GERD (gastroesophageal reflux disease)   . Hyperlipidemia   . Hypertension   . Hypertension   . Mitral regurgitation   . Osteoporosis   . Palpitations   . Thyroid disease   . Tricuspid regurgitation     Past Surgical History:  Procedure Laterality Date  . BACK SURGERY  2005  . BREAST LUMPECTOMY  1970   right   . CATARACT EXTRACTION W/PHACO Right 06/15/2015   Procedure: CATARACT EXTRACTION PHACO AND INTRAOCULAR LENS PLACEMENT (IOC);  Surgeon: Tonny Branch, MD;  Location: [facility];  Service: Ophthalmology;  Laterality: Right;  CDE: 10.33  . CATARACT EXTRACTION W/PHACO Left 07/17/2015   Procedure: CATARACT EXTRACTION PHACO AND INTRAOCULAR LENS PLACEMENT (IOC);  Surgeon: Tonny Branch, MD;  Location: [facility];  Service: Ophthalmology;  Laterality: Left;  CDE:7.60  . NM MYOCAR PERF WALL MOTION  02/16/2009   normal  . ORIF ANKLE FRACTURE Left 10/26/2014   Procedure: OPEN REDUCTION INTERNAL FIXATION (ORIF) ANKLE FRACTURE;  Surgeon: Sanjuana Kava, MD;  Location: [facility];  Service: Orthopedics;  Laterality: Left;  . TONSILLECTOMY  childhood   . TUBAL LIGATION  1973  . US ECHOCARDIOGRAPHY  12/19/2008   mild mitral annular ca+,AOV mildly sclerotic,mild MR & TR    Social History   Socioeconomic History  . Marital status: Married    Spouse name: Not on  file  . Number of children: 2  . Years of education: Not on file  . Highest education level: Not on file  Occupational History  . Occupation: disabled   Social Needs  . Financial resource strain: Not on file  . Food insecurity:    Worry: Not on file    Inability: Not on file  . Transportation needs:    Medical: Not on file    Non-medical: Not on file  Tobacco Use  . Smoking status: Former Smoker    Packs/day: 1.00    Years: 20.00    Pack years: 20.00    Types: Cigarettes    Last attempt to quit: 12/24/2003    Years since quitting: 14.7  . Smokeless tobacco: Never Used  Substance and Sexual Activity  . Alcohol use: No  . Drug use: Yes    Types: Marijuana    Comment: boils marijuana in water and makes a tea about 3 times a week  . Sexual activity: Not Currently    Birth control/protection: Surgical  Lifestyle  . Physical activity:    Days per week: Not on file    Minutes per session: Not on file  . Stress: Not on file  Relationships  . Social connections:    Talks on phone: Not on file    Gets  together: Not on file    Attends religious service: Not on file    Active member of club or organization: Not on file    Attends meetings of clubs or organizations: Not on file    Relationship status: Not on file  . Intimate partner violence:    Fear of current or ex partner: Not on file    Emotionally abused: Not on file    Physically abused: Not on file    Forced sexual activity: Not on file  Other Topics Concern  . Not on file  Social History Narrative  . Not on file    Current Outpatient Medications on File Prior to Visit  Medication Sig Dispense Refill  . atorvastatin (LIPITOR) 40 MG tablet Take 1 tablet (40 mg total) by mouth daily. 90 tablet 3  . b complex vitamins tablet Take 1 tablet by mouth daily. 1000 units    . Carboxymethylcellul-Glycerin (REFRESH RELIEVA OP) Apply 1 drop to eye. 6 times per day    . carboxymethylcellulose (REFRESH PLUS) 0.5 % SOLN 1 drop 3  (three) times daily as needed.    Marland Kitchen CARTIA XT 240 MG 24 hr capsule TAKE 1 CAPSULE BY MOUTH ONCE DAILY 90 capsule 2  . Cholecalciferol (VITAMIN D) 1000 UNITS capsule Take 2,000 Units by mouth 2 (two) times daily.     Marland Kitchen denosumab (PROLIA) 60 MG/ML SOSY injection Inject 60 mg into the skin every 6 (six) months.    . DULoxetine (CYMBALTA) 60 MG capsule Take 1 capsule (60 mg total) by mouth 2 (two) times daily. 180 capsule 0  . fenofibrate 160 MG tablet TAKE 1 TABLET BY MOUTH ONCE DAILY 90 tablet 2  . fluticasone (FLONASE) 50 MCG/ACT nasal spray Place 2 sprays into both nostrils daily. 16 g 6  . furosemide (LASIX) 20 MG tablet Take 20 mg by mouth daily as needed. Pt taking every 3 days.  3  . gabapentin (NEURONTIN) 800 MG tablet TAKE 1 TABLET BY MOUTH TWICE DAILY 180 tablet 0  . hydrOXYzine (VISTARIL) 25 MG capsule Take 25 mg by mouth 2 (two) times daily at 8 am and 10 pm.    . levothyroxine (SYNTHROID, LEVOTHROID) 50 MCG tablet TAKE ONE TABLET BY MOUTH ONCE DAILY 90 tablet 3  . meloxicam (MOBIC) 15 MG tablet Take 15 mg by mouth daily.    . metoprolol succinate (TOPROL-XL) 100 MG 24 hr tablet TAKE 1 TABLET BY MOUTH IN THE MORNING AND 1/2 (ONE-HALF) TABLET IN THE EVENING 135 tablet 1  . omega-3 acid ethyl esters (LOVAZA) 1 g capsule Take 2 capsules (2 g total) by mouth 2 (two) times daily. 180 capsule 3  . oxyCODONE-acetaminophen (PERCOCET) 7.5-325 MG tablet Take 1 tablet by mouth every 8 (eight) hours as needed.    . pantoprazole (PROTONIX) 40 MG tablet TAKE 1 TABLET BY MOUTH ONCE DAILY 90 tablet 0   No current facility-administered medications on file prior to visit.     Allergies  Allergen Reactions  . Xyzal [Levocetirizine Dihydrochloride] Hives and Itching  . Codeine     Pt states she does not have any problems with codeine.  . Nickel Rash    Family History  Problem Relation Age of Onset  . Rheum arthritis Mother   . Osteoporosis Mother   . Heart disease Father   . Diabetes Father     . Hyperlipidemia Father   . Bladder Cancer Father   . Heart disease Brother   . Arthritis Brother  BP 126/70 (BP Location: Left Arm, Patient Position: Sitting, Cuff Size: Large)   Pulse 75   Ht 5\' 5"  (1.651 m)   Wt 223 lb 9.6 oz (101.4 kg)   SpO2 93%   BMI 37.21 kg/m   Review of Systems Denies depression.      Objective:   Physical Exam VITAL SIGNS:  See vs page GENERAL: no distress NECK: There is no palpable thyroid enlargement.  No thyroid nodule is palpable.  No palpable lymphadenopathy at the anterior neck.   Lab Results  Component Value Date   TSH 1.88 09/17/2018       Assessment & Plan:  Hypothyroidism: well-replaced Small multinodular goiter: non-palpable.   Patient Instructions  blood tests are requested for you today.  We'll let you know about the results.  Please return in 1 year.

## 2018-09-17 NOTE — Patient Instructions (Signed)
blood tests are requested for you today.  We'll let you know about the results.   Please return in 1 year.  

## 2018-09-21 DIAGNOSIS — Z1159 Encounter for screening for other viral diseases: Secondary | ICD-10-CM | POA: Diagnosis not present

## 2018-09-21 DIAGNOSIS — H40013 Open angle with borderline findings, low risk, bilateral: Secondary | ICD-10-CM | POA: Diagnosis not present

## 2018-09-21 DIAGNOSIS — H26493 Other secondary cataract, bilateral: Secondary | ICD-10-CM | POA: Diagnosis not present

## 2018-09-21 DIAGNOSIS — I10 Essential (primary) hypertension: Secondary | ICD-10-CM | POA: Diagnosis not present

## 2018-09-21 DIAGNOSIS — E559 Vitamin D deficiency, unspecified: Secondary | ICD-10-CM | POA: Diagnosis not present

## 2018-09-21 DIAGNOSIS — R809 Proteinuria, unspecified: Secondary | ICD-10-CM | POA: Diagnosis not present

## 2018-09-21 DIAGNOSIS — N183 Chronic kidney disease, stage 3 (moderate): Secondary | ICD-10-CM | POA: Diagnosis not present

## 2018-09-21 DIAGNOSIS — Z79899 Other long term (current) drug therapy: Secondary | ICD-10-CM | POA: Diagnosis not present

## 2018-09-21 DIAGNOSIS — D509 Iron deficiency anemia, unspecified: Secondary | ICD-10-CM | POA: Diagnosis not present

## 2018-09-22 ENCOUNTER — Ambulatory Visit (INDEPENDENT_AMBULATORY_CARE_PROVIDER_SITE_OTHER): Payer: Medicare Other | Admitting: *Deleted

## 2018-09-22 DIAGNOSIS — M810 Age-related osteoporosis without current pathological fracture: Secondary | ICD-10-CM

## 2018-09-22 MED ORDER — DENOSUMAB 60 MG/ML ~~LOC~~ SOSY
60.0000 mg | PREFILLED_SYRINGE | Freq: Once | SUBCUTANEOUS | Status: AC
  Administered 2018-09-22: 60 mg via SUBCUTANEOUS

## 2018-09-22 NOTE — Progress Notes (Signed)
Pt given Prolia inj Tolerated well Buy and bill Last Dexa 11/2017 Next inj due 03/24/2019

## 2018-10-02 DIAGNOSIS — M5415 Radiculopathy, thoracolumbar region: Secondary | ICD-10-CM | POA: Diagnosis not present

## 2018-10-02 DIAGNOSIS — M961 Postlaminectomy syndrome, not elsewhere classified: Secondary | ICD-10-CM | POA: Diagnosis not present

## 2018-10-02 DIAGNOSIS — M545 Low back pain: Secondary | ICD-10-CM | POA: Diagnosis not present

## 2018-10-29 ENCOUNTER — Other Ambulatory Visit: Payer: Self-pay | Admitting: Cardiovascular Disease

## 2018-10-29 ENCOUNTER — Other Ambulatory Visit: Payer: Self-pay | Admitting: Family Medicine

## 2018-10-30 NOTE — Telephone Encounter (Signed)
Rx(s) sent to pharmacy electronically.  

## 2018-11-05 ENCOUNTER — Encounter: Payer: Self-pay | Admitting: Family Medicine

## 2018-11-05 ENCOUNTER — Other Ambulatory Visit: Payer: Self-pay | Admitting: Endocrinology

## 2018-11-06 ENCOUNTER — Other Ambulatory Visit: Payer: Self-pay | Admitting: *Deleted

## 2018-11-06 MED ORDER — FLUTICASONE PROPIONATE 50 MCG/ACT NA SUSP: 2.0000 | Freq: Every day | NASAL | 3 refills | Status: DC

## 2018-12-01 ENCOUNTER — Other Ambulatory Visit: Payer: Self-pay | Admitting: Family Medicine

## 2018-12-09 DIAGNOSIS — M130 Polyarthritis, unspecified: Secondary | ICD-10-CM | POA: Diagnosis not present

## 2018-12-09 DIAGNOSIS — G4701 Insomnia due to medical condition: Secondary | ICD-10-CM | POA: Diagnosis not present

## 2018-12-09 DIAGNOSIS — Z79891 Long term (current) use of opiate analgesic: Secondary | ICD-10-CM | POA: Diagnosis not present

## 2018-12-09 DIAGNOSIS — R252 Cramp and spasm: Secondary | ICD-10-CM | POA: Diagnosis not present

## 2018-12-25 ENCOUNTER — Telehealth: Payer: Self-pay | Admitting: *Deleted

## 2018-12-25 NOTE — Telephone Encounter (Signed)
Submitted PA request via cover my meds for patient's Omega-3 acid Ethyl Esters 1 GM capsules. PA was approved.

## 2018-12-28 ENCOUNTER — Other Ambulatory Visit: Payer: Self-pay

## 2018-12-28 MED ORDER — OMEGA-3-ACID ETHYL ESTERS 1 G PO CAPS: 2.0000 | ORAL_CAPSULE | Freq: Two times a day (BID) | ORAL | 3 refills | Status: DC

## 2018-12-28 NOTE — Telephone Encounter (Signed)
Noted thanks. Resubmitted back into pharmacy advising that PA was approved.

## 2019-01-03 ENCOUNTER — Other Ambulatory Visit: Payer: Self-pay | Admitting: Family Medicine

## 2019-01-04 NOTE — Telephone Encounter (Signed)
[name]. NTBS 30 days given 12/02/18

## 2019-01-19 ENCOUNTER — Other Ambulatory Visit: Payer: Self-pay | Admitting: *Deleted

## 2019-01-19 ENCOUNTER — Other Ambulatory Visit: Payer: Self-pay

## 2019-01-19 MED ORDER — METOPROLOL SUCCINATE ER 100 MG PO TB24: ORAL_TABLET | ORAL | 2 refills | Status: DC

## 2019-01-19 MED ORDER — DILTIAZEM HCL ER COATED BEADS 240 MG PO CP24: 240.0000 mg | ORAL_CAPSULE | Freq: Every day | ORAL | 2 refills | Status: DC

## 2019-01-19 MED ORDER — LEVOTHYROXINE SODIUM 50 MCG PO TABS: 50.0000 ug | ORAL_TABLET | Freq: Every day | ORAL | 2 refills | Status: DC

## 2019-01-19 MED ORDER — FENOFIBRATE 160 MG PO TABS: 160.0000 mg | ORAL_TABLET | Freq: Every day | ORAL | 2 refills | Status: DC

## 2019-01-19 MED ORDER — ATORVASTATIN CALCIUM 40 MG PO TABS: 40.0000 mg | ORAL_TABLET | Freq: Every day | ORAL | 2 refills | Status: DC

## 2019-01-19 MED ORDER — OMEGA-3-ACID ETHYL ESTERS 1 G PO CAPS: 2.0000 | ORAL_CAPSULE | Freq: Two times a day (BID) | ORAL | 2 refills | Status: DC

## 2019-01-20 ENCOUNTER — Telehealth (INDEPENDENT_AMBULATORY_CARE_PROVIDER_SITE_OTHER): Payer: Medicare Other | Admitting: Family Medicine

## 2019-01-20 ENCOUNTER — Other Ambulatory Visit: Payer: Self-pay

## 2019-01-20 ENCOUNTER — Encounter: Payer: Self-pay | Admitting: Family Medicine

## 2019-01-20 DIAGNOSIS — G8929 Other chronic pain: Secondary | ICD-10-CM | POA: Diagnosis not present

## 2019-01-20 DIAGNOSIS — K219 Gastro-esophageal reflux disease without esophagitis: Secondary | ICD-10-CM | POA: Diagnosis not present

## 2019-01-20 DIAGNOSIS — N183 Chronic kidney disease, stage 3 unspecified: Secondary | ICD-10-CM

## 2019-01-20 DIAGNOSIS — M5440 Lumbago with sciatica, unspecified side: Secondary | ICD-10-CM | POA: Diagnosis not present

## 2019-01-20 MED ORDER — PANTOPRAZOLE SODIUM 40 MG PO TBEC: 40.0000 mg | DELAYED_RELEASE_TABLET | Freq: Every day | ORAL | 2 refills | Status: DC

## 2019-01-20 MED ORDER — GABAPENTIN 600 MG PO TABS: ORAL_TABLET | ORAL | 1 refills | Status: DC

## 2019-01-20 MED ORDER — DULOXETINE HCL 60 MG PO CPEP: 60.0000 mg | ORAL_CAPSULE | Freq: Two times a day (BID) | ORAL | 3 refills | Status: DC

## 2019-01-20 NOTE — Progress Notes (Signed)
MyChart Virtual visit  Subjective: CC: f/u chronic low back pain PCP: [name], DO LDJ:[id number] Regina Horton is a 68 y.o. female calls for Mychart Virtual consult today. Patient provides verbal consent for consult held via phone.  Location of patient: home Location of provider: Working remotely from home Others present for call: none  1. Chronic low back pain Patient with longstanding low back pain.  She is managed by pain management with opioids but also takes gabapentin 800 mg p.o. twice daily and Cymbalta 60 mg p.o. twice daily.  She notes that she was recently placed on meloxicam by her pain specialist which she takes perhaps 1 time per month.  She does have medical history significant for stage IIIb renal disease and is followed by Dr. Lowanda Foster.  She had renal function testing recently from him which was obtained at [facility].  She notes that he has not made any comments that any of her medications need to be changed.  Overall, she reports that she feels pretty good and tries to get some activity as tolerated.  She does have issues with balance and therefore tries to be careful with walking.   ROS: Per HPI  Allergies  Allergen Reactions  . Xyzal [Levocetirizine Dihydrochloride] Hives and Itching  . Codeine     Pt states she does not have any problems with codeine.  . Nickel Rash   Past Medical History:  Diagnosis Date  . Allergic rhinitis   . Angina at rest [facility])   . Anxiety   . Aortic sclerosis   . Cataract 2016  . Chronic back pain   . Chronic constipation   . CKD (chronic kidney disease)   . Depression   . Fibromyalgia   . GERD (gastroesophageal reflux disease)   . Hyperlipidemia   . Hypertension   . Hypertension   . Mitral regurgitation   . Osteoporosis   . Palpitations   . Thyroid disease   . Tricuspid regurgitation     Current Outpatient Medications:  .  atorvastatin (LIPITOR) 40 MG tablet, Take 1 tablet (40 mg total) by mouth daily., Disp: 90 tablet,  Rfl: 2 .  b complex vitamins tablet, Take 1 tablet by mouth daily. 1000 units, Disp: , Rfl:  .  Carboxymethylcellul-Glycerin (REFRESH RELIEVA OP), Apply 1 drop to eye. 6 times per day, Disp: , Rfl:  .  carboxymethylcellulose (REFRESH PLUS) 0.5 % SOLN, 1 drop 3 (three) times daily as needed., Disp: , Rfl:  .  Cholecalciferol (VITAMIN D) 1000 UNITS capsule, Take 2,000 Units by mouth 2 (two) times daily. , Disp: , Rfl:  .  diltiazem (CARTIA XT) 240 MG 24 hr capsule, Take 1 capsule (240 mg total) by mouth daily., Disp: 90 capsule, Rfl: 2 .  DULoxetine (CYMBALTA) 60 MG capsule, Take 1 capsule (60 mg total) by mouth 2 (two) times daily., Disp: 180 capsule, Rfl: 0 .  fenofibrate 160 MG tablet, Take 1 tablet (160 mg total) by mouth daily., Disp: 90 tablet, Rfl: 2 .  fluticasone (FLONASE) 50 MCG/ACT nasal spray, Place 2 sprays into both nostrils daily., Disp: 48 g, Rfl: 3 .  furosemide (LASIX) 20 MG tablet, Take 20 mg by mouth daily as needed. Pt taking every 3 days., Disp: , Rfl: 3 .  gabapentin (NEURONTIN) 800 MG tablet, TAKE 1 TABLET BY MOUTH TWICE DAILY, Disp: 180 tablet, Rfl: 0 .  hydrOXYzine (VISTARIL) 25 MG capsule, Take 25 mg by mouth 2 (two) times daily at 8 am and 10 pm., Disp: ,  Rfl:  .  levothyroxine (SYNTHROID) 50 MCG tablet, Take 1 tablet (50 mcg total) by mouth daily., Disp: 90 tablet, Rfl: 2 .  meloxicam (MOBIC) 15 MG tablet, Take 15 mg by mouth daily., Disp: , Rfl:  .  metoprolol succinate (TOPROL-XL) 100 MG 24 hr tablet, Take 1 tablet (100 mg total) by mouth every morning and take 0.5 tablet (50 mg total) by mouth every evening., Disp: 135 tablet, Rfl: 2 .  omega-3 acid ethyl esters (LOVAZA) 1 g capsule, Take 2 capsules (2 g total) by mouth 2 (two) times daily., Disp: 180 capsule, Rfl: 2 .  oxyCODONE-acetaminophen (PERCOCET) 7.5-325 MG tablet, Take 1 tablet by mouth every 8 (eight) hours as needed., Disp: , Rfl:  .  pantoprazole (PROTONIX) 40 MG tablet, Take 1 tablet by mouth once daily,  Disp: 90 tablet, Rfl: 0  Assessment/ Plan: 68 y.o. female   1. Chronic bilateral low back pain with sciatica, sciatica laterality unspecified I renewed Cymbalta and gabapentin.  I quit ahead and decrease her gabapentin to max dose of 900 mg daily.  We discussed reduction.  We will also need to consider reduction in the Cymbalta pending her recent lab results.  I will attempt to obtain her recent renal function panel from [facility] and give her a call if we need to proceed with reduction of the duloxetine. - DULoxetine (CYMBALTA) 60 MG capsule; Take 1 capsule (60 mg total) by mouth 2 (two) times daily.  Dispense: 180 capsule; Refill: 3 - gabapentin (NEURONTIN) 600 MG tablet; Take 1/2 tablet every morning and take 1 tablet every evening.  Dispense: 135 tablet; Refill: 1  2. Kidney disease, chronic, stage III (moderate, EGFR 30-59 ml/min) (HCC) Followed by Dr. Lowanda Foster  3. Gastroesophageal reflux disease without esophagitis Controlled.  Refill of Protonix sent - pantoprazole (PROTONIX) 40 MG tablet; Take 1 tablet (40 mg total) by mouth daily.  Dispense: 90 tablet; Refill: 2   Start time: 2:00pm End time: 2:14pm  Total time spent on patient care (including telephone call/ virtual visit): 18 minutes  [place], [place] [phone]

## 2019-01-25 ENCOUNTER — Other Ambulatory Visit: Payer: Self-pay

## 2019-01-25 MED ORDER — DILTIAZEM HCL ER COATED BEADS 240 MG PO CP24: 240.0000 mg | ORAL_CAPSULE | Freq: Every day | ORAL | 2 refills | Status: DC

## 2019-01-25 MED ORDER — ATORVASTATIN CALCIUM 40 MG PO TABS: 40.0000 mg | ORAL_TABLET | Freq: Every day | ORAL | 2 refills | Status: DC

## 2019-01-25 MED ORDER — OMEGA-3-ACID ETHYL ESTERS 1 G PO CAPS: 2.0000 | ORAL_CAPSULE | Freq: Two times a day (BID) | ORAL | 2 refills | Status: DC

## 2019-01-25 MED ORDER — METOPROLOL SUCCINATE ER 100 MG PO TB24: ORAL_TABLET | ORAL | 2 refills | Status: DC

## 2019-01-25 MED ORDER — FENOFIBRATE 160 MG PO TABS: 160.0000 mg | ORAL_TABLET | Freq: Every day | ORAL | 2 refills | Status: DC

## 2019-01-29 ENCOUNTER — Other Ambulatory Visit: Payer: Self-pay | Admitting: *Deleted

## 2019-01-29 DIAGNOSIS — M5440 Lumbago with sciatica, unspecified side: Secondary | ICD-10-CM

## 2019-01-29 DIAGNOSIS — G8929 Other chronic pain: Secondary | ICD-10-CM

## 2019-01-29 MED ORDER — FLUTICASONE PROPIONATE 50 MCG/ACT NA SUSP: 2.0000 | Freq: Every day | NASAL | 1 refills | Status: DC

## 2019-02-09 ENCOUNTER — Encounter: Payer: Self-pay | Admitting: Family Medicine

## 2019-03-08 DIAGNOSIS — G4701 Insomnia due to medical condition: Secondary | ICD-10-CM | POA: Diagnosis not present

## 2019-03-08 DIAGNOSIS — M130 Polyarthritis, unspecified: Secondary | ICD-10-CM | POA: Diagnosis not present

## 2019-03-08 DIAGNOSIS — R252 Cramp and spasm: Secondary | ICD-10-CM | POA: Diagnosis not present

## 2019-03-08 DIAGNOSIS — Z79891 Long term (current) use of opiate analgesic: Secondary | ICD-10-CM | POA: Diagnosis not present

## 2019-04-16 DIAGNOSIS — M961 Postlaminectomy syndrome, not elsewhere classified: Secondary | ICD-10-CM | POA: Diagnosis not present

## 2019-04-16 DIAGNOSIS — M545 Low back pain: Secondary | ICD-10-CM | POA: Diagnosis not present

## 2019-04-16 DIAGNOSIS — M5415 Radiculopathy, thoracolumbar region: Secondary | ICD-10-CM | POA: Diagnosis not present

## 2019-05-12 ENCOUNTER — Encounter: Payer: Self-pay | Admitting: Endocrinology

## 2019-05-12 ENCOUNTER — Encounter: Payer: Self-pay | Admitting: Family Medicine

## 2019-05-13 MED ORDER — OMEGA-3-ACID ETHYL ESTERS 1 G PO CAPS: 2.0000 | ORAL_CAPSULE | Freq: Two times a day (BID) | ORAL | 0 refills | Status: DC

## 2019-05-18 DIAGNOSIS — Z79891 Long term (current) use of opiate analgesic: Secondary | ICD-10-CM | POA: Diagnosis not present

## 2019-05-18 DIAGNOSIS — R252 Cramp and spasm: Secondary | ICD-10-CM | POA: Diagnosis not present

## 2019-05-18 DIAGNOSIS — M130 Polyarthritis, unspecified: Secondary | ICD-10-CM | POA: Diagnosis not present

## 2019-05-18 DIAGNOSIS — G4701 Insomnia due to medical condition: Secondary | ICD-10-CM | POA: Diagnosis not present

## 2019-06-16 ENCOUNTER — Other Ambulatory Visit: Payer: Self-pay | Admitting: Cardiovascular Disease

## 2019-06-29 ENCOUNTER — Other Ambulatory Visit: Payer: Self-pay | Admitting: Family Medicine

## 2019-06-29 DIAGNOSIS — G8929 Other chronic pain: Secondary | ICD-10-CM

## 2019-06-29 DIAGNOSIS — M5440 Lumbago with sciatica, unspecified side: Secondary | ICD-10-CM

## 2019-06-30 ENCOUNTER — Telehealth (INDEPENDENT_AMBULATORY_CARE_PROVIDER_SITE_OTHER): Payer: Medicare Other | Admitting: Cardiovascular Disease

## 2019-06-30 ENCOUNTER — Encounter: Payer: Self-pay | Admitting: Cardiovascular Disease

## 2019-06-30 VITALS — BP 117/65 | HR 65 | Ht 65.0 in | Wt 220.0 lb

## 2019-06-30 DIAGNOSIS — E039 Hypothyroidism, unspecified: Secondary | ICD-10-CM

## 2019-06-30 DIAGNOSIS — G8929 Other chronic pain: Secondary | ICD-10-CM | POA: Diagnosis not present

## 2019-06-30 DIAGNOSIS — M25473 Effusion, unspecified ankle: Secondary | ICD-10-CM

## 2019-06-30 DIAGNOSIS — E782 Mixed hyperlipidemia: Secondary | ICD-10-CM | POA: Diagnosis not present

## 2019-06-30 DIAGNOSIS — I10 Essential (primary) hypertension: Secondary | ICD-10-CM

## 2019-06-30 DIAGNOSIS — F329 Major depressive disorder, single episode, unspecified: Secondary | ICD-10-CM

## 2019-06-30 DIAGNOSIS — M549 Dorsalgia, unspecified: Secondary | ICD-10-CM | POA: Diagnosis not present

## 2019-06-30 DIAGNOSIS — F419 Anxiety disorder, unspecified: Secondary | ICD-10-CM

## 2019-06-30 DIAGNOSIS — F32A Depression, unspecified: Secondary | ICD-10-CM

## 2019-06-30 DIAGNOSIS — E669 Obesity, unspecified: Secondary | ICD-10-CM | POA: Diagnosis not present

## 2019-06-30 NOTE — Patient Instructions (Addendum)
Medication Instructions:  The current medical regimen is effective;  continue present plan and medications.  *If you need a refill on your cardiac medications before your next appointment, please call your pharmacy*  Lab Work: Fasting lab work (CBC, CMET, TSH, LIPID, A1C) Attached are the lab orders that are needed before your upcoming appointment, please come in anytime to have your labs drawn.   They are fasting labs, so nothing to eat or drink after midnight.  Lab hours: 8:00-4:00 lunch hours 12:45-1:45  If you have labs (blood work) drawn today and your tests are completely normal, you will receive your results only by: Marland Kitchen MyChart Message (if you have MyChart) OR . A paper copy in the mail If you have any lab test that is abnormal or we need to change your treatment, we will call you to review the results.  Follow-Up: At [facility], you and your health needs are our priority.  As part of our continuing mission to provide you with exceptional heart care, we have created designated Provider Care Teams.  These Care Teams include your primary Cardiologist (physician) and Advanced Practice Providers (APPs -  Physician Assistants and Nurse Practitioners) who all work together to provide you with the care you need, when you need it.  Your next appointment:   6 months  The format for your next appointment:   In Person  Provider:   You may see Dr.Kelly or one of the following Advanced Practice Providers on your designated Care Team:    Almyra Deforest, PA-C  Fabian Sharp, PA-C or   Roby Lofts, [place]

## 2019-06-30 NOTE — Progress Notes (Signed)
Virtual Visit via Telephone Note   This visit type was conducted due to national recommendations for restrictions regarding the COVID-19 Pandemic (e.g. social distancing) in an effort to limit this patient's exposure and mitigate transmission in our community.  Due to her co-morbid illnesses, this patient is at least at moderate risk for complications without adequate follow up.  This format is felt to be most appropriate for this patient at this time.  The patient did not have access to video technology/had technical difficulties with video requiring transitioning to audio format only (telephone).  All issues noted in this document were discussed and addressed.  No physical exam could be performed with this format.  Please refer to the patient's chart for her  consent to telehealth for [facility]   Date:  06/30/2019   ID:  SOMA GENTLE, DOB 1951-02-11, MRN [id number]  Patient Location: Home Provider Location: Home  PCP:  [name], DO  Cardiologist:  Shelva Majestic, MD Electrophysiologist:  None   Evaluation Performed:  Follow-Up Visit  Chief Complaint:  11 month f/u  History of Present Illness:    Regina Horton is a 68 y.o. female who has a history of palpitations which have been controlled with beta blocker therapy, hypertension, mixed hyperlipidemia, obesity, mild renal insufficiency, and chronic low back pain.  An echo Doppler study in 2010  showed mild aortic sclerosis without stenosis, mild mitral and calcification with mild MR, and mild TR, and normal systolic function.  A nuclear perfusion study showed normal perfusion.  She has a significant history of depression and takes Cymbalta, both for depression, as well as for chronic low back pain.  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  She walks with a cane.  Her low back pain has been debilitating.  She is  follow-up Dr. Birdie Sons for renal insufficiency. She has had issues with chronic pain in particular back pain.  She has seen Dr. Jeanell Sparrow most for pain management.  She recently stopped taking her pain medications due to concerns at the were adversely affecting her.  She continues to have issues with depression and cries often, because her back hurts.  She also has noticed some palpitations.    When I saw her in January 2018, she  continued to have low back issues and was being evaluated by Dr. Ellene Route.  She is been evaluated by Dr. Ellene Route.She does experience frequent ankle swelling almost daily and occasionally this is more pronounced.  She denies any episodes of chest pain, palpitations, presyncope or syncope.  On 05/19/2017: Creatinine was 1.5.  Hemoglobin 13.6.  Total cholesterol was 150, HDL 57, LDL 57, and triglycerides are mildly increased at 179.  She walks with a walker primarily due to knee discomfort.    When I last saw her in June 2019 she was feeling well on low-dose hydrochlorothiazide and had improvement in her ankle edema.  Laboratory tended to show stable renal function.   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  She has experienced rare nonexertional episodes of chest pain which are atypical and not representative of angina.  Her  creatinine in July 2019 was 1.7.  Lipid studies revealed total cholesterol 142, HDL 65, LDL 53, and triglycerides 118.  She is followed by Dr. Lowanda Foster with reference to her renal disease.  Since I last saw  her in December 2019 she has been doing well.  She denies any recent chest pain or palpitations.  Her arthritic symptoms have improved and she only takes meloxicam perhaps 1 time a month.  She has not had recent laboratory over the past year and essentially has been staying home due to the COVID-19 pandemic.  Her son has been living with she and her husband and does all the shopping.  She was  changed to furosemide 20 mg every third day by Dr. Lowanda Foster and is doing well with reference to her previous leg swelling.  She tells me that Dr. Lowanda Foster has just retired and she will start seeing his new partner.  She continues to be on diltiazem long-acting 240 mg daily, Lasix 20 mg every third day and metoprolol succinate 100 mg in the morning and 50 mg in the evening.  She does not have any palpitations.  She is on atorvastatin and Lovaza for her mixed hyperlipidemia.  She continues to be on levothyroxine for hypothyroidism.  She presents for evaluation.  Her depression is treated with Cymbalta 60 mg 2 times a day and she is doing well and does not feel depressed.  The patient does not have symptoms concerning for COVID-19 infection (fever, chills, cough, or new shortness of breath).    Past Medical History:  Diagnosis Date  . Allergic rhinitis   . Angina at rest [facility])   . Anxiety   . Aortic sclerosis   . Cataract 2016  . Chronic back pain   . Chronic constipation   . CKD (chronic kidney disease)   . Depression   . Fibromyalgia   . GERD (gastroesophageal reflux disease)   . Hyperlipidemia   . Hypertension   . Hypertension   . Mitral regurgitation   . Osteoporosis   . Palpitations   . Thyroid disease   . Tricuspid regurgitation    Past Surgical History:  Procedure Laterality Date  . BACK SURGERY  2005  . BREAST LUMPECTOMY  1970   right   . CATARACT EXTRACTION W/PHACO Right 06/15/2015   Procedure: CATARACT EXTRACTION PHACO AND INTRAOCULAR LENS PLACEMENT (IOC);  Surgeon: Tonny Branch, MD;  Location: [facility];  Service: Ophthalmology;  Laterality: Right;  CDE: 10.33  . CATARACT EXTRACTION W/PHACO Left 07/17/2015   Procedure: CATARACT EXTRACTION PHACO AND INTRAOCULAR LENS PLACEMENT (IOC);  Surgeon: Tonny Branch, MD;  Location: [facility];  Service: Ophthalmology;  Laterality: Left;  CDE:7.60  . NM MYOCAR PERF WALL MOTION  02/16/2009   normal  . ORIF ANKLE FRACTURE Left 10/26/2014    Procedure: OPEN REDUCTION INTERNAL FIXATION (ORIF) ANKLE FRACTURE;  Surgeon: Sanjuana Kava, MD;  Location: [facility];  Service: Orthopedics;  Laterality: Left;  . TONSILLECTOMY  childhood   . TUBAL LIGATION  1973  . US ECHOCARDIOGRAPHY  12/19/2008   mild mitral annular ca+,AOV mildly sclerotic,mild MR & TR     Current Meds  Medication Sig  . atorvastatin (LIPITOR) 40 MG tablet Take 1 tablet (40 mg total) by mouth daily.  Marland Kitchen b complex vitamins tablet Take 1 tablet by mouth daily. 1000 units  . Carboxymethylcellul-Glycerin (REFRESH RELIEVA OP) Apply 1 drop to eye. 6 times per day  . carboxymethylcellulose (REFRESH PLUS) 0.5 % SOLN 1 drop 3 (three) times daily as needed.  . Cholecalciferol (VITAMIN D) 1000 UNITS capsule Take 2,000 Units by mouth 2 (two) times daily.   Marland Kitchen diltiazem (CARTIA XT) 240 MG 24 hr capsule Take 1 capsule (240 mg total) by mouth daily.  Marland Kitchen  DULoxetine (CYMBALTA) 60 MG capsule Take 1 capsule (60 mg total) by mouth 2 (two) times daily.  . fenofibrate 160 MG tablet Take 1 tablet (160 mg total) by mouth daily.  . fluticasone (FLONASE) 50 MCG/ACT nasal spray Place 2 sprays into both nostrils daily.  . furosemide (LASIX) 20 MG tablet Take 20 mg by mouth daily as needed. Pt taking every 3 days.  . hydrOXYzine (ATARAX/VISTARIL) 25 MG tablet Take 25-50 mg by mouth at bedtime.  Marland Kitchen levothyroxine (SYNTHROID) 50 MCG tablet Take 1 tablet (50 mcg total) by mouth daily.  . meloxicam (MOBIC) 15 MG tablet Take 15 mg by mouth daily.  . metoprolol succinate (TOPROL-XL) 100 MG 24 hr tablet Take 1 tablet (100 mg total) by mouth every morning and take 0.5 tablet (50 mg total) by mouth every evening.  Marland Kitchen omega-3 acid ethyl esters (LOVAZA) 1 g capsule TAKE 2 CAPSULES TWICE A DAY  . oxyCODONE-acetaminophen (PERCOCET) 7.5-325 MG tablet Take 1 tablet by mouth every 8 (eight) hours as needed.  . pantoprazole (PROTONIX) 40 MG tablet Take 1 tablet (40 mg total) by mouth daily.  . [DISCONTINUED] gabapentin  (NEURONTIN) 600 MG tablet Take 1/2 tablet every morning and take 1 tablet every evening.     Allergies:   Xyzal [levocetirizine dihydrochloride], Codeine, and Nickel   Social History   Tobacco Use  . Smoking status: Former Smoker    Packs/day: 1.00    Years: 20.00    Pack years: 20.00    Types: Cigarettes    Quit date: 12/24/2003    Years since quitting: 15.5  . Smokeless tobacco: Never Used  Substance Use Topics  . Alcohol use: No  . Drug use: Yes    Types: Marijuana    Comment: boils marijuana in water and makes a tea about 3 times a week     Family Hx: The patient's family history includes Arthritis in her brother; Bladder Cancer in her father; Diabetes in her father; Heart disease in her brother and father; Hyperlipidemia in her father; Osteoporosis in her mother; Rheum arthritis in her mother.  ROS:   Please see the history of present illness.    She denies any fevers chills night's No cough No change in smell or taste No wheezing No chest pain or anginal symptoms No abdominal pain Swelling has resolved Degenerative changes of her spine, controlled Sleeping well Depression controlled with Cymbalta All other systems reviewed and are negative.   Prior CV studies:   The following studies were reviewed today:  No recent studies  Labs/Other Tests and Data Reviewed:    EKG:  An ECG dated 07/28/2018 was personally reviewed today and demonstrated:  NSR at 65; IRBBB, anterior nonspecific ST-T changes  Recent Labs: 09/17/2018: TSH 1.88   Recent Lipid Panel Lab Results  Component Value Date/Time   CHOL 142 03/05/2018 03:41 PM   TRIG 118 03/05/2018 03:41 PM   HDL 65 03/05/2018 03:41 PM   CHOLHDL 2.2 03/05/2018 03:41 PM   CHOLHDL 3.0 05/12/2015 02:59 PM   LDLCALC 53 03/05/2018 03:41 PM    Wt Readings from Last 3 Encounters:  06/30/19 220 lb (99.8 kg)  09/17/18 223 lb 9.6 oz (101.4 kg)  07/28/18 220 lb 3.2 oz (99.9 kg)     Objective:    Vital Signs:  BP  117/65   Pulse 65   Ht 5\' 5"  (1.651 m)   Wt 220 lb (99.8 kg)   BMI 36.61 kg/m    Since this is a  virtual visit I could not physically examine the patient. Breathing is normal and not labored There is no audible wheezing Her pulse was regular without irregularity She denied any chest wall tenderness She denied any abdominal discomfort Her prior edema had resolved She had a normal affect and mood  ASSESSMENT & PLAN:    1. Essential hypertension: Blood pressure today is controlled on current therapy including diltiazem 240 mg daily, furosemide 20 mg every third day and Toprol-XL 100 mg in the morning and 50 mg in the evening.  Target blood pressure less than 130/80. 2. Palpitations: Controlled with current dose of Toprol and diltiazem. 3. Mixed hyperlipidemia: Currently on atorvastatin and Lovaza.  Last lipid studies July 2019 showed total cholesterol 142, HDL 65, LDL 53, triglycerides 118.  She is hesitant to go out to get blood work drawn in the COVID-19 pandemic.  I will schedule her to have follow-up laboratory when she feels comfortable to go to the lab and have blood drawn. 4. Chronic kidney disease, stage III: In February 2020 creatinine 1.76.  Followed by Dr. Lowanda Foster who is just retired.  She will be establishing care with his new partner. 5. Hypothyroidism: Currently on limbers.  Last TSH in January 2020 1.880 6. Chronic back pain: Currently stable, has a prescription for meloxicam which she rarely takes.  I discussed with her the meloxicam can adversely affect her kidney function and she states that she is only been taking perhaps 1 pill/month. 7. Obesity: BMI 36.6.  We discussed weight loss and increased walking if at all possible. 8. Anxiety/depression: Currently well controlled with Cymbalta.  COVID-19 Education: The signs and symptoms of COVID-19 were discussed with the patient and how to seek care for testing (follow up with PCP or arrange E-visit).  The importance of social  distancing was discussed today.  Time:   Today, I have spent 23 minutes with the patient with telehealth technology discussing the above problems.     Medication Adjustments/Labs and Tests Ordered: Current medicines are reviewed at length with the patient today.  Concerns regarding medicines are outlined above.   Tests Ordered: No orders of the defined types were placed in this encounter.   Medication Changes: No orders of the defined types were placed in this encounter.   Follow Up: Orders were placed for fasting laboratory including a comprehensive metabolic panel, CBC, TSH level, hemoglobin A1c, and fasting lipid studies.  She is hesitant to get her lab drawn now to the covert 19 pandemic.  She will get them drawn when she is comfortable going out.  I will plan to see her in 6 months and an office visit evaluation.  Signed, Shelva Majestic, MD  06/30/2019 9:29 AM    [facility]

## 2019-08-18 ENCOUNTER — Other Ambulatory Visit: Payer: Self-pay | Admitting: *Deleted

## 2019-08-18 DIAGNOSIS — G8929 Other chronic pain: Secondary | ICD-10-CM

## 2019-08-18 DIAGNOSIS — M5440 Lumbago with sciatica, unspecified side: Secondary | ICD-10-CM

## 2019-08-18 DIAGNOSIS — K219 Gastro-esophageal reflux disease without esophagitis: Secondary | ICD-10-CM

## 2019-08-18 MED ORDER — DULOXETINE HCL 60 MG PO CPEP: 60.0000 mg | ORAL_CAPSULE | Freq: Two times a day (BID) | ORAL | 0 refills | Status: DC

## 2019-08-18 MED ORDER — GABAPENTIN 600 MG PO TABS: ORAL_TABLET | ORAL | 0 refills | Status: DC

## 2019-08-18 MED ORDER — FLUTICASONE PROPIONATE 50 MCG/ACT NA SUSP: 2.0000 | Freq: Every day | NASAL | 0 refills | Status: DC

## 2019-08-18 MED ORDER — PANTOPRAZOLE SODIUM 40 MG PO TBEC: 40.0000 mg | DELAYED_RELEASE_TABLET | Freq: Every day | ORAL | 0 refills | Status: DC

## 2019-08-18 NOTE — Addendum Note (Signed)
Addended by: Antonietta Barcelona D on: 08/18/2019 03:43 PM   Modules accepted: Orders

## 2019-08-19 ENCOUNTER — Other Ambulatory Visit: Payer: Self-pay

## 2019-08-19 MED ORDER — DILTIAZEM HCL ER COATED BEADS 240 MG PO CP24: 240.0000 mg | ORAL_CAPSULE | Freq: Every day | ORAL | 2 refills | Status: DC

## 2019-08-19 MED ORDER — METOPROLOL SUCCINATE ER 100 MG PO TB24: ORAL_TABLET | ORAL | 2 refills | Status: DC

## 2019-08-22 ENCOUNTER — Other Ambulatory Visit: Payer: Self-pay | Admitting: Family Medicine

## 2019-08-22 DIAGNOSIS — M5440 Lumbago with sciatica, unspecified side: Secondary | ICD-10-CM

## 2019-08-22 DIAGNOSIS — K219 Gastro-esophageal reflux disease without esophagitis: Secondary | ICD-10-CM

## 2019-08-22 DIAGNOSIS — G8929 Other chronic pain: Secondary | ICD-10-CM

## 2019-08-23 MED ORDER — FLUTICASONE PROPIONATE 50 MCG/ACT NA SUSP: 2.0000 | Freq: Every day | NASAL | 0 refills | Status: DC

## 2019-08-23 MED ORDER — PANTOPRAZOLE SODIUM 40 MG PO TBEC: 40.0000 mg | DELAYED_RELEASE_TABLET | Freq: Every day | ORAL | 0 refills | Status: DC

## 2019-08-23 MED ORDER — GABAPENTIN 600 MG PO TABS: ORAL_TABLET | ORAL | 0 refills | Status: DC

## 2019-08-23 MED ORDER — DULOXETINE HCL 60 MG PO CPEP: 60.0000 mg | ORAL_CAPSULE | Freq: Two times a day (BID) | ORAL | 0 refills | Status: DC

## 2019-08-25 ENCOUNTER — Other Ambulatory Visit: Payer: Self-pay

## 2019-08-25 MED ORDER — METOPROLOL SUCCINATE ER 100 MG PO TB24: ORAL_TABLET | ORAL | 2 refills | Status: DC

## 2019-08-25 MED ORDER — FUROSEMIDE 20 MG PO TABS: ORAL_TABLET | ORAL | 3 refills | Status: DC

## 2019-08-26 ENCOUNTER — Other Ambulatory Visit: Payer: Self-pay | Admitting: Cardiovascular Disease

## 2019-08-26 ENCOUNTER — Other Ambulatory Visit: Payer: Self-pay | Admitting: Family Medicine

## 2019-08-26 DIAGNOSIS — K219 Gastro-esophageal reflux disease without esophagitis: Secondary | ICD-10-CM

## 2019-08-26 DIAGNOSIS — G8929 Other chronic pain: Secondary | ICD-10-CM

## 2019-08-26 NOTE — Telephone Encounter (Signed)
Pt calling stating that she would like for her medication to be resent to [facility] mail order pharmacy. Please address

## 2019-08-27 ENCOUNTER — Other Ambulatory Visit: Payer: Self-pay

## 2019-08-27 MED ORDER — DILTIAZEM HCL ER COATED BEADS 240 MG PO CP24: 240.0000 mg | ORAL_CAPSULE | Freq: Every day | ORAL | 2 refills | Status: DC

## 2019-08-27 MED ORDER — FENOFIBRATE 160 MG PO TABS: 160.0000 mg | ORAL_TABLET | Freq: Every day | ORAL | 2 refills | Status: DC

## 2019-08-27 MED ORDER — ATORVASTATIN CALCIUM 40 MG PO TABS: 40.0000 mg | ORAL_TABLET | Freq: Every day | ORAL | 2 refills | Status: DC

## 2019-08-28 ENCOUNTER — Encounter: Payer: Self-pay | Admitting: Endocrinology

## 2019-08-30 ENCOUNTER — Other Ambulatory Visit: Payer: Self-pay

## 2019-08-30 ENCOUNTER — Encounter: Payer: Self-pay | Admitting: Endocrinology

## 2019-08-30 DIAGNOSIS — E039 Hypothyroidism, unspecified: Secondary | ICD-10-CM

## 2019-08-30 MED ORDER — OMEGA-3-ACID ETHYL ESTERS 1 G PO CAPS: 2.0000 | ORAL_CAPSULE | Freq: Two times a day (BID) | ORAL | 0 refills | Status: DC

## 2019-08-30 MED ORDER — LEVOTHYROXINE SODIUM 50 MCG PO TABS: 50.0000 ug | ORAL_TABLET | Freq: Every day | ORAL | 0 refills | Status: DC

## 2019-08-30 NOTE — Telephone Encounter (Signed)
LOV 09/17/18. Notes indicate follow up in one year. Next appt as follows:  Date: 11/25/2019 Status: Sch  Time: 3:30 PM Length: 15  Visit Type: FOLLOW UP 15 [1002]     Do you still want to refill for 90 days?

## 2019-08-31 NOTE — Telephone Encounter (Signed)
Per pt and Dr. Cordelia Pen request, please call pt to schedule VV

## 2019-08-31 NOTE — Telephone Encounter (Signed)
Please advise if VV is permissable

## 2019-09-03 ENCOUNTER — Other Ambulatory Visit: Payer: Self-pay

## 2019-09-07 ENCOUNTER — Telehealth: Payer: Self-pay

## 2019-09-07 NOTE — Telephone Encounter (Signed)
Dr. Loanne Drilling has requested this patient come in before her April appointment so I contacted her to get this changed-Spoke to the patients husband and he stated he did not feel comfortable scheduling an appointment for his wife so he stated he would have her call to re-schedule once she gets up

## 2019-09-08 ENCOUNTER — Other Ambulatory Visit: Payer: Self-pay

## 2019-09-08 MED ORDER — OMEGA-3-ACID ETHYL ESTERS 1 G PO CAPS: 2.0000 | ORAL_CAPSULE | Freq: Two times a day (BID) | ORAL | 0 refills | Status: DC

## 2019-09-10 ENCOUNTER — Ambulatory Visit: Payer: Medicare Other

## 2019-09-16 ENCOUNTER — Ambulatory Visit: Payer: Medicare Other | Admitting: Endocrinology

## 2019-09-22 ENCOUNTER — Telehealth: Payer: Self-pay

## 2019-09-22 ENCOUNTER — Encounter: Payer: Self-pay | Admitting: Endocrinology

## 2019-09-22 NOTE — Telephone Encounter (Signed)
Called pt as requested through pt's mychart portal to schedule a telephone call visit with Dr. Loanne Drilling. Pt's son answered the phone and stated that the pt was asleep and he requested that this office call back later today or another day.

## 2019-09-22 NOTE — Telephone Encounter (Signed)
Please call pt to schedule appt

## 2019-09-24 ENCOUNTER — Other Ambulatory Visit: Payer: Self-pay

## 2019-09-24 ENCOUNTER — Encounter: Payer: Self-pay | Admitting: Endocrinology

## 2019-09-24 ENCOUNTER — Other Ambulatory Visit: Payer: Self-pay | Admitting: Endocrinology

## 2019-09-24 ENCOUNTER — Ambulatory Visit (INDEPENDENT_AMBULATORY_CARE_PROVIDER_SITE_OTHER): Payer: Medicare Other | Admitting: Endocrinology

## 2019-09-24 ENCOUNTER — Telehealth: Payer: Medicare Other | Admitting: Nurse Practitioner

## 2019-09-24 DIAGNOSIS — E039 Hypothyroidism, unspecified: Secondary | ICD-10-CM | POA: Diagnosis not present

## 2019-09-24 DIAGNOSIS — G8929 Other chronic pain: Secondary | ICD-10-CM

## 2019-09-24 DIAGNOSIS — R5081 Fever presenting with conditions classified elsewhere: Secondary | ICD-10-CM

## 2019-09-24 NOTE — Progress Notes (Signed)
Subjective:    Patient ID: Regina Horton, female    DOB: 12-01-1950, 69 y.o.   MRN: [id number]  HPI  telehealth visit today via doxy video visit.  Alternatives to telehealth are presented to this patient, and the patient agrees to the telehealth visit. Pt is advised of the cost of the visit, and agrees to this, also.   Patient is at home, and I am at the office.   Persons attending the telehealth visit: the patient and I Pt returns for f/u of a small multinodular goiter and hypothyroidism (both dx'ed 2011; f/u US in 2019 showed nodules were again too small to need bx; she has been on synthroid since 2011).   pt states she feels well in general, except for back pain Past Medical History:  Diagnosis Date  . Allergic rhinitis   . Angina at rest [facility])   . Anxiety   . Aortic sclerosis   . Cataract 2016  . Chronic back pain   . Chronic constipation   . CKD (chronic kidney disease)   . Depression   . Fibromyalgia   . GERD (gastroesophageal reflux disease)   . Hyperlipidemia   . Hypertension   . Hypertension   . Mitral regurgitation   . Osteoporosis   . Palpitations   . Thyroid disease   . Tricuspid regurgitation     Past Surgical History:  Procedure Laterality Date  . BACK SURGERY  2005  . BREAST LUMPECTOMY  1970   right   . CATARACT EXTRACTION W/PHACO Right 06/15/2015   Procedure: CATARACT EXTRACTION PHACO AND INTRAOCULAR LENS PLACEMENT (IOC);  Surgeon: Tonny Branch, MD;  Location: [facility];  Service: Ophthalmology;  Laterality: Right;  CDE: 10.33  . CATARACT EXTRACTION W/PHACO Left 07/17/2015   Procedure: CATARACT EXTRACTION PHACO AND INTRAOCULAR LENS PLACEMENT (IOC);  Surgeon: Tonny Branch, MD;  Location: [facility];  Service: Ophthalmology;  Laterality: Left;  CDE:7.60  . NM MYOCAR PERF WALL MOTION  02/16/2009   normal  . ORIF ANKLE FRACTURE Left 10/26/2014   Procedure: OPEN REDUCTION INTERNAL FIXATION (ORIF) ANKLE FRACTURE;  Surgeon: Sanjuana Kava, MD;  Location: [facility];  Service:  Orthopedics;  Laterality: Left;  . TONSILLECTOMY  childhood   . TUBAL LIGATION  1973  . US ECHOCARDIOGRAPHY  12/19/2008   mild mitral annular ca+,AOV mildly sclerotic,mild MR & TR    Social History   Socioeconomic History  . Marital status: Married    Spouse name: Not on file  . Number of children: 2  . Years of education: Not on file  . Highest education level: Not on file  Occupational History  . Occupation: disabled   Tobacco Use  . Smoking status: Former Smoker    Packs/day: 1.00    Years: 20.00    Pack years: 20.00    Types: Cigarettes    Quit date: 12/24/2003    Years since quitting: 15.7  . Smokeless tobacco: Never Used  Substance and Sexual Activity  . Alcohol use: No  . Drug use: Yes    Types: Marijuana    Comment: boils marijuana in water and makes a tea about 3 times a week  . Sexual activity: Not Currently    Birth control/protection: Surgical  Other Topics Concern  . Not on file  Social History Narrative  . Not on file   Social Determinants of Health   Financial Resource Strain:   . Difficulty of Paying Living Expenses: Not on file  Food Insecurity:   . Worried  About Running Out of Food in the Last Year: Not on file  . Ran Out of Food in the Last Year: Not on file  Transportation Needs:   . Lack of Transportation (Medical): Not on file  . Lack of Transportation (Non-Medical): Not on file  Physical Activity:   . Days of Exercise per Week: Not on file  . Minutes of Exercise per Session: Not on file  Stress:   . Feeling of Stress : Not on file  Social Connections:   . Frequency of Communication with Friends and Family: Not on file  . Frequency of Social Gatherings with Friends and Family: Not on file  . Attends Religious Services: Not on file  . Active Member of Clubs or Organizations: Not on file  . Attends Archivist Meetings: Not on file  . Marital Status: Not on file  Intimate Partner Violence:   . Fear of Current or Ex-Partner: Not on  file  . Emotionally Abused: Not on file  . Physically Abused: Not on file  . Sexually Abused: Not on file    Current Outpatient Medications on File Prior to Visit  Medication Sig Dispense Refill  . atorvastatin (LIPITOR) 40 MG tablet Take 1 tablet (40 mg total) by mouth daily. 90 tablet 2  . b complex vitamins tablet Take 1 tablet by mouth daily. 1000 units    . Carboxymethylcellul-Glycerin (REFRESH RELIEVA OP) Apply 1 drop to eye. 6 times per day    . Cholecalciferol (VITAMIN D) 1000 UNITS capsule Take 2,000 Units by mouth 2 (two) times daily.     . cyclobenzaprine (FLEXERIL) 5 MG tablet Take 5 mg by mouth 2 (two) times daily as needed.    . diltiazem (CARTIA XT) 240 MG 24 hr capsule Take 1 capsule (240 mg total) by mouth daily. 90 capsule 2  . DULoxetine (CYMBALTA) 60 MG capsule Take 1 capsule (60 mg total) by mouth 2 (two) times daily. (Needs to be seen before next refill) 180 capsule 0  . fenofibrate 160 MG tablet Take 1 tablet (160 mg total) by mouth daily. 90 tablet 2  . fluticasone (FLONASE) 50 MCG/ACT nasal spray Place 2 sprays into both nostrils daily. (Needs to be seen before next refill) 48 g 0  . furosemide (LASIX) 20 MG tablet Take 1 tablet by mouth every 3 days. 30 tablet 3  . gabapentin (NEURONTIN) 600 MG tablet TAKE ONE-HALF (1/2) TABLET EVERY MORNING AND 1 TABLET EVERY EVENING (Needs to be seen before next refill) 135 tablet 0  . hydrOXYzine (ATARAX/VISTARIL) 25 MG tablet Take 25-50 mg by mouth at bedtime.    . meloxicam (MOBIC) 15 MG tablet Take 15 mg by mouth daily as needed.    . metoprolol succinate (TOPROL-XL) 100 MG 24 hr tablet Take 1 tablet (100 mg total) by mouth every morning and take 0.5 tablet (50 mg total) by mouth every evening. 135 tablet 2  . omega-3 acid ethyl esters (LOVAZA) 1 g capsule Take 2 capsules (2 g total) by mouth 2 (two) times daily. 360 capsule 0  . oxyCODONE-acetaminophen (PERCOCET) 7.5-325 MG tablet Take 1 tablet by mouth every 8 (eight) hours as  needed.    . pantoprazole (PROTONIX) 40 MG tablet Take 1 tablet (40 mg total) by mouth daily. (Needs to be seen before next refill) 90 tablet 0   No current facility-administered medications on file prior to visit.    Allergies  Allergen Reactions  . Xyzal [Levocetirizine Dihydrochloride] Hives and Itching  .  Codeine     Pt states she does not have any problems with codeine.  . Nickel Rash    Family History  Problem Relation Age of Onset  . Rheum arthritis Mother   . Osteoporosis Mother   . Heart disease Father   . Diabetes Father   . Hyperlipidemia Father   . Bladder Cancer Father   . Heart disease Brother   . Arthritis Brother     There were no vitals taken for this visit.   Review of Systems  She does not notice any swelling at the neck.      Objective:   Physical Exam   Lab Results  Component Value Date   TSH 1.88 09/17/2018      Assessment & Plan:  MNG: euthyroid.    Patient Instructions  Blood tests are requested for you today.  We'll let you know about the results.   Please do at [facility]. You can skip the ultrasound for now.   Please come back for a follow-up appointment in 1 year.

## 2019-09-24 NOTE — Progress Notes (Signed)
Based on what you shared with me it looks like you have back pain with an associated fever,that should be evaluated in a face to face office visit. Due to associated fever, and the fact that you stated the pain is severe, it is my recommendation that you be evaluated face to face. You need to have some blood work done as well as urine checked and possible scan done. I cannot do pain medication in an evisit and  You will need to be seen for pain medication.    NOTE: If you entered your credit card information for this eVisit, you will not be charged. You may see a "hold" on your card for the $35 but that hold will drop off and you will not have a charge processed.  If you are having a true medical emergency please call 911.     For an urgent face to face visit, [facility] has four urgent care centers for your convenience:   . [facility]    [phone]                  Get Driving Directions  [id number] [place], [place] [address] . 10 am to 8 pm Monday-Friday . 12 pm to 8 pm Saturday-Sunday   . [facility] at [place]                  Get Driving Directions  [id number] [place], [place], [place] [address] . 8 am to 8 pm Monday-Friday . 9 am to 6 pm Saturday . 11 am to 6 pm Sunday   . [facility] at [place]                  Get Driving Directions   [address] [place], [place] [address] . 8 am to 8 pm Monday-Friday . 8 am to 4 pm Saturday-Sunday    . [facility] at [place]                    Get Driving Directions  [id number]  [address] [place], [place] [address]  . Monday-Friday, 12 PM to 6 PM    Your e-visit answers were reviewed by a board certified advanced clinical practitioner to complete your personal care plan.  Thank you for using e-Visits.

## 2019-09-24 NOTE — Patient Instructions (Signed)
Blood tests are requested for you today.  We'll let you know about the results.   Please do at [facility]. You can skip the ultrasound for now.   Please come back for a follow-up appointment in 1 year.

## 2019-09-25 ENCOUNTER — Other Ambulatory Visit: Payer: Self-pay | Admitting: Endocrinology

## 2019-09-25 DIAGNOSIS — E039 Hypothyroidism, unspecified: Secondary | ICD-10-CM

## 2019-10-01 DIAGNOSIS — Z23 Encounter for immunization: Secondary | ICD-10-CM | POA: Diagnosis not present

## 2019-10-03 ENCOUNTER — Encounter: Payer: Self-pay | Admitting: Endocrinology

## 2019-10-04 ENCOUNTER — Other Ambulatory Visit: Payer: Self-pay

## 2019-10-04 DIAGNOSIS — E039 Hypothyroidism, unspecified: Secondary | ICD-10-CM

## 2019-10-04 MED ORDER — LEVOTHYROXINE SODIUM 50 MCG PO TABS: 50.0000 ug | ORAL_TABLET | Freq: Every day | ORAL | 3 refills | Status: DC

## 2019-10-29 DIAGNOSIS — Z23 Encounter for immunization: Secondary | ICD-10-CM | POA: Diagnosis not present

## 2019-11-09 DIAGNOSIS — G4700 Insomnia, unspecified: Secondary | ICD-10-CM | POA: Diagnosis not present

## 2019-11-09 DIAGNOSIS — M545 Low back pain: Secondary | ICD-10-CM | POA: Diagnosis not present

## 2019-11-09 DIAGNOSIS — M961 Postlaminectomy syndrome, not elsewhere classified: Secondary | ICD-10-CM | POA: Diagnosis not present

## 2019-11-09 DIAGNOSIS — M130 Polyarthritis, unspecified: Secondary | ICD-10-CM | POA: Insufficient documentation

## 2019-11-09 DIAGNOSIS — R252 Cramp and spasm: Secondary | ICD-10-CM | POA: Insufficient documentation

## 2019-11-09 DIAGNOSIS — Z79891 Long term (current) use of opiate analgesic: Secondary | ICD-10-CM | POA: Diagnosis not present

## 2019-11-09 DIAGNOSIS — T819XXA Unspecified complication of procedure, initial encounter: Secondary | ICD-10-CM | POA: Insufficient documentation

## 2019-11-09 DIAGNOSIS — M4725 Other spondylosis with radiculopathy, thoracolumbar region: Secondary | ICD-10-CM | POA: Diagnosis not present

## 2019-11-13 ENCOUNTER — Other Ambulatory Visit: Payer: Self-pay | Admitting: Family Medicine

## 2019-11-13 DIAGNOSIS — G8929 Other chronic pain: Secondary | ICD-10-CM

## 2019-11-25 ENCOUNTER — Ambulatory Visit: Payer: Medicare Other | Admitting: Endocrinology

## 2019-12-07 IMAGING — CT CT CERVICAL SPINE W/ CM
3 of 14 series · 7 of 33 positions shown, 8 images · IV contrast (Omni 300)
Comparison: none

CLINICAL DATA: Previous lumbar fusion. Spondylolisthesis. Bilateral
shoulder pain. Bilateral hip pain.
TECHNIQUE: Contiguous axial images were obtained through the Cervical,
Thoracic, and Lumbar spine after the intrathecal infusion of
infusion. Coronal and sagittal reconstructions were obtained of the
axial image sets.

[Series 14: l-spine 2.0 (person_name) (person_name) · axial · 0.29mm/px · z∈[-640,-516]mm · 3 of 126 slices shown, 4 images]
[im 32/126  soft-tissue]
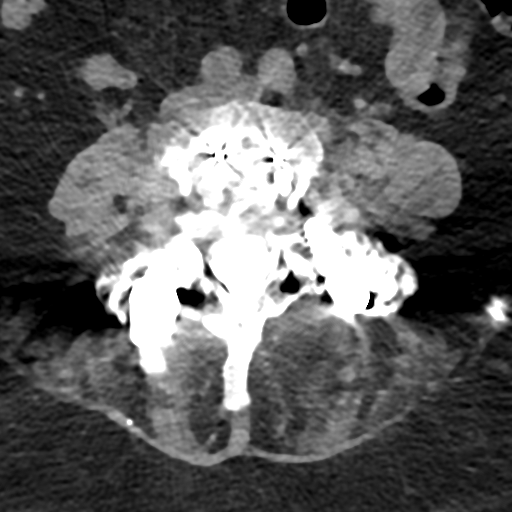
[im 32/126  bone]
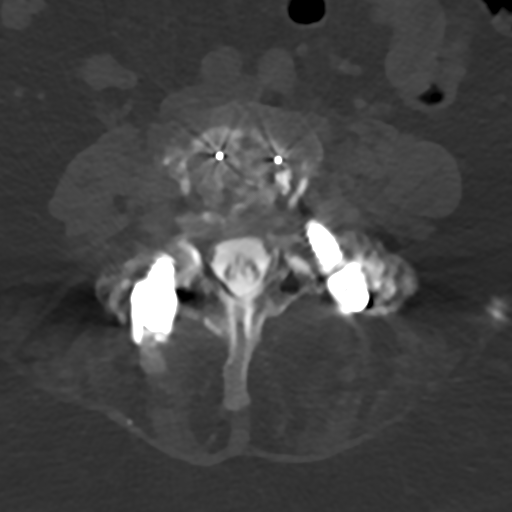
[im 63/126  bone]
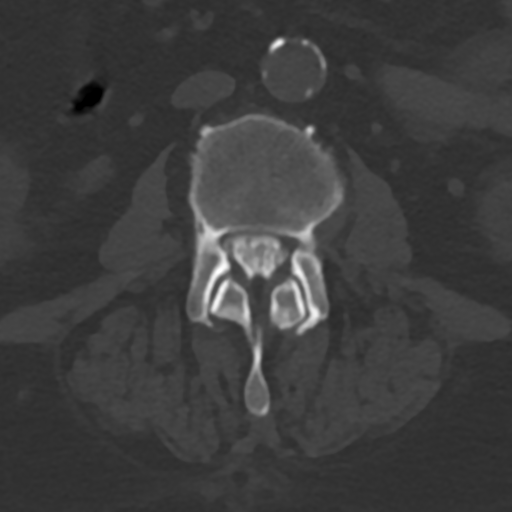
[im 94/126  bone]
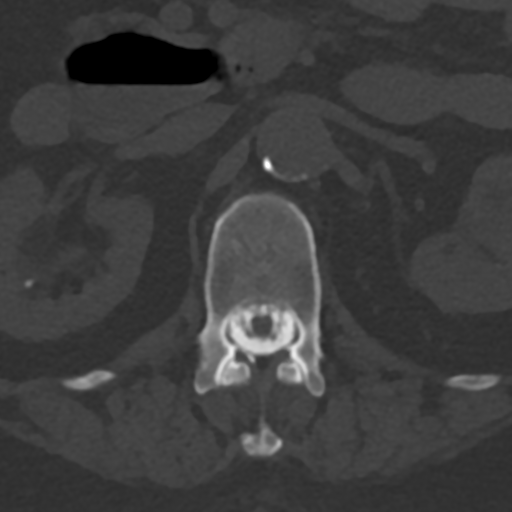

[Series 19: l-spine 2.0 sag bone · sagittal · 0.37mm/px · 3 of 61 slices shown]
[im 16/61  bone]
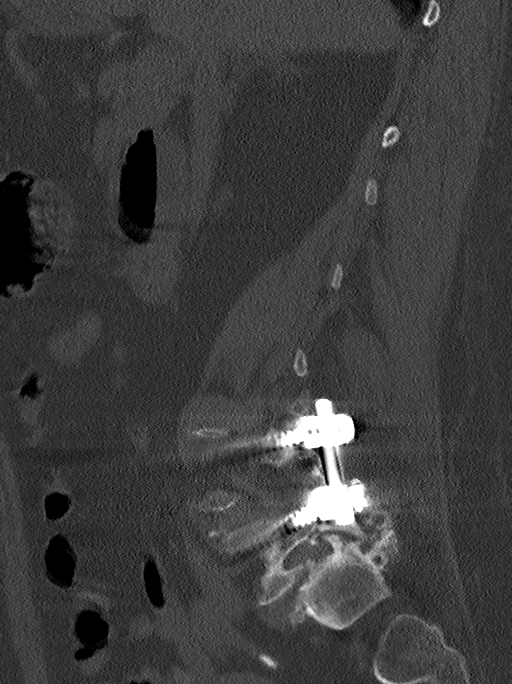
[im 31/61  bone]
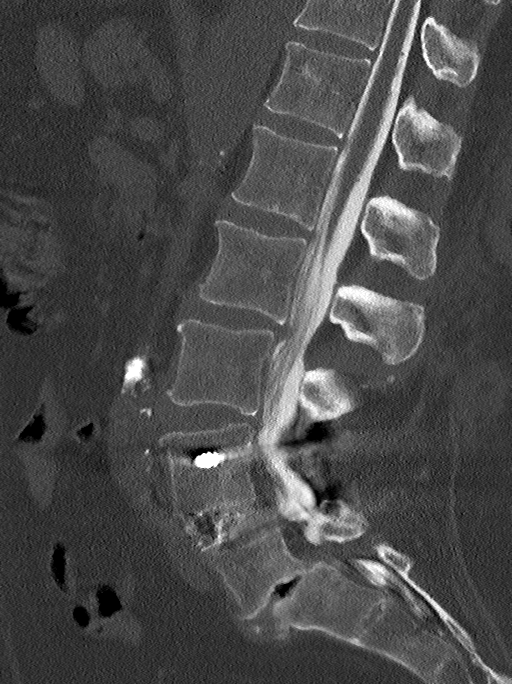
[im 46/61  bone]
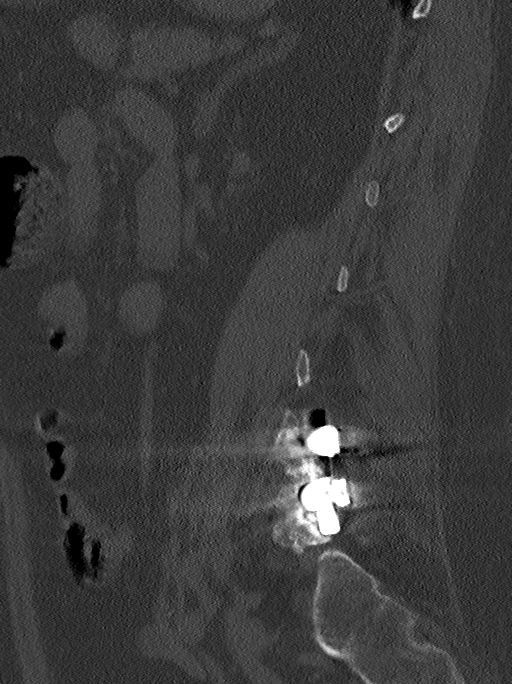

[Series 20: l-spine 2.0 coronal · coronal · 0.23mm/px · 1 of 92 slices shown]
[im 46/92  bone]
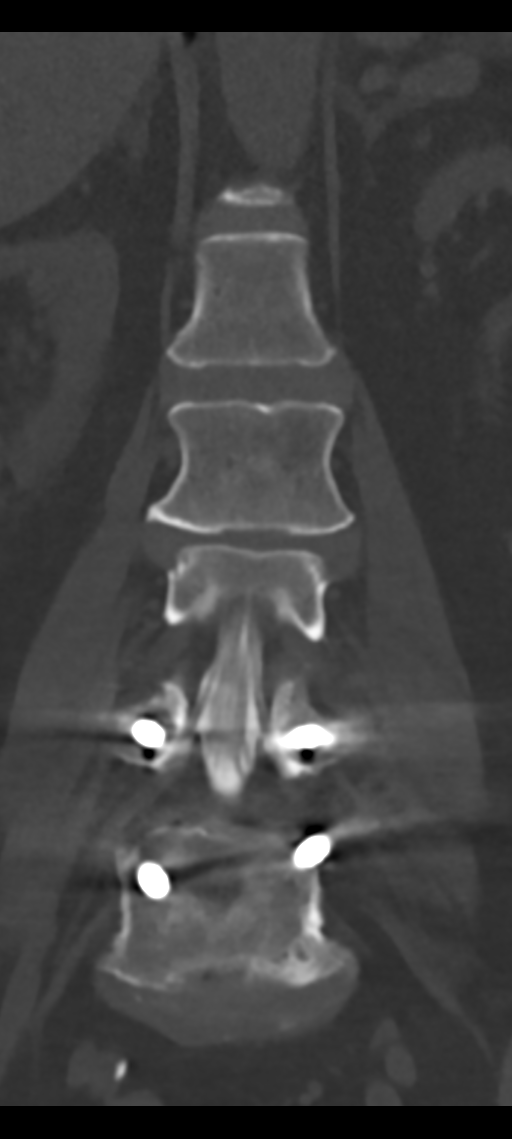

[7 of 33 positions shown; findings below may reference images not displayed]

FLUOROSCOPY TIME:  1 minutes 30 seconds. 1326.15 micro gray meter
squared

PROCEDURE:
CERVICAL AND LUMBAR AND THORACIC MYELOGRAM

CT CERVICAL MYELOGRAM

CT LUMBAR MYELOGRAM

Lumbar puncture and contrast injection were done by Dr. Koker at
the L1-2 space.

I personally performed  acquisition of the myelogram images.
FINDINGS: CERVICAL, thoracic AND LUMBAR MYELOGRAM FINDINGS:

Lumbar region: L1-2 appears normal. At L2-3, there is retrolisthesis
of 3 mm in rocking motion with flexion extension views. There is
mild stenosis of both lateral recesses right more than left. L3-4
appears normal. At L4-5 there has been previous posterior
decompression, diskectomy and fusion which appears solid without
stenosis or motion. At L5-S1, there is anterolisthesis of 12 mm.
There is disc degeneration with complete loss of disc height. There
is severe stenosis with essential block to the passage of contrast,
even after standing and bending. No significant motion occurs at
this level with bending.

Thoracic region: Small anterior extradural defect at T8-9.

Cervical region: No canal stenosis. No abnormality of root sleeve
filling. Small anterior extradural defects at C5-6 and C6-7. Mid
cervical facet osteoarthritis.

CT CERVICAL MYELOGRAM FINDINGS:

Foramen magnum is widely patent. Ordinary osteoarthritis at the C1-2
articulation but no encroachment upon the neural spaces.

C2-3:  Normal interspace.

C3-4: Minimal left-sided uncovertebral prominence and facet
degeneration. No stenosis.

C4-5: Mild uncovertebral hypertrophy. Facet degeneration worse on
the left. No central canal stenosis. Mild bilateral foraminal
stenosis, more on the left.

C5-6: Spondylosis with endplate osteophytes and bulging of the disc.
Narrowing of the ventral subarachnoid space but no compressive
effect upon the cord. Facet osteoarthritis on the left. Foraminal
encroachment by osteophytes on the left that could affect the left
C6 nerve.

C6-7: Spondylosis with disc space narrowing, endplate osteophytes
and bulging of the disc. Narrowing of the ventral subarachnoid space
but no compressive effect upon the cord. Mild facet degeneration
left more than right. Mild bilateral foraminal narrowing because of
osteophytic encroachment.

C7-T1:  Normal interspace.

T1-2:  Normal interspace.

CT LUMBAR MYELOGRAM FINDINGS:

T11-12: Normal.

T12-L1:  Normal.

L1-2:  Normal.

L2-3: Retrolisthesis of 2 mm. Bulging of the disc. Mild facet and
ligamentous hypertrophy. Mild stenosis of both lateral recesses.

L3-4: Minimal disc bulge. Mild facet hypertrophy. Mild foraminal
narrowing left more than right without definite neural compression.

L4-5: Previous PLIF. Solid fusion with wide patency of the canal and
foramina.

L5-S1: Advanced bilateral facet arthropathy with 1 cm of
anterolisthesis. Disc degeneration with loss of disc height.
Stenosis of the central canal, lateral recesses and neural foramina
that could cause neural compression on either or both sides.
IMPRESSION: Lumbar region: Previous PLIF L4-5 has a good appearance. L5-S1
degenerative facet arthropathy with 1 cm of anterolisthesis.
Degenerative disc disease. Stenosis of the spinal canal and both
neural foramina that could cause neural compression on either or
both sides. L3-4 disc bulge with mild stenosis of the lateral
recesses and foramina left more than right. L2-3 retrolisthesis of 2
mm with bulging of the disc and mild bilateral lateral recess
stenosis. Abnormal rocking motion with flexion extension.

Cervical region: C4-5: Left-sided facet degeneration and
uncovertebral hypertrophy with mild foraminal narrowing left more
than right.

C5-6: Degenerative spondylosis and left-sided facet arthropathy.
Foraminal stenosis bilaterally, left worse than right that could
compress in particular the left C6 nerve.

C6-7: Spondylosis this sets with foraminal narrowing that could
possibly affect either C7 nerve.

## 2019-12-15 ENCOUNTER — Ambulatory Visit (INDEPENDENT_AMBULATORY_CARE_PROVIDER_SITE_OTHER): Payer: Medicare Other | Admitting: Family Medicine

## 2019-12-15 ENCOUNTER — Other Ambulatory Visit: Payer: Self-pay

## 2019-12-15 VITALS — BP 133/58 | HR 53 | Temp 98.3°F | Ht 65.0 in | Wt 247.0 lb

## 2019-12-15 DIAGNOSIS — R600 Localized edema: Secondary | ICD-10-CM

## 2019-12-15 DIAGNOSIS — E559 Vitamin D deficiency, unspecified: Secondary | ICD-10-CM | POA: Diagnosis not present

## 2019-12-15 DIAGNOSIS — Z1231 Encounter for screening mammogram for malignant neoplasm of breast: Secondary | ICD-10-CM

## 2019-12-15 DIAGNOSIS — E039 Hypothyroidism, unspecified: Secondary | ICD-10-CM | POA: Diagnosis not present

## 2019-12-15 DIAGNOSIS — G8929 Other chronic pain: Secondary | ICD-10-CM | POA: Diagnosis not present

## 2019-12-15 DIAGNOSIS — N184 Chronic kidney disease, stage 4 (severe): Secondary | ICD-10-CM

## 2019-12-15 DIAGNOSIS — Z79899 Other long term (current) drug therapy: Secondary | ICD-10-CM | POA: Diagnosis not present

## 2019-12-15 DIAGNOSIS — I10 Essential (primary) hypertension: Secondary | ICD-10-CM

## 2019-12-15 DIAGNOSIS — D631 Anemia in chronic kidney disease: Secondary | ICD-10-CM | POA: Diagnosis not present

## 2019-12-15 DIAGNOSIS — E785 Hyperlipidemia, unspecified: Secondary | ICD-10-CM

## 2019-12-15 DIAGNOSIS — M5440 Lumbago with sciatica, unspecified side: Secondary | ICD-10-CM | POA: Diagnosis not present

## 2019-12-15 DIAGNOSIS — R809 Proteinuria, unspecified: Secondary | ICD-10-CM | POA: Diagnosis not present

## 2019-12-15 DIAGNOSIS — E782 Mixed hyperlipidemia: Secondary | ICD-10-CM | POA: Diagnosis not present

## 2019-12-15 NOTE — Patient Instructions (Signed)
Go to [facility] to get fitted for compression hose You had labs performed today.  You will be contacted with the results of the labs once they are available, usually in the next 3 business days for routine lab work.  If you have an active my chart account, they will be released to your MyChart.  If you prefer to have these labs released to you via telephone, please let us know.  If you had a pap smear or biopsy performed, expect to be contacted in about 7-10 days.

## 2019-12-15 NOTE — Progress Notes (Signed)
Subjective: CC: chronic low back pain, CKD, HTN, HLD PCP: [name], DO Regina Horton is a 69 y.o. female presenting to clinic today for:  1. HTN, HLD, CKD3b/4 Patient reports compliance with medications.  She reports regular follow up with cardiology, nephrology (next visit Friday), though she has not seen them in person.  She reports LE edema.  She has been taking her Lasix every few days to try and help with swelling.  She does not have compression hose but would be interested in them.  2. Chronic low back pain She continues to follow with Dr Merlene Laughter every 3 months.  She continues to require opioids to stay functional.  She also gets caudal injections regularly.  Utilizing walker for ambulation.  Does not report falls.  3. Hypothyroidism Patient had virtual visit with Dr Loanne Drilling.  She has not gotten labs for any of her specialists yet because she has been sheltering at home.  Reports compliance with medications.  Has had edema as above.   ROS: Per HPI  Allergies  Allergen Reactions  . Xyzal [Levocetirizine Dihydrochloride] Hives and Itching  . Codeine     Pt states she does not have any problems with codeine.  . Nickel Rash   Past Medical History:  Diagnosis Date  . Allergic rhinitis   . Angina at rest [facility])   . Anxiety   . Aortic sclerosis   . Cataract 2016  . Chronic back pain   . Chronic constipation   . CKD (chronic kidney disease)   . Depression   . Fibromyalgia   . GERD (gastroesophageal reflux disease)   . Hyperlipidemia   . Hypertension   . Hypertension   . Mitral regurgitation   . Osteoporosis   . Palpitations   . Thyroid disease   . Tricuspid regurgitation     Current Outpatient Medications:  .  atorvastatin (LIPITOR) 40 MG tablet, Take 1 tablet (40 mg total) by mouth daily., Disp: 90 tablet, Rfl: 2 .  b complex vitamins tablet, Take 1 tablet by mouth daily. 1000 units, Disp: , Rfl:  .  Carboxymethylcellul-Glycerin (REFRESH RELIEVA  OP), Apply 1 drop to eye. 6 times per day, Disp: , Rfl:  .  Cholecalciferol (VITAMIN D) 1000 UNITS capsule, Take 2,000 Units by mouth 2 (two) times daily. , Disp: , Rfl:  .  cyclobenzaprine (FLEXERIL) 5 MG tablet, Take 5 mg by mouth 2 (two) times daily as needed., Disp: , Rfl:  .  diltiazem (CARTIA XT) 240 MG 24 hr capsule, Take 1 capsule (240 mg total) by mouth daily., Disp: 90 capsule, Rfl: 2 .  DULoxetine (CYMBALTA) 60 MG capsule, Take 1 capsule (60 mg total) by mouth 2 (two) times daily. (Needs to be seen before next refill), Disp: 180 capsule, Rfl: 0 .  fenofibrate 160 MG tablet, Take 1 tablet (160 mg total) by mouth daily., Disp: 90 tablet, Rfl: 2 .  fluticasone (FLONASE) 50 MCG/ACT nasal spray, Place 2 sprays into both nostrils daily. (Needs to be seen before next refill), Disp: 48 g, Rfl: 0 .  furosemide (LASIX) 20 MG tablet, Take 1 tablet by mouth every 3 days., Disp: 30 tablet, Rfl: 3 .  gabapentin (NEURONTIN) 600 MG tablet, TAKE 1/2 TABLET EVERY MORNING AND 1 TABLET EVERY EVENING (NEEDS TO BE SEEN BEFORE NEXT REFILL), Disp: 135 tablet, Rfl: 0 .  hydrOXYzine (ATARAX/VISTARIL) 25 MG tablet, Take 25-50 mg by mouth at bedtime., Disp: , Rfl:  .  levothyroxine (SYNTHROID) 50 MCG tablet,  Take 1 tablet (50 mcg total) by mouth daily., Disp: 90 tablet, Rfl: 3 .  meloxicam (MOBIC) 15 MG tablet, Take 15 mg by mouth daily as needed., Disp: , Rfl:  .  metoprolol succinate (TOPROL-XL) 100 MG 24 hr tablet, Take 1 tablet (100 mg total) by mouth every morning and take 0.5 tablet (50 mg total) by mouth every evening., Disp: 135 tablet, Rfl: 2 .  omega-3 acid ethyl esters (LOVAZA) 1 g capsule, Take 2 capsules (2 g total) by mouth 2 (two) times daily., Disp: 360 capsule, Rfl: 0 .  oxyCODONE-acetaminophen (PERCOCET) 7.5-325 MG tablet, Take 1 tablet by mouth every 8 (eight) hours as needed., Disp: , Rfl:  .  pantoprazole (PROTONIX) 40 MG tablet, Take 1 tablet (40 mg total) by mouth daily. (Needs to be seen before  next refill), Disp: 90 tablet, Rfl: 0 Social History   Socioeconomic History  . Marital status: Married    Spouse name: Not on file  . Number of children: 2  . Years of education: Not on file  . Highest education level: Not on file  Occupational History  . Occupation: disabled   Tobacco Use  . Smoking status: Former Smoker    Packs/day: 1.00    Years: 20.00    Pack years: 20.00    Types: Cigarettes    Quit date: 12/24/2003    Years since quitting: 15.9  . Smokeless tobacco: Never Used  Substance and Sexual Activity  . Alcohol use: No  . Drug use: Yes    Types: Marijuana    Comment: boils marijuana in water and makes a tea about 3 times a week  . Sexual activity: Not Currently    Birth control/protection: Surgical  Other Topics Concern  . Not on file  Social History Narrative  . Not on file   Social Determinants of Health   Financial Resource Strain:   . Difficulty of Paying Living Expenses:   Food Insecurity:   . Worried About Charity fundraiser in the Last Year:   . Arboriculturist in the Last Year:   Transportation Needs:   . Film/video editor (Medical):   Marland Kitchen Lack of Transportation (Non-Medical):   Physical Activity:   . Days of Exercise per Week:   . Minutes of Exercise per Session:   Stress:   . Feeling of Stress :   Social Connections:   . Frequency of Communication with Friends and Family:   . Frequency of Social Gatherings with Friends and Family:   . Attends Religious Services:   . Active Member of Clubs or Organizations:   . Attends Archivist Meetings:   Marland Kitchen Marital Status:   Intimate Partner Violence:   . Fear of Current or Ex-Partner:   . Emotionally Abused:   Marland Kitchen Physically Abused:   . Sexually Abused:    Family History  Problem Relation Age of Onset  . Rheum arthritis Mother   . Osteoporosis Mother   . Heart disease Father   . Diabetes Father   . Hyperlipidemia Father   . Bladder Cancer Father   . Heart disease Brother   .  Arthritis Brother     Objective: Office vital signs reviewed. BP (!) 133/58   Pulse (!) 53   Temp 98.3 F (36.8 C) (Temporal)   Ht 5\' 5"  (1.651 m)   Wt 247 lb (112 kg)   SpO2 97%   BMI 41.10 kg/m   Physical Examination:  General: Awake, alert, well  nourished, No acute distress HEENT: Normal, sclera white, MMM. No goiter. No exophthalmos  Cardio: regular rate and rhythm, S1S2 heard, no murmurs appreciated Pulm: mild intermittent bibasilar rales that improve with deep breathing, no wheezes, rhonchi; normal work of breathing on room air Extremities: warm, well perfused, 2+ pitting edema to bilateral mid shins, No cyanosis or clubbing; +1 pulses bilaterally MSK: antalgic gait and station; utilizing walker for ambulation Skin: dry; intact; no rashes or lesions; normal temperature Neuro: no tremor  Assessment/ Plan: 68 y.o. female   1. Stage 4 chronic kidney disease ([place]) Labs ordered and will be cc'd to specialists accordingly for follow up - Renal Function Panel - CBC - PTH, Intact and Calcium - IBC + Ferritin - Microalbumin / creatinine urine ratio - Compression stockings  2. Chronic bilateral low back pain with sciatica, sciatica laterality unspecified Continue to see Dr Merlene Laughter as scheduled - Hepatic Function Panel  3. Essential hypertension Controlled - Renal Function Panel - Hepatic Function Panel  4. Hyperlipidemia LDL goal <100 nonfasting labs - Lipid Panel  5. Vitamin D deficiency - VITAMIN D 25 Hydroxy (Vit-D Deficiency, Fractures)  6. Hypothyroidism, unspecified type asymptomatic - Thyroid Panel With TSH  7. Bilateral lower extremity edema Stockings ordered and faxed to [facility] - Compression stockings  8. Encounter for screening mammogram for malignant neoplasm of breast Will get set up at [facility]; Future  She will call Dr Laural Golden to schedule colonscopy.  No orders of the defined types were placed in this  encounter.  No orders of the defined types were placed in this encounter.    [name], DO [place] [phone]

## 2019-12-16 ENCOUNTER — Other Ambulatory Visit: Payer: Self-pay | Admitting: Endocrinology

## 2019-12-16 DIAGNOSIS — Z79899 Other long term (current) drug therapy: Secondary | ICD-10-CM | POA: Diagnosis not present

## 2019-12-16 DIAGNOSIS — R809 Proteinuria, unspecified: Secondary | ICD-10-CM | POA: Diagnosis not present

## 2019-12-16 LAB — CBC
Hematocrit: 40.2 % (ref 34.0–46.6)
Hemoglobin: 13.8 g/dL (ref 11.1–15.9)
MCH: 31.9 pg (ref 26.6–33.0)
MCHC: 34.3 g/dL (ref 31.5–35.7)
MCV: 93 fL (ref 79–97)
Platelets: 334 10*3/uL (ref 150–450)
RBC: 4.33 x10E6/uL (ref 3.77–5.28)
RDW: 13.6 % (ref 11.7–15.4)
WBC: 7 10*3/uL (ref 3.4–10.8)

## 2019-12-16 LAB — COMPREHENSIVE METABOLIC PANEL
ALT: 16 IU/L (ref 0–32)
AST: 28 IU/L (ref 0–40)
Albumin/Globulin Ratio: 1.9 (ref 1.2–2.2)
Albumin: 4.5 g/dL (ref 3.8–4.8)
Alkaline Phosphatase: 52 IU/L (ref 39–117)
BUN/Creatinine Ratio: 15 (ref 12–28)
BUN: 28 mg/dL — ABNORMAL HIGH (ref 8–27)
Bilirubin Total: 0.6 mg/dL (ref 0.0–1.2)
CO2: 24 mmol/L (ref 20–29)
Calcium: 10.4 mg/dL — ABNORMAL HIGH (ref 8.7–10.3)
Chloride: 106 mmol/L (ref 96–106)
Creatinine, Ser: 1.91 mg/dL — ABNORMAL HIGH (ref 0.57–1.00)
GFR calc Af Amer: 30 mL/min/{1.73_m2} — ABNORMAL LOW (ref >59)
GFR calc non Af Amer: 26 mL/min/{1.73_m2} — ABNORMAL LOW (ref >59)
Globulin, Total: 2.4 g/dL (ref 1.5–4.5)
Glucose: 113 mg/dL — ABNORMAL HIGH (ref 65–99)
Potassium: 4.4 mmol/L (ref 3.5–5.2)
Sodium: 145 mmol/L — ABNORMAL HIGH (ref 134–144)
Total Protein: 6.9 g/dL (ref 6.0–8.5)

## 2019-12-16 LAB — LIPID PANEL
Chol/HDL Ratio: 2.3 ratio (ref 0.0–4.4)
Cholesterol, Total: 136 mg/dL (ref 100–199)
HDL: 60 mg/dL (ref >39)
LDL Chol Calc (NIH): 55 mg/dL (ref 0–99)
Triglycerides: 117 mg/dL (ref 0–149)
VLDL Cholesterol Cal: 21 mg/dL (ref 5–40)

## 2019-12-16 LAB — TSH
TSH: 4.95 u[IU]/mL — ABNORMAL HIGH (ref 0.450–4.500)
TSH: 4.93 u[IU]/mL — ABNORMAL HIGH (ref 0.450–4.500)

## 2019-12-16 LAB — T4, FREE: Free T4: 1.55 ng/dL (ref 0.82–1.77)

## 2019-12-16 MED ORDER — LEVOTHYROXINE SODIUM 75 MCG PO TABS
75.0000 ug | ORAL_TABLET | Freq: Every day | ORAL | 3 refills | Status: DC
Start: 2019-12-16 — End: 2019-12-20

## 2019-12-17 DIAGNOSIS — I129 Hypertensive chronic kidney disease with stage 1 through stage 4 chronic kidney disease, or unspecified chronic kidney disease: Secondary | ICD-10-CM | POA: Diagnosis not present

## 2019-12-17 DIAGNOSIS — N184 Chronic kidney disease, stage 4 (severe): Secondary | ICD-10-CM | POA: Diagnosis not present

## 2019-12-17 DIAGNOSIS — Z79899 Other long term (current) drug therapy: Secondary | ICD-10-CM | POA: Diagnosis not present

## 2019-12-17 DIAGNOSIS — E559 Vitamin D deficiency, unspecified: Secondary | ICD-10-CM | POA: Diagnosis not present

## 2019-12-17 DIAGNOSIS — E6609 Other obesity due to excess calories: Secondary | ICD-10-CM | POA: Diagnosis not present

## 2019-12-20 ENCOUNTER — Other Ambulatory Visit: Payer: Self-pay

## 2019-12-20 ENCOUNTER — Other Ambulatory Visit: Payer: Self-pay | Admitting: *Deleted

## 2019-12-20 DIAGNOSIS — M5440 Lumbago with sciatica, unspecified side: Secondary | ICD-10-CM

## 2019-12-20 DIAGNOSIS — G8929 Other chronic pain: Secondary | ICD-10-CM

## 2019-12-20 DIAGNOSIS — E039 Hypothyroidism, unspecified: Secondary | ICD-10-CM

## 2019-12-20 MED ORDER — DULOXETINE HCL 60 MG PO CPEP: 60.0000 mg | ORAL_CAPSULE | Freq: Two times a day (BID) | ORAL | 1 refills | Status: DC

## 2019-12-20 MED ORDER — LEVOTHYROXINE SODIUM 75 MCG PO TABS: 75.0000 ug | ORAL_TABLET | Freq: Every day | ORAL | 3 refills | Status: DC

## 2019-12-21 ENCOUNTER — Other Ambulatory Visit: Payer: Self-pay | Admitting: Family Medicine

## 2019-12-29 ENCOUNTER — Other Ambulatory Visit: Payer: Self-pay

## 2019-12-29 ENCOUNTER — Ambulatory Visit (HOSPITAL_COMMUNITY)
Admission: RE | Admit: 2019-12-29 | Discharge: 2019-12-29 | Disposition: A | Discharge status: Home / Self Care | Payer: Medicare Other | Source: Ambulatory Visit | Attending: Family Medicine | Admitting: Family Medicine

## 2019-12-29 DIAGNOSIS — Z1231 Encounter for screening mammogram for malignant neoplasm of breast: Secondary | ICD-10-CM | POA: Insufficient documentation

## 2020-01-11 ENCOUNTER — Encounter: Payer: Self-pay | Admitting: Cardiovascular Disease

## 2020-01-11 ENCOUNTER — Other Ambulatory Visit: Payer: Self-pay

## 2020-01-11 ENCOUNTER — Ambulatory Visit (INDEPENDENT_AMBULATORY_CARE_PROVIDER_SITE_OTHER): Payer: Medicare Other | Admitting: Cardiovascular Disease

## 2020-01-11 DIAGNOSIS — R600 Localized edema: Secondary | ICD-10-CM | POA: Diagnosis not present

## 2020-01-11 DIAGNOSIS — I10 Essential (primary) hypertension: Secondary | ICD-10-CM

## 2020-01-11 DIAGNOSIS — F329 Major depressive disorder, single episode, unspecified: Secondary | ICD-10-CM

## 2020-01-11 DIAGNOSIS — F419 Anxiety disorder, unspecified: Secondary | ICD-10-CM | POA: Diagnosis not present

## 2020-01-11 DIAGNOSIS — N184 Chronic kidney disease, stage 4 (severe): Secondary | ICD-10-CM | POA: Diagnosis not present

## 2020-01-11 DIAGNOSIS — F32A Depression, unspecified: Secondary | ICD-10-CM

## 2020-01-11 DIAGNOSIS — E039 Hypothyroidism, unspecified: Secondary | ICD-10-CM | POA: Diagnosis not present

## 2020-01-11 DIAGNOSIS — E782 Mixed hyperlipidemia: Secondary | ICD-10-CM | POA: Diagnosis not present

## 2020-01-11 MED ORDER — FUROSEMIDE 20 MG PO TABS: ORAL_TABLET | ORAL | 3 refills | Status: DC

## 2020-01-11 NOTE — Patient Instructions (Signed)
Medication Instructions:  BEGIN TAKING YOUR LASIX EVERY OTHER DAY *If you need a refill on your cardiac medications before your next appointment, please call your pharmacy*   Follow-Up: At [facility], you and your health needs are our priority.  As part of our continuing mission to provide you with exceptional heart care, we have created designated Provider Care Teams.  These Care Teams include your primary Cardiologist (physician) and Advanced Practice Providers (APPs -  Physician Assistants and Nurse Practitioners) who all work together to provide you with the care you need, when you need it.  We recommend signing up for the patient portal called "MyChart".  Sign up information is provided on this After Visit Summary.  MyChart is used to connect with patients for Virtual Visits (Telemedicine).  Patients are able to view lab/test results, encounter notes, upcoming appointments, etc.  Non-urgent messages can be sent to your provider as well.   To learn more about what you can do with MyChart, go to [email or web address].    Your next appointment:   6 month(s)  The format for your next appointment:   In Person  Provider:   Shelva Majestic, MD

## 2020-01-11 NOTE — Progress Notes (Signed)
Patient ID: Regina Horton, female   DOB: Nov 15, 1950, 69 y.o.   MRN: [id number]      Primary MD:  Dr. Tula Nakayama  HPI: Regina Horton is a 69 y.o. female who presents to the office today for a 6 month follow up cardiology evaluation.  Regina Horton has a history of palpitations which have been controlled with beta blocker therapy, hypertension, mixed hyperlipidemia, obesity, mild renal insufficiency, and chronic low back pain.  An echo Doppler study in 2010  showed mild aortic sclerosis without stenosis, mild mitral and calcification with mild MR, and mild TR, and normal systolic function.  A nuclear perfusion study showed normal perfusion.  She has a significant history of depression and takes Cymbalta, both for depression, as well as for chronic low back pain.  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  She walks with a cane.  Her low back pain has been debilitating.  She is follow-up Dr. Birdie Sons for renal insufficiency. She has had issues with chronic pain in particular back pain.  She has seen Dr. Jeanell Sparrow most for pain management.  She recently stopped taking her pain medications due to concerns at the were adversely affecting her.  She continues to have issues with depression and cries often, because her back hurts.  She also has noticed some palpitations.    When I saw her in January 2018, she  continued to have low back issues and was being evaluated by Dr. Ellene Route.  She is been evaluated by Dr. Ellene Route.She does experience frequent ankle swelling almost daily and occasionally this is more pronounced.  She denies any episodes of chest pain, palpitations, presyncope or syncope.  On 05/19/2017: Creatinine was 1.5.  Hemoglobin 13.6.  Total cholesterol was 150, HDL 57, LDL 57, and triglycerides are mildly increased at 179.  She walks with a walker primarily due to knee discomfort.    When I  saw  her in June 2019 she was feeling well on low-dose hydrochlorothiazide and had improvement in her ankle edema.  Laboratory tended to show stable renal function.  I saw her in December 2019 at which time she continued to feel well.  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  She has experienced rare nonexertional episodes of chest pain which are atypical and not representative of angina.  Her last creatinine in July 2019 was 1.7.  Lipid studies revealed total cholesterol 142, HDL 65, LDL 53, and triglycerides 118.  She was followed by Dr. Lowanda Foster with reference to her renal disease.    I last evaluated her in a telemedicine visit in November 2020.  Dr. Lowanda Foster had retired and she was establishing care with his new partner Dr. Theador Hawthorne.  She sees him every 2 to 3 months.  Since her last evaluation with me she has seen Dr. Renato Shin for endocrinology care as well as Dr. Adam Phenix for primary care, being last seen April 2021.  Presently, she is now on increased levothyroxine dose at 75 mcg.  She denies any chest pain.  There is some mild shortness of breath with activity but she is not very active.  She denies palpitations.  She continues to have chronic back pain and has degenerative disc disease followed by Dr. Ellene Route involving 7 discs.  She notes occasional swelling.  She presents for evaluation.  Past  Medical History:  Diagnosis Date   Allergic rhinitis    Angina at rest [facility])    Anxiety    Aortic sclerosis    Cataract 2016   Chronic back pain    Chronic constipation    CKD (chronic kidney disease)    Depression    Fibromyalgia    GERD (gastroesophageal reflux disease)    Hyperlipidemia    Hypertension    Hypertension    Mitral regurgitation    Osteoporosis    Palpitations    Thyroid disease    Tricuspid regurgitation     Past Surgical History:  Procedure Laterality Date    BACK SURGERY  2005   BREAST LUMPECTOMY  1970   right    CATARACT EXTRACTION W/PHACO Right 06/15/2015   Procedure: CATARACT EXTRACTION PHACO AND INTRAOCULAR LENS PLACEMENT ([place]);  Surgeon: Tonny Branch, MD;  Location: [facility];  Service: Ophthalmology;  Laterality: Right;  CDE: 10.33   CATARACT EXTRACTION W/PHACO Left 07/17/2015   Procedure: CATARACT EXTRACTION PHACO AND INTRAOCULAR LENS PLACEMENT ([facility]);  Surgeon: Tonny Branch, MD;  Location: [facility];  Service: Ophthalmology;  Laterality: Left;  CDE:7.60   NM MYOCAR PERF WALL MOTION  02/16/2009   normal   ORIF ANKLE FRACTURE Left 10/26/2014   Procedure: OPEN REDUCTION INTERNAL FIXATION (ORIF) ANKLE FRACTURE;  Surgeon: Sanjuana Kava, MD;  Location: [facility];  Service: Orthopedics;  Laterality: Left;   TONSILLECTOMY  childhood    TUBAL LIGATION  1973   US ECHOCARDIOGRAPHY  12/19/2008   mild mitral annular ca+,AOV mildly sclerotic,mild MR & TR    Allergies  Allergen Reactions   Levocetirizine Hives and Itching   Xyzal [Levocetirizine Dihydrochloride] Hives and Itching   Codeine     Pt states she does not have any problems with codeine.   Nickel Rash    Current Outpatient Medications  Medication Sig Dispense Refill   atorvastatin (LIPITOR) 40 MG tablet Take 1 tablet (40 mg total) by mouth daily. 90 tablet 2   b complex vitamins tablet Take 1 tablet by mouth daily. 1000 units     Carboxymethylcellul-Glycerin (REFRESH RELIEVA OP) Apply 1 drop to eye. 6 times per day     Cholecalciferol (VITAMIN D) 1000 UNITS capsule Take 2,000 Units by mouth 2 (two) times daily.      cyclobenzaprine (FLEXERIL) 5 MG tablet Take 5 mg by mouth 2 (two) times daily as needed.     diltiazem (CARTIA XT) 240 MG 24 hr capsule Take 1 capsule (240 mg total) by mouth daily. 90 capsule 2   DULoxetine (CYMBALTA) 60 MG capsule Take 1 capsule (60 mg total) by mouth 2 (two) times daily. 180 capsule 1   fenofibrate 160 MG tablet Take 1 tablet (160 mg total) by mouth  daily. 90 tablet 2   fluticasone (FLONASE) 50 MCG/ACT nasal spray Place 2 sprays into both nostrils daily. 48 g 1   furosemide (LASIX) 20 MG tablet Take 1 tablet by mouth every other day. 30 tablet 3   gabapentin (NEURONTIN) 600 MG tablet TAKE 1/2 TABLET EVERY MORNING AND 1 TABLET EVERY EVENING (NEEDS TO BE SEEN BEFORE NEXT REFILL) 135 tablet 0   hydrOXYzine (ATARAX/VISTARIL) 25 MG tablet Take 25-50 mg by mouth at bedtime.     levothyroxine (SYNTHROID) 75 MCG tablet Take 1 tablet (75 mcg total) by mouth daily. 90 tablet 3   meloxicam (MOBIC) 15 MG tablet Take 15 mg by mouth daily as needed.     metoprolol succinate (TOPROL-XL) 100 MG 24  hr tablet Take 1 tablet (100 mg total) by mouth every morning and take 0.5 tablet (50 mg total) by mouth every evening. 135 tablet 2   omega-3 acid ethyl esters (LOVAZA) 1 g capsule Take 2 capsules (2 g total) by mouth 2 (two) times daily. 360 capsule 0   oxyCODONE-acetaminophen (PERCOCET) 7.5-325 MG tablet Take 1 tablet by mouth every 8 (eight) hours as needed.     pantoprazole (PROTONIX) 40 MG tablet Take 1 tablet (40 mg total) by mouth daily. (Needs to be seen before next refill) 90 tablet 0   No current facility-administered medications for this visit.    Social History   Socioeconomic History   Marital status: Married    Spouse name: Not on file   Number of children: 2   Years of education: Not on file   Highest education level: Not on file  Occupational History   Occupation: disabled   Tobacco Use   Smoking status: Former Smoker    Packs/day: 1.00    Years: 20.00    Pack years: 20.00    Types: Cigarettes    Quit date: 12/24/2003    Years since quitting: 16.0   Smokeless tobacco: Never Used  Substance and Sexual Activity   Alcohol use: No   Drug use: Yes    Types: Marijuana    Comment: boils marijuana in water and makes a tea about 3 times a week   Sexual activity: Not Currently    Birth control/protection: Surgical   Other Topics Concern   Not on file  Social History Narrative   Not on file   Social Determinants of Health   Financial Resource Strain:    Difficulty of Paying Living Expenses:   Food Insecurity:    Worried About Charity fundraiser in the Last Year:    Arboriculturist in the Last Year:   Transportation Needs:    Film/video editor (Medical):    Lack of Transportation (Non-Medical):   Physical Activity:    Days of Exercise per Week:    Minutes of Exercise per Session:   Stress:    Feeling of Stress :   Social Connections:    Frequency of Communication with Friends and Family:    Frequency of Social Gatherings with Friends and Family:    Attends Religious Services:    Active Member of Clubs or Organizations:    Attends Music therapist:    Marital Status:   Intimate Partner Violence:    Fear of Current or Ex-Partner:    Emotionally Abused:    Physically Abused:    Sexually Abused:     Family History  Problem Relation Age of Onset   Rheum arthritis Mother    Osteoporosis Mother    Heart disease Father    Diabetes Father    Hyperlipidemia Father    Bladder Cancer Father    Heart disease Brother    Arthritis Brother     ROS General: Negative; No fevers, chills, or night sweats HEENT: Negative; No changes in vision or hearing, sinus congestion, difficulty swallowing Pulmonary: Negative; No cough, wheezing, shortness of breath, hemoptysis Cardiovascular: See HPI: No chest pain, presyncope, syncope, palpitations Positive for occasional ankle swelling GI: Positive for GERD. No nausea, vomiting, diarrhea, or abdominal pain GU: Negative; No dysuria, hematuria, or difficulty voiding Musculoskeletal: Positive for chronic low back pain and knee discomfort Hematologic: Negative; no easy bruising, bleeding Endocrine: Positive for hypothyroidism.  No diabetes Neuro: Negative; no changes  in balance, headaches Skin: Negative; No  rashes or skin lesions Psychiatric: Positive for depression and anxiety Sleep: Negative; No snoring,  daytime sleepiness, hypersomnolence, bruxism, restless legs, hypnogognic hallucinations. Other comprehensive 14 point system review is negative   Physical Exam BP 128/74    Pulse 62    Temp (!) 96.8 F (36 C)    Ht _0  (1.626 m)    Wt 246 lb (111.6 kg)    SpO2 94%    BMI 42.23 kg/m    Repeat blood pressure by me was 146/78  Wt Readings from Last 3 Encounters:  01/11/20 246 lb (111.6 kg)  12/15/19 247 lb (112 kg)  06/30/19 220 lb (99.8 kg)   General: Alert, oriented, no distress.  Skin: normal turgor, no rashes, warm and dry HEENT: Normocephalic, atraumatic. Pupils equal round and reactive to light; sclera anicteric; extraocular muscles intact; Nose without nasal septal hypertrophy Mouth/Parynx benign; Mallinpatti scale 3 Neck: No JVD, no carotid bruits; normal carotid upstroke Lungs: clear to ausculatation and percussion; no wheezing or rales Chest wall: without tenderness to palpitation Heart: PMI not displaced, RRR, s1 s2 normal, 1/6 systolic murmur, no diastolic murmur, no rubs, gallops, thrills, or heaves Abdomen: soft, nontender; no hepatosplenomehaly, BS+; abdominal aorta nontender and not dilated by palpation. Back: no CVA tenderness Pulses 2+ Musculoskeletal: full range of motion, normal strength, no joint deformities Extremities: no clubbing cyanosis or edema, Homan's sign negative  Neurologic: grossly nonfocal; Cranial nerves grossly wnl Psychologic: Normal mood and affect   ECG (independently read by me): NSR with mild sinus arrythmia; normal intervals, NST change; IRBBB  July 28, 2018 ECG (independently read by me): Normal sinus rhythm at 65 bpm.  Incomplete right bundle branch block.  Anterior nonspecific ST-T changes  January 28, 2018 ECG (independently read by me): Sinus rhythm at 68 bpm.  Nonspecific ST-T changes.  Incomplete right bundle branch  block.  July 17, 2017 ECG (independently read by me): Sinus bradycardia 55 bpm.  Nonspecific ST changes.  Normal intervals.  No ectopy.  09/17/2016 ECG (independently read by me): Sinus rhythm with incomplete right bundle branch block.  Nonspecific ST-T changes.  QTc interval 431 Regina, PR interval 156 Regina.  September 14 2015 ECG (independently read by me):  Sinus bradycardia 57 bpm.  Incomplete right bundle branch block.  September 2015ECG (independently read by me): Normal sinus rhythm with incomplete right bundle branch block.  LABS:  BMP Latest Ref Rng & Units 12/15/2019 03/05/2018 01/01/2017  Glucose 65 - 99 mg/dL 113(H) 98 111(H)  BUN 8 - 27 mg/dL 28(H) 28(H) 18  Creatinine 0.57 - 1.00 mg/dL 1.91(H) 1.79(H) 1.32(H)  BUN/Creat Ratio 12 - _1 Sodium 134 - 144 mmol/L 145(H) 144 144  Potassium 3.5 - 5.2 mmol/L 4.4 5.3(H) 4.7  Chloride 96 - 106 mmol/L 106 104 103  CO2 20 - 29 mmol/L _2 Calcium 8.7 - 10.3 mg/dL 10.4(H) 10.2 10.2   Hepatic Function Latest Ref Rng & Units 12/15/2019 03/05/2018 01/01/2017  Total Protein 6.0 - 8.5 g/dL 6.9 6.9 7.0  Albumin 3.8 - 4.8 g/dL 4.5 4.6 4.5  AST 0 - 40 IU/L _3 ALT 0 - 32 IU/L _4 Alk Phosphatase 39 - 117 IU/L 52 47 46  Total Bilirubin 0.0 - 1.2 mg/dL 0.6 0.4 0.5  Bilirubin, Direct 0.0 - 0.3 mg/dL - - -   CBC Latest Ref Rng & Units 12/15/2019 03/05/2018 09/28/2015  WBC 3.4 -  10.8 x10E3/uL 7.0 7.0 8.2  Hemoglobin 11.1 - 15.9 g/dL 13.8 13.2 12.2  Hematocrit 34.0 - 46.6 % 40.2 41.3 36.7  Platelets 150 - 450 x10E3/uL 334 380 417(H)   Lab Results  Component Value Date   MCV 93 12/15/2019   MCV 93 03/05/2018   MCV 92 09/28/2015   Lab Results  Component Value Date   TSH 4.950 (H) 12/15/2019   Lab Results  Component Value Date   HGBA1C 4.8 07/07/2014   Lipid Panel     Component Value Date/Time   CHOL 136 12/15/2019 1423   TRIG 117 12/15/2019 1423   HDL 60 12/15/2019 1423   CHOLHDL 2.3 12/15/2019 1423   CHOLHDL  3.0 05/12/2015 1459   VLDL 30 05/12/2015 1459   LDLCALC 55 12/15/2019 1423    RADIOLOGY: No results found.  IMPRESSION: 1. Essential hypertension   2. Mixed hyperlipidemia   3. Lower extremity edema   4. CKD (chronic kidney disease) stage 4, GFR 15-29 ml/min (HCC)   5. Hypothyroidism, unspecified type   6. Anxiety and depression   7. Morbid obesity ([place])     ASSESSMENT AND PLAN: Regina. Aletta Edmunds is a 69 year-old female who has a history of depression and continues to take Cymbalta 60 mg daily.  In the past she had frequent crying spells but these have improved.  Presently she has been on diltiazem 240 mg daily, metoprolol succinate 100 mg in the morning and 50 mg in the evening for blood pressure control and has been taking furosemide 20 mg every other day for intermittent leg swelling.  She admits that her swelling has increased.  I have recommended she increase her Lasix to every other day and on days that she not take it depending upon edema can take it as needed.  I discussed support stockings.  She is followed now by Dr. Theador Hawthorne for her chronic kidney disease.  Creatinine on December 15, 2019 was 1.91 consistent with early stage IV CKD.  She is now seeing Dr. Theador Hawthorne at 8-monthintervals.  She continues to be on atorvastatin 40 mg, omega-3 fatty acids and fenofibrate for mixed hyperlipidemia.  Target LDL is less than 70.  Recent lab work from December 15, 2019 showed total cholesterol 136 triglycerides 117 HDL 60 and LDL cholesterol 55.  She is morbidly obese with a BMI of 42.23.  Weight loss and increased exercise was recommended.  She continues to have chronic back pain and is significant disc disease followed by Dr. EEllene Route  She continues to be on Cymbalta for anxiety/depression.  I will see her in 6 months for reevaluation.   TTroy Sine MD, FOrthopaedic Surgery [facility] 01/17/2020 10:35 AM

## 2020-01-15 ENCOUNTER — Other Ambulatory Visit: Payer: Self-pay | Admitting: Family Medicine

## 2020-01-15 DIAGNOSIS — M5440 Lumbago with sciatica, unspecified side: Secondary | ICD-10-CM

## 2020-01-15 DIAGNOSIS — G8929 Other chronic pain: Secondary | ICD-10-CM

## 2020-01-15 DIAGNOSIS — K219 Gastro-esophageal reflux disease without esophagitis: Secondary | ICD-10-CM

## 2020-01-17 ENCOUNTER — Encounter: Payer: Self-pay | Admitting: Cardiovascular Disease

## 2020-01-19 ENCOUNTER — Other Ambulatory Visit: Payer: Self-pay | Admitting: Cardiovascular Disease

## 2020-02-22 ENCOUNTER — Other Ambulatory Visit: Payer: Self-pay | Admitting: Family Medicine

## 2020-02-22 DIAGNOSIS — M5440 Lumbago with sciatica, unspecified side: Secondary | ICD-10-CM

## 2020-02-22 DIAGNOSIS — G8929 Other chronic pain: Secondary | ICD-10-CM

## 2020-02-23 NOTE — Telephone Encounter (Signed)
OV 12/15/19 rtc 6 mos

## 2020-03-31 ENCOUNTER — Other Ambulatory Visit: Payer: Self-pay | Admitting: Family Medicine

## 2020-03-31 DIAGNOSIS — K219 Gastro-esophageal reflux disease without esophagitis: Secondary | ICD-10-CM

## 2020-04-05 DIAGNOSIS — N184 Chronic kidney disease, stage 4 (severe): Secondary | ICD-10-CM | POA: Diagnosis not present

## 2020-04-05 DIAGNOSIS — I129 Hypertensive chronic kidney disease with stage 1 through stage 4 chronic kidney disease, or unspecified chronic kidney disease: Secondary | ICD-10-CM | POA: Diagnosis not present

## 2020-04-05 DIAGNOSIS — E6609 Other obesity due to excess calories: Secondary | ICD-10-CM | POA: Diagnosis not present

## 2020-04-05 DIAGNOSIS — E559 Vitamin D deficiency, unspecified: Secondary | ICD-10-CM | POA: Diagnosis not present

## 2020-04-05 DIAGNOSIS — Z79899 Other long term (current) drug therapy: Secondary | ICD-10-CM | POA: Diagnosis not present

## 2020-04-07 ENCOUNTER — Other Ambulatory Visit (HOSPITAL_COMMUNITY): Payer: Self-pay | Admitting: Nephrology

## 2020-04-07 DIAGNOSIS — I129 Hypertensive chronic kidney disease with stage 1 through stage 4 chronic kidney disease, or unspecified chronic kidney disease: Secondary | ICD-10-CM

## 2020-04-07 DIAGNOSIS — N184 Chronic kidney disease, stage 4 (severe): Secondary | ICD-10-CM | POA: Diagnosis not present

## 2020-04-07 DIAGNOSIS — R600 Localized edema: Secondary | ICD-10-CM | POA: Diagnosis not present

## 2020-04-07 DIAGNOSIS — E211 Secondary hyperparathyroidism, not elsewhere classified: Secondary | ICD-10-CM | POA: Diagnosis not present

## 2020-04-09 ENCOUNTER — Other Ambulatory Visit: Payer: Self-pay | Admitting: Cardiovascular Disease

## 2020-04-12 ENCOUNTER — Other Ambulatory Visit: Payer: Self-pay

## 2020-04-12 ENCOUNTER — Ambulatory Visit (HOSPITAL_COMMUNITY)
Admission: RE | Admit: 2020-04-12 | Discharge: 2020-04-12 | Disposition: A | Discharge status: Home / Self Care | Payer: Medicare Other | Source: Ambulatory Visit | Attending: Family Medicine | Admitting: Family Medicine

## 2020-04-12 DIAGNOSIS — I129 Hypertensive chronic kidney disease with stage 1 through stage 4 chronic kidney disease, or unspecified chronic kidney disease: Secondary | ICD-10-CM | POA: Diagnosis not present

## 2020-04-12 LAB — ECHOCARDIOGRAM COMPLETE
AR max vel: 2.7 cm2
AV Area VTI: 2.58 cm2
AV Area mean vel: 2.53 cm2
AV Mean grad: 5.3 mmHg
AV Peak grad: 10.2 mmHg
Ao pk vel: 1.6 m/s
Area-P 1/2: 3.2 cm2
S' Lateral: 2.81 cm

## 2020-04-12 NOTE — Progress Notes (Signed)
*  PRELIMINARY RESULTS* Echocardiogram 2D Echocardiogram has been performed.  Regina Horton 04/12/2020, 2:54 PM

## 2020-05-01 DIAGNOSIS — M4725 Other spondylosis with radiculopathy, thoracolumbar region: Secondary | ICD-10-CM | POA: Diagnosis not present

## 2020-05-01 DIAGNOSIS — Z79891 Long term (current) use of opiate analgesic: Secondary | ICD-10-CM | POA: Diagnosis not present

## 2020-05-01 DIAGNOSIS — M130 Polyarthritis, unspecified: Secondary | ICD-10-CM | POA: Diagnosis not present

## 2020-05-01 DIAGNOSIS — G4700 Insomnia, unspecified: Secondary | ICD-10-CM | POA: Diagnosis not present

## 2020-05-01 DIAGNOSIS — M545 Low back pain: Secondary | ICD-10-CM | POA: Diagnosis not present

## 2020-05-01 DIAGNOSIS — M961 Postlaminectomy syndrome, not elsewhere classified: Secondary | ICD-10-CM | POA: Diagnosis not present

## 2020-05-01 DIAGNOSIS — G894 Chronic pain syndrome: Secondary | ICD-10-CM | POA: Diagnosis not present

## 2020-05-03 DIAGNOSIS — N184 Chronic kidney disease, stage 4 (severe): Secondary | ICD-10-CM | POA: Diagnosis not present

## 2020-05-03 DIAGNOSIS — R600 Localized edema: Secondary | ICD-10-CM | POA: Diagnosis not present

## 2020-05-03 DIAGNOSIS — I129 Hypertensive chronic kidney disease with stage 1 through stage 4 chronic kidney disease, or unspecified chronic kidney disease: Secondary | ICD-10-CM | POA: Diagnosis not present

## 2020-05-03 DIAGNOSIS — E211 Secondary hyperparathyroidism, not elsewhere classified: Secondary | ICD-10-CM | POA: Diagnosis not present

## 2020-05-05 ENCOUNTER — Other Ambulatory Visit: Payer: Self-pay | Admitting: Cardiovascular Disease

## 2020-05-05 ENCOUNTER — Other Ambulatory Visit: Payer: Self-pay | Admitting: Family Medicine

## 2020-05-05 DIAGNOSIS — R001 Bradycardia, unspecified: Secondary | ICD-10-CM | POA: Diagnosis not present

## 2020-05-05 DIAGNOSIS — E870 Hyperosmolality and hypernatremia: Secondary | ICD-10-CM | POA: Diagnosis not present

## 2020-05-05 DIAGNOSIS — I129 Hypertensive chronic kidney disease with stage 1 through stage 4 chronic kidney disease, or unspecified chronic kidney disease: Secondary | ICD-10-CM | POA: Diagnosis not present

## 2020-05-05 DIAGNOSIS — I5032 Chronic diastolic (congestive) heart failure: Secondary | ICD-10-CM | POA: Diagnosis not present

## 2020-05-05 DIAGNOSIS — E211 Secondary hyperparathyroidism, not elsewhere classified: Secondary | ICD-10-CM | POA: Diagnosis not present

## 2020-05-05 DIAGNOSIS — N184 Chronic kidney disease, stage 4 (severe): Secondary | ICD-10-CM | POA: Diagnosis not present

## 2020-05-05 DIAGNOSIS — G8929 Other chronic pain: Secondary | ICD-10-CM

## 2020-05-05 NOTE — Telephone Encounter (Signed)
[name]. NTBS in October for 6 mos. Mail order sent

## 2020-05-08 ENCOUNTER — Telehealth: Payer: Self-pay | Admitting: *Deleted

## 2020-05-08 DIAGNOSIS — M5440 Lumbago with sciatica, unspecified side: Secondary | ICD-10-CM

## 2020-05-08 DIAGNOSIS — G8929 Other chronic pain: Secondary | ICD-10-CM

## 2020-05-08 MED ORDER — DULOXETINE HCL 60 MG PO CPEP: ORAL_CAPSULE | ORAL | 0 refills | Status: DC

## 2020-05-08 NOTE — Telephone Encounter (Signed)
[name]. NTBS in October for 6 mos. Mail order sent

## 2020-06-15 ENCOUNTER — Other Ambulatory Visit: Payer: Self-pay | Admitting: Family Medicine

## 2020-06-15 DIAGNOSIS — M5440 Lumbago with sciatica, unspecified side: Secondary | ICD-10-CM

## 2020-06-15 DIAGNOSIS — G8929 Other chronic pain: Secondary | ICD-10-CM

## 2020-06-15 DIAGNOSIS — K219 Gastro-esophageal reflux disease without esophagitis: Secondary | ICD-10-CM

## 2020-06-15 NOTE — Telephone Encounter (Signed)
[name]. NTBS for 6 mos ckup. Mail order not sent

## 2020-06-16 NOTE — Telephone Encounter (Signed)
Aware and verbalizes understanding.  

## 2020-06-16 NOTE — Telephone Encounter (Signed)
Left message for patient to call back to schedule an appointment for medication refills. 

## 2020-06-16 NOTE — Telephone Encounter (Signed)
Patient last seen 12/15/19. Patient needs an appointment

## 2020-06-22 ENCOUNTER — Other Ambulatory Visit: Payer: Self-pay

## 2020-06-22 DIAGNOSIS — M5440 Lumbago with sciatica, unspecified side: Secondary | ICD-10-CM

## 2020-06-22 DIAGNOSIS — Z23 Encounter for immunization: Secondary | ICD-10-CM | POA: Diagnosis not present

## 2020-06-22 DIAGNOSIS — G8929 Other chronic pain: Secondary | ICD-10-CM

## 2020-06-22 MED ORDER — GABAPENTIN 600 MG PO TABS: ORAL_TABLET | ORAL | 0 refills | Status: DC

## 2020-06-30 DIAGNOSIS — I129 Hypertensive chronic kidney disease with stage 1 through stage 4 chronic kidney disease, or unspecified chronic kidney disease: Secondary | ICD-10-CM | POA: Diagnosis not present

## 2020-06-30 DIAGNOSIS — N184 Chronic kidney disease, stage 4 (severe): Secondary | ICD-10-CM | POA: Diagnosis not present

## 2020-06-30 DIAGNOSIS — E211 Secondary hyperparathyroidism, not elsewhere classified: Secondary | ICD-10-CM | POA: Diagnosis not present

## 2020-06-30 DIAGNOSIS — E870 Hyperosmolality and hypernatremia: Secondary | ICD-10-CM | POA: Diagnosis not present

## 2020-06-30 DIAGNOSIS — I5032 Chronic diastolic (congestive) heart failure: Secondary | ICD-10-CM | POA: Diagnosis not present

## 2020-07-02 ENCOUNTER — Encounter: Payer: Self-pay | Admitting: Family Medicine

## 2020-07-04 DIAGNOSIS — H524 Presbyopia: Secondary | ICD-10-CM | POA: Diagnosis not present

## 2020-07-04 DIAGNOSIS — H40013 Open angle with borderline findings, low risk, bilateral: Secondary | ICD-10-CM | POA: Diagnosis not present

## 2020-07-04 DIAGNOSIS — Z961 Presence of intraocular lens: Secondary | ICD-10-CM | POA: Diagnosis not present

## 2020-07-04 DIAGNOSIS — H16223 Keratoconjunctivitis sicca, not specified as Sjogren's, bilateral: Secondary | ICD-10-CM | POA: Diagnosis not present

## 2020-07-06 DIAGNOSIS — E211 Secondary hyperparathyroidism, not elsewhere classified: Secondary | ICD-10-CM | POA: Diagnosis not present

## 2020-07-06 DIAGNOSIS — N184 Chronic kidney disease, stage 4 (severe): Secondary | ICD-10-CM | POA: Diagnosis not present

## 2020-07-06 DIAGNOSIS — I5032 Chronic diastolic (congestive) heart failure: Secondary | ICD-10-CM | POA: Diagnosis not present

## 2020-07-06 DIAGNOSIS — I129 Hypertensive chronic kidney disease with stage 1 through stage 4 chronic kidney disease, or unspecified chronic kidney disease: Secondary | ICD-10-CM | POA: Diagnosis not present

## 2020-07-06 DIAGNOSIS — E870 Hyperosmolality and hypernatremia: Secondary | ICD-10-CM | POA: Diagnosis not present

## 2020-07-19 ENCOUNTER — Other Ambulatory Visit: Payer: Self-pay

## 2020-07-19 ENCOUNTER — Ambulatory Visit (INDEPENDENT_AMBULATORY_CARE_PROVIDER_SITE_OTHER): Payer: Medicare Other | Admitting: *Deleted

## 2020-07-19 DIAGNOSIS — M810 Age-related osteoporosis without current pathological fracture: Secondary | ICD-10-CM

## 2020-07-19 MED ORDER — DENOSUMAB 60 MG/ML ~~LOC~~ SOSY
60.0000 mg | PREFILLED_SYRINGE | Freq: Once | SUBCUTANEOUS | Status: AC
  Administered 2020-07-19: 60 mg via SUBCUTANEOUS

## 2020-07-19 NOTE — Progress Notes (Signed)
Pt given Prolia injection SubQ right upper arm and tolerated well.  [employer] and [employer]

## 2020-07-24 DIAGNOSIS — M545 Low back pain, unspecified: Secondary | ICD-10-CM | POA: Diagnosis not present

## 2020-07-24 DIAGNOSIS — Z79891 Long term (current) use of opiate analgesic: Secondary | ICD-10-CM | POA: Diagnosis not present

## 2020-07-24 DIAGNOSIS — M5459 Other low back pain: Secondary | ICD-10-CM | POA: Diagnosis not present

## 2020-07-24 DIAGNOSIS — M961 Postlaminectomy syndrome, not elsewhere classified: Secondary | ICD-10-CM | POA: Diagnosis not present

## 2020-07-24 DIAGNOSIS — M4725 Other spondylosis with radiculopathy, thoracolumbar region: Secondary | ICD-10-CM | POA: Diagnosis not present

## 2020-07-24 DIAGNOSIS — G894 Chronic pain syndrome: Secondary | ICD-10-CM | POA: Diagnosis not present

## 2020-07-24 DIAGNOSIS — G4700 Insomnia, unspecified: Secondary | ICD-10-CM | POA: Diagnosis not present

## 2020-07-24 DIAGNOSIS — M130 Polyarthritis, unspecified: Secondary | ICD-10-CM | POA: Diagnosis not present

## 2020-08-01 ENCOUNTER — Ambulatory Visit (INDEPENDENT_AMBULATORY_CARE_PROVIDER_SITE_OTHER): Payer: Medicare Other | Admitting: Family Medicine

## 2020-08-01 DIAGNOSIS — R600 Localized edema: Secondary | ICD-10-CM

## 2020-08-01 DIAGNOSIS — G8929 Other chronic pain: Secondary | ICD-10-CM

## 2020-08-01 DIAGNOSIS — K219 Gastro-esophageal reflux disease without esophagitis: Secondary | ICD-10-CM | POA: Diagnosis not present

## 2020-08-01 DIAGNOSIS — M5440 Lumbago with sciatica, unspecified side: Secondary | ICD-10-CM

## 2020-08-01 DIAGNOSIS — N184 Chronic kidney disease, stage 4 (severe): Secondary | ICD-10-CM | POA: Diagnosis not present

## 2020-08-01 DIAGNOSIS — M5441 Lumbago with sciatica, right side: Secondary | ICD-10-CM

## 2020-08-01 MED ORDER — GABAPENTIN 600 MG PO TABS: ORAL_TABLET | ORAL | 3 refills | Status: DC

## 2020-08-01 MED ORDER — DULOXETINE HCL 60 MG PO CPEP: ORAL_CAPSULE | ORAL | 3 refills | Status: DC

## 2020-08-01 MED ORDER — PANTOPRAZOLE SODIUM 40 MG PO TBEC: 40.0000 mg | DELAYED_RELEASE_TABLET | Freq: Every day | ORAL | 3 refills | Status: DC

## 2020-08-01 MED ORDER — FUROSEMIDE 20 MG PO TABS: 20.0000 mg | ORAL_TABLET | Freq: Every day | ORAL | 1 refills | Status: DC

## 2020-08-01 NOTE — Progress Notes (Signed)
Telephone visit  Subjective: CC: GERD, refills PCP: [name], DO Regina Horton is a 69 y.o. female calls for telephone consult today. Patient provides verbal consent for consult held via phone.  Due to COVID-19 pandemic this visit was conducted virtually. This visit type was conducted due to national recommendations for restrictions regarding the COVID-19 Pandemic (e.g. social distancing, sheltering in place) in an effort to limit this patient's exposure and mitigate transmission in our community. All issues noted in this document were discussed and addressed.  A physical exam was not performed with this format.   Location of patient: home Location of provider: [facility] Others present for call: spouse  1.  GERD Patient reports stability of GERD with Protonix daily.  She sometimes has breakthrough but this is dependent upon what medicine she is taking that day  2.  Chronic back pain Patient reports that back pain has been stable despite reduction in gabapentin dose.  She has been taking 1/2 tablet each morning and 1 full tablet each evening.  She has been having some exacerbation of her knee pain but otherwise seems to be doing fairly well.  She needs a refill on Cymbalta as well  3.  Renal disease Patient continues to follow-up with her nephrologist intervally.  Medications have been adjusted for her kidney disease.  She is taking Prolia with last dose 2 weeks ago.  She has follow-up with her cardiologist soon   ROS: Per HPI  Allergies  Allergen Reactions  . Levocetirizine Hives and Itching  . Xyzal [Levocetirizine Dihydrochloride] Hives and Itching  . Codeine     Pt states she does not have any problems with codeine.  . Nickel Rash   Past Medical History:  Diagnosis Date  . Allergic rhinitis   . Angina at rest [facility])   . Anxiety   . Aortic sclerosis   . Cataract 2016  . Chronic back pain   . Chronic constipation   . CKD (chronic kidney disease)   . Depression    . Fibromyalgia   . GERD (gastroesophageal reflux disease)   . Hyperlipidemia   . Hypertension   . Hypertension   . Mitral regurgitation   . Osteoporosis   . Palpitations   . Thyroid disease   . Tricuspid regurgitation     Current Outpatient Medications:  .  denosumab (PROLIA) 60 MG/ML SOSY injection, Inject 60 mg into the skin every 6 (six) months., Disp: , Rfl:  .  atorvastatin (LIPITOR) 40 MG tablet, TAKE 1 TABLET (40 MG TOTAL) BY MOUTH DAILY., Disp: 90 tablet, Rfl: 2 .  Carboxymethylcellul-Glycerin (REFRESH RELIEVA OP), Apply 1 drop to eye. 6 times per day, Disp: , Rfl:  .  diltiazem (CARDIZEM CD) 120 MG 24 hr capsule, Take 120 mg by mouth daily., Disp: , Rfl:  .  DULoxetine (CYMBALTA) 60 MG capsule, TAKE 1 CAPSULE TWICE DAILY, Disp: 180 capsule, Rfl: 3 .  fenofibrate 160 MG tablet, TAKE 1 TABLET EVERY DAY, Disp: 90 tablet, Rfl: 2 .  fluticasone (FLONASE) 50 MCG/ACT nasal spray, Place 2 sprays into both nostrils daily., Disp: 48 g, Rfl: 1 .  furosemide (LASIX) 20 MG tablet, Take 1-2 tablets (20-40 mg total) by mouth daily., Disp: 90 tablet, Rfl: 1 .  gabapentin (NEURONTIN) 600 MG tablet, Take 1/2 tab every morning and 1 tablet every evening, Disp: 135 tablet, Rfl: 3 .  hydrOXYzine (ATARAX/VISTARIL) 25 MG tablet, Take 25-50 mg by mouth at bedtime., Disp: , Rfl:  .  levothyroxine (SYNTHROID)  75 MCG tablet, Take 1 tablet (75 mcg total) by mouth daily., Disp: 90 tablet, Rfl: 3 .  methocarbamol (ROBAXIN) 500 MG tablet, Take 500 mg by mouth 2 (two) times daily as needed., Disp: , Rfl:  .  metoprolol succinate (TOPROL-XL) 100 MG 24 hr tablet, Take 1 tablet (100 mg total) by mouth every morning and take 0.5 tablet (50 mg total) by mouth every evening., Disp: 135 tablet, Rfl: 2 .  omega-3 acid ethyl esters (LOVAZA) 1 g capsule, TAKE 2 CAPSULES TWICE DAILY, Disp: 360 capsule, Rfl: 1 .  oxyCODONE-acetaminophen (PERCOCET) 7.5-325 MG tablet, Take 1 tablet by mouth every 8 (eight) hours as needed.,  Disp: , Rfl:  .  pantoprazole (PROTONIX) 40 MG tablet, Take 1 tablet (40 mg total) by mouth daily., Disp: 90 tablet, Rfl: 3  Assessment/ Plan: 69 y.o. female   Gastroesophageal reflux disease without esophagitis - Plan: pantoprazole (PROTONIX) 40 MG tablet  Chronic bilateral low back pain with sciatica, sciatica laterality unspecified - Plan: DULoxetine (CYMBALTA) 60 MG capsule, gabapentin (NEURONTIN) 600 MG tablet  Stage 4 chronic kidney disease (HCC)  Bilateral lower extremity edema  GERD is under fairly good control.  Continue PPI  Back pain is stable with gabapentin, despite reduction in dose/renal dosing.  Continue Cymbalta twice daily.  She continues to follow-up with renal for stage IV chronic kidney disease  Edema is controlled with Lasix 40 mg daily.  Renal function being monitored by nephrology.  She will follow-up with me sometime in April or May for fasting labs and full physical exam  Start time: 12:24pm End time: 12:31pm  Total time spent on patient care (including telephone call/ virtual visit): 7 minutes  [place], [place] [phone]

## 2020-08-15 ENCOUNTER — Other Ambulatory Visit: Payer: Self-pay | Admitting: Cardiovascular Disease

## 2020-08-24 ENCOUNTER — Ambulatory Visit (INDEPENDENT_AMBULATORY_CARE_PROVIDER_SITE_OTHER): Payer: Medicare Other | Admitting: *Deleted

## 2020-08-24 DIAGNOSIS — Z0000 Encounter for general adult medical examination without abnormal findings: Secondary | ICD-10-CM

## 2020-08-24 NOTE — Progress Notes (Signed)
MEDICARE ANNUAL WELLNESS VISIT  08/24/2020  Telephone Visit Disclaimer This Medicare AWV was conducted by telephone due to national recommendations for restrictions regarding the COVID-19 Pandemic (e.g. social distancing).  I verified, using two identifiers, that I am speaking with Regina Horton or their authorized healthcare agent. I discussed the limitations, risks, security, and privacy concerns of performing an evaluation and management service by telephone and the potential availability of an in-person appointment in the future. The patient expressed understanding and agreed to proceed.  Location of Patient: home Location of Provider (nurse):  office  Subjective:    Regina Horton is a 70 y.o. female patient of [name], DO who had a Medicare Annual Wellness Visit today via telephone. Amoree is Retired and lives with their spouse. she has 2 children. she reports that she is socially active and does interact with friends/family regularly. she is minimally physically active and enjoys knitting, crocheting, sewing and is in the process of setting up a craft room in her home.  Patient Care Team: [name], DO as PCP - General (Family Medicine) Rogene Houston, MD as Consulting Physician (Gastroenterology) Jonnie Kind, MD (Inactive) as Consulting Physician (Obstetrics and Gynecology) Renato Shin, MD as Consulting Physician (Endocrinology) Troy Sine, MD as Consulting Physician (Cardiology) Fran Lowes, MD (Inactive) as Consulting Physician (Nephrology) Netta Cedars, MD as Consulting Physician (Orthopedic Surgery)  Advanced Directives 08/24/2020 08/07/2017 01/10/2017 12/23/2016 07/17/2015 06/09/2015 10/26/2014  Does Patient Have a Medical Advance Directive? No No No No No Yes No  Does patient want to make changes to medical advance directive? - - - - - No - Patient declined -  Copy of [place] in Chart? - - - - - No - copy  requested -  Would patient like information on creating a medical advance directive? No - Patient declined Yes (MAU/Ambulatory/Procedural Areas - Information given) - Yes (MAU/Ambulatory/Procedural Areas - Information given) Yes - Educational materials given - No - patient declined information    Hospital Utilization Over the Past 12 Months: # of hospitalizations or ER visits: 0 # of surgeries: 0  Review of Systems    Patient reports that her overall health is unchanged compared to last year.  History obtained from the patient  Patient Reported Readings (BP, Pulse, CBG, Weight, etc) none  Pain Assessment Pain : 0-10 Pain Score: 5  Pain Type: Chronic pain Pain Location: Back Pain Relieving Factors: rest and pain medication Effect of Pain on Daily Activities: has to rest frequently  Pain Relieving Factors: rest and pain medication  Current Medications & Allergies (verified) Allergies as of 08/24/2020      Reactions   Levocetirizine Hives, Itching   Xyzal [levocetirizine Dihydrochloride] Hives, Itching   Codeine    Pt states she does not have any problems with codeine.   Nickel Rash      Medication List       Accurate as of August 24, 2020  2:42 PM. If you have any questions, ask your nurse or doctor.        atorvastatin 40 MG tablet Commonly known as: LIPITOR TAKE 1 TABLET (40 MG TOTAL) BY MOUTH DAILY.   denosumab 60 MG/ML Sosy injection Commonly known as: PROLIA Inject 60 mg into the skin every 6 (six) months.   diltiazem 120 MG 24 hr capsule Commonly known as: CARDIZEM CD Take 120 mg by mouth daily.   DULoxetine 60 MG capsule Commonly known as: CYMBALTA TAKE  1 CAPSULE TWICE DAILY   fenofibrate 160 MG tablet TAKE 1 TABLET EVERY DAY   fluticasone 50 MCG/ACT nasal spray Commonly known as: FLONASE Place 2 sprays into both nostrils daily.   furosemide 20 MG tablet Commonly known as: LASIX Take 1-2 tablets (20-40 mg total) by mouth daily.   gabapentin 600  MG tablet Commonly known as: NEURONTIN Take 1/2 tab every morning and 1 tablet every evening   hydrOXYzine 25 MG tablet Commonly known as: ATARAX/VISTARIL Take 25-50 mg by mouth at bedtime.   levothyroxine 75 MCG tablet Commonly known as: SYNTHROID Take 1 tablet (75 mcg total) by mouth daily.   methocarbamol 500 MG tablet Commonly known as: ROBAXIN Take 500 mg by mouth 2 (two) times daily as needed.   metoprolol succinate 100 MG 24 hr tablet Commonly known as: TOPROL-XL TAKE 1 TABLET EVERY MORNING AND TAKE 1/2 TABLET EVERY EVENING.   omega-3 acid ethyl esters 1 g capsule Commonly known as: LOVAZA TAKE 2 CAPSULES TWICE DAILY   oxyCODONE-acetaminophen 7.5-325 MG tablet Commonly known as: PERCOCET Take 1 tablet by mouth every 8 (eight) hours as needed.   pantoprazole 40 MG tablet Commonly known as: PROTONIX Take 1 tablet (40 mg total) by mouth daily.   REFRESH RELIEVA OP Apply 1 drop to eye. 6 times per day   STOOL SOFTENER PO Take by mouth.       History (reviewed): Past Medical History:  Diagnosis Date  . Allergic rhinitis   . Angina at rest [facility])   . Anxiety   . Aortic sclerosis   . Cataract 2016  . Chronic back pain   . Chronic constipation   . CKD (chronic kidney disease)   . Depression   . Fibromyalgia   . GERD (gastroesophageal reflux disease)   . Hyperlipidemia   . Hypertension   . Hypertension   . Mitral regurgitation   . Osteoporosis   . Palpitations   . Thyroid disease   . Tricuspid regurgitation    Past Surgical History:  Procedure Laterality Date  . BACK SURGERY  2005  . BREAST LUMPECTOMY  1970   right   . CATARACT EXTRACTION W/PHACO Right 06/15/2015   Procedure: CATARACT EXTRACTION PHACO AND INTRAOCULAR LENS PLACEMENT (IOC);  Surgeon: Tonny Branch, MD;  Location: [facility];  Service: Ophthalmology;  Laterality: Right;  CDE: 10.33  . CATARACT EXTRACTION W/PHACO Left 07/17/2015   Procedure: CATARACT EXTRACTION PHACO AND INTRAOCULAR LENS  PLACEMENT (IOC);  Surgeon: Tonny Branch, MD;  Location: [facility];  Service: Ophthalmology;  Laterality: Left;  CDE:7.60  . NM MYOCAR PERF WALL MOTION  02/16/2009   normal  . ORIF ANKLE FRACTURE Left 10/26/2014   Procedure: OPEN REDUCTION INTERNAL FIXATION (ORIF) ANKLE FRACTURE;  Surgeon: Sanjuana Kava, MD;  Location: [facility];  Service: Orthopedics;  Laterality: Left;  . TONSILLECTOMY  childhood   . TUBAL LIGATION  1973  . US ECHOCARDIOGRAPHY  12/19/2008   mild mitral annular ca+,AOV mildly sclerotic,mild MR & TR   Family History  Problem Relation Age of Onset  . Rheum arthritis Mother   . Osteoporosis Mother   . Heart disease Father   . Diabetes Father   . Hyperlipidemia Father   . Bladder Cancer Father   . Heart disease Brother   . Arthritis Brother    Social History   Socioeconomic History  . Marital status: Married    Spouse name: Minerva Fester  . Number of children: 2  . Years of education: 74  . Highest education  level: High school graduate  Occupational History  . Occupation: disabled   Tobacco Use  . Smoking status: Former Smoker    Packs/day: 1.00    Years: 20.00    Pack years: 20.00    Types: Cigarettes    Quit date: 12/24/2003    Years since quitting: 16.6  . Smokeless tobacco: Never Used  Vaping Use  . Vaping Use: Never used  Substance and Sexual Activity  . Alcohol use: No  . Drug use: Not Currently    Types: Marijuana    Comment: boils marijuana in water and makes a tea about 3 times a week  . Sexual activity: Not Currently    Birth control/protection: Surgical  Other Topics Concern  . Not on file  Social History Narrative  . Not on file   Social Determinants of Health   Financial Resource Strain: Low Risk   . Difficulty of Paying Living Expenses: Not hard at all  Food Insecurity: No Food Insecurity  . Worried About Charity fundraiser in the Last Year: Never true  . Ran Out of Food in the Last Year: Never true  Transportation Needs: No Transportation Needs  .  Lack of Transportation (Medical): No  . Lack of Transportation (Non-Medical): No  Physical Activity: Inactive  . Days of Exercise per Week: 0 days  . Minutes of Exercise per Session: 0 min  Stress: No Stress Concern Present  . Feeling of Stress : Not at all  Social Connections: Moderately Integrated  . Frequency of Communication with Friends and Family: More than three times a week  . Frequency of Social Gatherings with Friends and Family: More than three times a week  . Attends Religious Services: 1 to 4 times per year  . Active Member of Clubs or Organizations: No  . Attends Archivist Meetings: Never  . Marital Status: Married    Activities of Daily Living In your present state of health, do you have any difficulty performing the following activities: 08/24/2020  Hearing? N  Vision? N  Difficulty concentrating or making decisions? N  Walking or climbing stairs? Y  Comment due to her knee and back pain  Dressing or bathing? N  Doing errands, shopping? Y  Comment her son or her husband drives her everywhere and does the grocery shopping  Preparing Food and eating ? Y  Comment her son does all the cooking  Using the Toilet? N  In the past six months, have you accidently leaked urine? Y  Comment wears a pad at all times  Do you have problems with loss of bowel control? N  Managing your Medications? N  Managing your Finances? N  Housekeeping or managing your Housekeeping? Y  Comment her husband and son take care of the housekeeping  Some recent data might be hidden    Patient Education/ Literacy How often do you need to have someone help you when you read instructions, pamphlets, or other written materials from your doctor or pharmacy?: 1 - Never What is the last grade level you completed in school?: 12th grade  Exercise Current Exercise Habits: The patient does not participate in regular exercise at present, Exercise limited by: orthopedic  condition(s)  Diet Patient reports consuming 2 meals a day and 2 snack(s) a day Patient reports that her primary diet is: Regular Patient reports that she does have regular access to food.   Depression Screen PHQ 2/9 Scores 08/24/2020 12/15/2019 11/18/2017 08/15/2017 05/05/2017 01/10/2017 01/01/2017  PHQ - 2  Score 0 0 2 1 0 4 4  PHQ- 9 Score - 0 5 - - 19 20     Fall Risk Fall Risk  08/24/2020 12/15/2019 11/18/2017 05/05/2017 12/23/2016  Falls in the past year? 1 1 Yes Yes No  Number falls in past yr: 1 1 2  or more 2 or more -  Injury with Fall? 0 0 Yes No -  Risk Factor Category  - - - - -  Risk for fall due to : History of fall(s) History of fall(s);Impaired mobility - - -  Follow up Falls evaluation completed - - - -     Objective:  Regina Horton seemed alert and oriented and she participated appropriately during our telephone visit.  Blood Pressure Weight BMI  BP Readings from Last 3 Encounters:  01/11/20 128/74  12/15/19 (!) 133/58  06/30/19 117/65   Wt Readings from Last 3 Encounters:  01/11/20 246 lb (111.6 kg)  12/15/19 247 lb (112 kg)  06/30/19 220 lb (99.8 kg)   BMI Readings from Last 1 Encounters:  01/11/20 42.23 kg/m    *Unable to obtain current vital signs, weight, and BMI due to telephone visit type  Hearing/Vision  . Desarai did not seem to have difficulty with hearing/understanding during the telephone conversation . Reports that she has had a formal eye exam by an eye care professional within the past year . Reports that she has not had a formal hearing evaluation within the past year *Unable to fully assess hearing and vision during telephone visit type  Cognitive Function: 6CIT Screen 08/24/2020 12/23/2016  What Year? 0 points 0 points  What month? 0 points 0 points  What time? 0 points 0 points  Count back from 20 0 points 0 points  Months in reverse 0 points 0 points  Repeat phrase 2 points 0 points  Total Score 2 0   (Normal:0-7, Significant for Dysfunction:  >8)  Normal Cognitive Function Screening: Yes   Immunization & Health Maintenance Record Immunization History  Administered Date(s) Administered  . Influenza Whole 05/02/2010  . Influenza, High Dose Seasonal PF 07/17/2017, 08/15/2017, 06/22/2018  . Influenza,inj,Quad PF,6+ Mos 07/07/2014, 05/16/2015, 06/12/2016  . Influenza-Unspecified 07/18/2017, 07/01/2018, 06/23/2020  . Moderna Sars-Covid-2 Vaccination 10/01/2019, 10/29/2019  . Pneumococcal Conjugate-13 01/09/2015  . Pneumococcal Polysaccharide-23 08/19/1998, 02/07/2011, 06/12/2016  . Td 08/07/2010  . Zoster 02/15/2011    Health Maintenance  Topic Date Due  . COLONOSCOPY (Pts 45-69yrs Insurance coverage will need to be confirmed)  09/19/2013  . COVID-19 Vaccine (3 - Booster for Moderna series) 04/30/2020  . TETANUS/TDAP  08/07/2020  . MAMMOGRAM  12/28/2021  . INFLUENZA VACCINE  Completed  . DEXA SCAN  Completed  . Hepatitis C Screening  Completed  . PNA vac Low Risk Adult  Completed       Assessment  This is a routine wellness examination for AKIAH BAUCH.  Health Maintenance: Due or Overdue Health Maintenance Due  Topic Date Due  . COLONOSCOPY (Pts 45-75yrs Insurance coverage will need to be confirmed)  09/19/2013  . COVID-19 Vaccine (3 - Booster for Moderna series) 04/30/2020  . TETANUS/TDAP  08/07/2020    Regina Horton does not need a referral for Community Assistance: Care Management:   no Social Work:    no Prescription Assistance:  no Nutrition/Diabetes Education:  no   Plan:  Personalized Goals Goals Addressed              This Visit's Progress   .  Prevent  falls (pt-stated)        "I would like to walk more to strengthen my legs so I don't fall"      Personalized Health Maintenance & Screening Recommendations  Td vaccine Colorectal cancer screening Shingrix vaccine  Lung Cancer Screening Recommended: no (Low Dose CT Chest recommended if Age 48-80 years, 30 pack-year currently  smoking OR have quit w/in past 15 years) Hepatitis C Screening recommended: no HIV Screening recommended: no  Advanced Directives: Written information was not prepared per patient's request.  Referrals & Orders No orders of the defined types were placed in this encounter.   Follow-up Plan . Follow-up with [name], DO as planned . Bring in your COVID Booster vaccine card for our records . Consider TDAP and Shingrix vaccines   I have personally reviewed and noted the following in the patient's chart:   . Medical and social history . Use of alcohol, tobacco or illicit drugs  . Current medications and supplements . Functional ability and status . Nutritional status . Physical activity . Advanced directives . List of other physicians . Hospitalizations, surgeries, and ER visits in previous 12 months . Vitals . Screenings to include cognitive, depression, and falls . Referrals and appointments  In addition, I have reviewed and discussed with Regina Horton certain preventive protocols, quality metrics, and best practice recommendations. A written personalized care plan for preventive services as well as general preventive health recommendations is available and can be mailed to the patient at her request.      Milas Hock, LPN  11/24/927

## 2020-08-24 NOTE — Patient Instructions (Signed)
Preventive Care 72 Years and Older, Female Preventive care refers to lifestyle choices and visits with your health care provider that can promote health and wellness. This includes:  A yearly physical exam. This is also called an annual well check.  Regular dental and eye exams.  Immunizations.  Screening for certain conditions.  Healthy lifestyle choices, such as diet and exercise. What can I expect for my preventive care visit? Physical exam Your health care provider will check:  Height and weight. These may be used to calculate body mass index (BMI), which is a measurement that tells if you are at a healthy weight.  Heart rate and blood pressure.  Your skin for abnormal spots. Counseling Your health care provider may ask you questions about:  Alcohol, tobacco, and drug use.  Emotional well-being.  Home and relationship well-being.  Sexual activity.  Eating habits.  History of falls.  Memory and ability to understand (cognition).  Work and work Statistician.  Pregnancy and menstrual history. What immunizations do I need?  Influenza (flu) vaccine  This is recommended every year. Tetanus, diphtheria, and pertussis (Tdap) vaccine  You may need a Td booster every 10 years. Varicella (chickenpox) vaccine  You may need this vaccine if you have not already been vaccinated. Zoster (shingles) vaccine  You may need this after age 72. Pneumococcal conjugate (PCV13) vaccine  One dose is recommended after age 72. Pneumococcal polysaccharide (PPSV23) vaccine  One dose is recommended after age 72. Measles, mumps, and rubella (MMR) vaccine  You may need at least one dose of MMR if you were born in 1957 or later. You may also need a second dose. Meningococcal conjugate (MenACWY) vaccine  You may need this if you have certain conditions. Hepatitis A vaccine  You may need this if you have certain conditions or if you travel or work in places where you may be exposed  to hepatitis A. Hepatitis B vaccine  You may need this if you have certain conditions or if you travel or work in places where you may be exposed to hepatitis B. Haemophilus influenzae type b (Hib) vaccine  You may need this if you have certain conditions. You may receive vaccines as individual doses or as more than one vaccine together in one shot (combination vaccines). Talk with your health care provider about the risks and benefits of combination vaccines. What tests do I need? Blood tests  Lipid and cholesterol levels. These may be checked every 5 years, or more frequently depending on your overall health.  Hepatitis C test.  Hepatitis B test. Screening  Lung cancer screening. You may have this screening every year starting at age 72 if you have a 30-pack-year history of smoking and currently smoke or have quit within the past 15 years.  Colorectal cancer screening. All adults should have this screening starting at age 72 and continuing until age 11. Your health care provider may recommend screening at age 72 if you are at increased risk. You will have tests every 1-10 years, depending on your results and the type of screening test.  Diabetes screening. This is done by checking your blood sugar (glucose) after you have not eaten for a while (fasting). You may have this done every 1-3 years.  Mammogram. This may be done every 1-2 years. Talk with your health care provider about how often you should have regular mammograms.  BRCA-related cancer screening. This may be done if you have a family history of breast, ovarian, tubal, or peritoneal cancers.  Other tests  Sexually transmitted disease (STD) testing.  Bone density scan. This is done to screen for osteoporosis. You may have this done starting at age 72. Follow these instructions at home: Eating and drinking  Eat a diet that includes fresh fruits and vegetables, whole grains, lean protein, and low-fat dairy products. Limit  your intake of foods with high amounts of sugar, saturated fats, and salt.  Take vitamin and mineral supplements as recommended by your health care provider.  Do not drink alcohol if your health care provider tells you not to drink.  If you drink alcohol: ? Limit how much you have to 0-1 drink a day. ? Be aware of how much alcohol is in your drink. In the [place] one drink equals one 12 oz bottle of beer (355 mL), one 5 oz glass of wine (148 mL), or one 1 oz glass of hard liquor (44 mL). Lifestyle  Take daily care of your teeth and gums.  Stay active. Exercise for at least 30 minutes on 5 or more days each week.  Do not use any products that contain nicotine or tobacco, such as cigarettes, e-cigarettes, and chewing tobacco. If you need help quitting, ask your health care provider.  If you are sexually active, practice safe sex. Use a condom or other form of protection in order to prevent STIs (sexually transmitted infections).  Talk with your health care provider about taking a low-dose aspirin or statin. What's next?  Go to your health care provider once a year for a well check visit.  Ask your health care provider how often you should have your eyes and teeth checked.  Stay up to date on all vaccines. This information is not intended to replace advice given to you by your health care provider. Make sure you discuss any questions you have with your health care provider. Document Revised: 07/30/2018 Document Reviewed: 07/30/2018 Elsevier Patient Education  2020 [employer].

## 2020-09-13 ENCOUNTER — Ambulatory Visit: Payer: Medicare Other | Admitting: Cardiovascular Disease

## 2020-09-20 DIAGNOSIS — I5032 Chronic diastolic (congestive) heart failure: Secondary | ICD-10-CM | POA: Diagnosis not present

## 2020-09-20 DIAGNOSIS — E211 Secondary hyperparathyroidism, not elsewhere classified: Secondary | ICD-10-CM | POA: Diagnosis not present

## 2020-09-20 DIAGNOSIS — E870 Hyperosmolality and hypernatremia: Secondary | ICD-10-CM | POA: Diagnosis not present

## 2020-09-20 DIAGNOSIS — I129 Hypertensive chronic kidney disease with stage 1 through stage 4 chronic kidney disease, or unspecified chronic kidney disease: Secondary | ICD-10-CM | POA: Diagnosis not present

## 2020-09-20 DIAGNOSIS — N184 Chronic kidney disease, stage 4 (severe): Secondary | ICD-10-CM | POA: Diagnosis not present

## 2020-09-22 DIAGNOSIS — I129 Hypertensive chronic kidney disease with stage 1 through stage 4 chronic kidney disease, or unspecified chronic kidney disease: Secondary | ICD-10-CM | POA: Diagnosis not present

## 2020-09-22 DIAGNOSIS — N184 Chronic kidney disease, stage 4 (severe): Secondary | ICD-10-CM | POA: Diagnosis not present

## 2020-09-22 DIAGNOSIS — I5032 Chronic diastolic (congestive) heart failure: Secondary | ICD-10-CM | POA: Diagnosis not present

## 2020-09-22 DIAGNOSIS — E870 Hyperosmolality and hypernatremia: Secondary | ICD-10-CM | POA: Diagnosis not present

## 2020-09-22 DIAGNOSIS — R809 Proteinuria, unspecified: Secondary | ICD-10-CM | POA: Diagnosis not present

## 2020-09-27 ENCOUNTER — Ambulatory Visit (INDEPENDENT_AMBULATORY_CARE_PROVIDER_SITE_OTHER): Payer: Medicare Other | Admitting: Endocrinology

## 2020-09-27 ENCOUNTER — Other Ambulatory Visit: Payer: Self-pay

## 2020-09-27 ENCOUNTER — Ambulatory Visit: Payer: Medicare Other | Admitting: Endocrinology

## 2020-09-27 VITALS — BP 146/70 | HR 71 | Ht 65.0 in | Wt 244.6 lb

## 2020-09-27 DIAGNOSIS — E039 Hypothyroidism, unspecified: Secondary | ICD-10-CM | POA: Diagnosis not present

## 2020-09-27 LAB — T4, FREE: Free T4: 1.24 ng/dL (ref 0.60–1.60)

## 2020-09-27 LAB — TSH: TSH: 2.8 u[IU]/mL (ref 0.35–4.50)

## 2020-09-27 NOTE — Patient Instructions (Signed)
Blood tests are requested for you today.  We'll let you know about the results.  Please come back for a follow-up appointment in 1 year.    

## 2020-09-27 NOTE — Progress Notes (Signed)
Subjective:    Patient ID: Regina Horton, female    DOB: 1950-09-23, 70 y.o.   MRN: [id number]  HPI Pt returns for f/u of a small multinodular goiter and hypothyroidism (both dx'ed 2011; f/u US in 2019 showed nodules were again too small to need bx; she has been on synthroid since 2011).   pt states she feels well in general, except for ongoing back pain.   Past Medical History:  Diagnosis Date  . Allergic rhinitis   . Angina at rest [facility])   . Anxiety   . Aortic sclerosis   . Cataract 2016  . Chronic back pain   . Chronic constipation   . CKD (chronic kidney disease)   . Depression   . Fibromyalgia   . GERD (gastroesophageal reflux disease)   . Hyperlipidemia   . Hypertension   . Hypertension   . Mitral regurgitation   . Osteoporosis   . Palpitations   . Thyroid disease   . Tricuspid regurgitation     Past Surgical History:  Procedure Laterality Date  . BACK SURGERY  2005  . BREAST LUMPECTOMY  1970   right   . CATARACT EXTRACTION W/PHACO Right 06/15/2015   Procedure: CATARACT EXTRACTION PHACO AND INTRAOCULAR LENS PLACEMENT (IOC);  Surgeon: Tonny Branch, MD;  Location: [facility];  Service: Ophthalmology;  Laterality: Right;  CDE: 10.33  . CATARACT EXTRACTION W/PHACO Left 07/17/2015   Procedure: CATARACT EXTRACTION PHACO AND INTRAOCULAR LENS PLACEMENT (IOC);  Surgeon: Tonny Branch, MD;  Location: [facility];  Service: Ophthalmology;  Laterality: Left;  CDE:7.60  . NM MYOCAR PERF WALL MOTION  02/16/2009   normal  . ORIF ANKLE FRACTURE Left 10/26/2014   Procedure: OPEN REDUCTION INTERNAL FIXATION (ORIF) ANKLE FRACTURE;  Surgeon: Sanjuana Kava, MD;  Location: [facility];  Service: Orthopedics;  Laterality: Left;  . TONSILLECTOMY  childhood   . TUBAL LIGATION  1973  . US ECHOCARDIOGRAPHY  12/19/2008   mild mitral annular ca+,AOV mildly sclerotic,mild MR & TR    Social History   Socioeconomic History  . Marital status: Married    Spouse name: Minerva Fester  . Number of children: 2  . Years of  education: 35  . Highest education level: High school graduate  Occupational History  . Occupation: disabled   Tobacco Use  . Smoking status: Former Smoker    Packs/day: 1.00    Years: 20.00    Pack years: 20.00    Types: Cigarettes    Quit date: 12/24/2003    Years since quitting: 16.7  . Smokeless tobacco: Never Used  Vaping Use  . Vaping Use: Never used  Substance and Sexual Activity  . Alcohol use: No  . Drug use: Not Currently    Types: Marijuana    Comment: boils marijuana in water and makes a tea about 3 times a week  . Sexual activity: Not Currently    Birth control/protection: Surgical  Other Topics Concern  . Not on file  Social History Narrative  . Not on file   Social Determinants of Health   Financial Resource Strain: Low Risk   . Difficulty of Paying Living Expenses: Not hard at all  Food Insecurity: No Food Insecurity  . Worried About Charity fundraiser in the Last Year: Never true  . Ran Out of Food in the Last Year: Never true  Transportation Needs: No Transportation Needs  . Lack of Transportation (Medical): No  . Lack of Transportation (Non-Medical): No  Physical Activity: Inactive  .  Days of Exercise per Week: 0 days  . Minutes of Exercise per Session: 0 min  Stress: No Stress Concern Present  . Feeling of Stress : Not at all  Social Connections: Moderately Integrated  . Frequency of Communication with Friends and Family: More than three times a week  . Frequency of Social Gatherings with Friends and Family: More than three times a week  . Attends Religious Services: 1 to 4 times per year  . Active Member of Clubs or Organizations: No  . Attends Archivist Meetings: Never  . Marital Status: Married  Human resources officer Violence: Not At Risk  . Fear of Current or Ex-Partner: No  . Emotionally Abused: No  . Physically Abused: No  . Sexually Abused: No    Current Outpatient Medications on File Prior to Visit  Medication Sig Dispense  Refill  . atorvastatin (LIPITOR) 40 MG tablet TAKE 1 TABLET (40 MG TOTAL) BY MOUTH DAILY. 90 tablet 2  . Carboxymethylcellul-Glycerin (REFRESH RELIEVA OP) Apply 1 drop to eye. 6 times per day    . denosumab (PROLIA) 60 MG/ML SOSY injection Inject 60 mg into the skin every 6 (six) months.    . diltiazem (CARDIZEM CD) 120 MG 24 hr capsule Take 120 mg by mouth daily.    Mariane Baumgarten Calcium (STOOL SOFTENER PO) Take by mouth.    . DULoxetine (CYMBALTA) 60 MG capsule TAKE 1 CAPSULE TWICE DAILY 180 capsule 3  . fenofibrate 160 MG tablet TAKE 1 TABLET EVERY DAY 90 tablet 2  . fluticasone (FLONASE) 50 MCG/ACT nasal spray Place 2 sprays into both nostrils daily. 48 g 1  . furosemide (LASIX) 20 MG tablet Take 1-2 tablets (20-40 mg total) by mouth daily. 90 tablet 1  . gabapentin (NEURONTIN) 600 MG tablet Take 1/2 tab every morning and 1 tablet every evening 135 tablet 3  . hydrOXYzine (ATARAX/VISTARIL) 25 MG tablet Take 25-50 mg by mouth at bedtime.    . methocarbamol (ROBAXIN) 500 MG tablet Take 500 mg by mouth 2 (two) times daily as needed.    . metoprolol succinate (TOPROL-XL) 100 MG 24 hr tablet TAKE 1 TABLET EVERY MORNING AND TAKE 1/2 TABLET EVERY EVENING. 135 tablet 2  . omega-3 acid ethyl esters (LOVAZA) 1 g capsule TAKE 2 CAPSULES TWICE DAILY 360 capsule 1  . oxyCODONE-acetaminophen (PERCOCET) 7.5-325 MG tablet Take 1 tablet by mouth every 8 (eight) hours as needed.    . pantoprazole (PROTONIX) 40 MG tablet Take 1 tablet (40 mg total) by mouth daily. 90 tablet 3   No current facility-administered medications on file prior to visit.    Allergies  Allergen Reactions  . Levocetirizine Hives and Itching  . Xyzal [Levocetirizine Dihydrochloride] Hives and Itching  . Codeine     Pt states she does not have any problems with codeine.  . Nickel Rash    Family History  Problem Relation Age of Onset  . Rheum arthritis Mother   . Osteoporosis Mother   . Heart disease Father   . Diabetes Father    . Hyperlipidemia Father   . Bladder Cancer Father   . Heart disease Brother   . Arthritis Brother     BP (!) 146/70 (BP Location: Right Arm, Patient Position: Sitting, Cuff Size: Large)   Pulse 71   Ht '5\' 5"'$  (1.651 m)   Wt 244 lb 9.6 oz (110.9 kg)   SpO2 94%   BMI 40.70 kg/m    Review of Systems  Objective:   Physical Exam VITAL SIGNS:  See vs page GENERAL: no distress NECK: There is no palpable thyroid enlargement.  No thyroid nodule is palpable.  No palpable lymphadenopathy at the anterior neck.     Lab Results  Component Value Date   TSH 2.80 09/27/2020      Assessment & Plan:  Hypothyroidism: well-replaced.  Please continue the same synthroid.

## 2020-09-28 MED ORDER — LEVOTHYROXINE SODIUM 88 MCG PO TABS: 88.0000 ug | ORAL_TABLET | Freq: Every day | ORAL | 3 refills | Status: DC

## 2020-10-16 DIAGNOSIS — M130 Polyarthritis, unspecified: Secondary | ICD-10-CM | POA: Diagnosis not present

## 2020-10-16 DIAGNOSIS — M5459 Other low back pain: Secondary | ICD-10-CM | POA: Diagnosis not present

## 2020-10-16 DIAGNOSIS — M4725 Other spondylosis with radiculopathy, thoracolumbar region: Secondary | ICD-10-CM | POA: Diagnosis not present

## 2020-10-16 DIAGNOSIS — Z79891 Long term (current) use of opiate analgesic: Secondary | ICD-10-CM | POA: Diagnosis not present

## 2020-10-16 DIAGNOSIS — M961 Postlaminectomy syndrome, not elsewhere classified: Secondary | ICD-10-CM | POA: Diagnosis not present

## 2020-10-16 DIAGNOSIS — G894 Chronic pain syndrome: Secondary | ICD-10-CM | POA: Diagnosis not present

## 2020-10-16 DIAGNOSIS — G4700 Insomnia, unspecified: Secondary | ICD-10-CM | POA: Diagnosis not present

## 2020-11-11 ENCOUNTER — Other Ambulatory Visit: Payer: Self-pay | Admitting: Cardiovascular Disease

## 2020-11-15 ENCOUNTER — Other Ambulatory Visit: Payer: Self-pay | Admitting: Cardiovascular Disease

## 2020-11-16 ENCOUNTER — Encounter: Payer: Self-pay | Admitting: Family Medicine

## 2020-11-20 DIAGNOSIS — Z23 Encounter for immunization: Secondary | ICD-10-CM | POA: Diagnosis not present

## 2020-11-20 DIAGNOSIS — I5032 Chronic diastolic (congestive) heart failure: Secondary | ICD-10-CM | POA: Diagnosis not present

## 2020-11-20 DIAGNOSIS — I129 Hypertensive chronic kidney disease with stage 1 through stage 4 chronic kidney disease, or unspecified chronic kidney disease: Secondary | ICD-10-CM | POA: Diagnosis not present

## 2020-11-20 DIAGNOSIS — N184 Chronic kidney disease, stage 4 (severe): Secondary | ICD-10-CM | POA: Diagnosis not present

## 2020-11-20 DIAGNOSIS — R809 Proteinuria, unspecified: Secondary | ICD-10-CM | POA: Diagnosis not present

## 2020-11-20 DIAGNOSIS — E870 Hyperosmolality and hypernatremia: Secondary | ICD-10-CM | POA: Diagnosis not present

## 2020-12-21 ENCOUNTER — Other Ambulatory Visit: Payer: Self-pay

## 2020-12-21 ENCOUNTER — Encounter: Payer: Self-pay | Admitting: Cardiovascular Disease

## 2020-12-21 ENCOUNTER — Ambulatory Visit (INDEPENDENT_AMBULATORY_CARE_PROVIDER_SITE_OTHER): Payer: Medicare Other | Admitting: Cardiovascular Disease

## 2020-12-21 DIAGNOSIS — M549 Dorsalgia, unspecified: Secondary | ICD-10-CM | POA: Diagnosis not present

## 2020-12-21 DIAGNOSIS — F419 Anxiety disorder, unspecified: Secondary | ICD-10-CM | POA: Diagnosis not present

## 2020-12-21 DIAGNOSIS — E039 Hypothyroidism, unspecified: Secondary | ICD-10-CM

## 2020-12-21 DIAGNOSIS — I10 Essential (primary) hypertension: Secondary | ICD-10-CM

## 2020-12-21 DIAGNOSIS — F32A Depression, unspecified: Secondary | ICD-10-CM | POA: Diagnosis not present

## 2020-12-21 DIAGNOSIS — E782 Mixed hyperlipidemia: Secondary | ICD-10-CM

## 2020-12-21 DIAGNOSIS — G8929 Other chronic pain: Secondary | ICD-10-CM | POA: Diagnosis not present

## 2020-12-21 DIAGNOSIS — R600 Localized edema: Secondary | ICD-10-CM | POA: Diagnosis not present

## 2020-12-21 NOTE — Progress Notes (Signed)
Patient ID: Regina Horton, female   DOB: 1951-01-12, 70 y.o.   MRN: [id number]      Primary MD:  Dr. Tula Nakayama  HPI: ARMA REINING is a 70 y.o. female who presents to the office today for a 12 month follow up cardiology evaluation.  Ms Bonnes has a history of palpitations which have been controlled with beta blocker therapy, hypertension, mixed hyperlipidemia, obesity, mild renal insufficiency, and chronic low back pain.  An echo Doppler study in 2010  showed mild aortic sclerosis without stenosis, mild mitral and calcification with mild MR, and mild TR, and normal systolic function.  A nuclear perfusion study showed normal perfusion.  She has a significant history of depression and takes Cymbalta, both for depression, as well as for chronic low back pain.  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  She walks with a cane.  Her low back pain has been debilitating.  She is follow-up Dr. Birdie Sons for renal insufficiency. She has had issues with chronic pain in particular back pain.  She has seen Dr. Jeanell Sparrow most for pain management.  She recently stopped taking her pain medications due to concerns at the were adversely affecting her.  She continues to have issues with depression and cries often, because her back hurts.  She also has noticed some palpitations.    When I saw her in January 2018, she  continued to have low back issues and was being evaluated by Dr. Ellene Route.  She is been evaluated by Dr. Ellene Route.She does experience frequent ankle swelling almost daily and occasionally this is more pronounced.  She denies any episodes of chest pain, palpitations, presyncope or syncope.  On 05/19/2017: Creatinine was 1.5.  Hemoglobin 13.6.  Total cholesterol was 150, HDL 57, LDL 57, and triglycerides are mildly increased at 179.  She walks with a walker primarily due to knee discomfort.    When I   saw her in June 2019 she was feeling well on low-dose hydrochlorothiazide and had improvement in her ankle edema.  Laboratory tended to show stable renal function.  I saw her in December 2019 at which time she continued to feel well.  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  She has experienced rare nonexertional episodes of chest pain which are atypical and not representative of angina.  Her last creatinine in July 2019 was 1.7.  Lipid studies revealed total cholesterol 142, HDL 65, LDL 53, and triglycerides 118.  She was followed by Dr. Lowanda Foster with reference to her renal disease.    I  evaluated her in a telemedicine visit in November 2020.  Dr. Lowanda Foster had retired and she was establishing care with his new partner Dr. Theador Hawthorne.  She sees him every 2 to 3 months.  Since her last evaluation with me she has seen Dr. Renato Shin for endocrinology care as well as Dr. Adam Phenix for primary care, being last seen April 2021.  When I last saw her, she was on increased levothyroxine dose at 75 mcg.  She denied any chest pain.  There noted some mild shortness of breath with activity but she is not very active.  She denies palpitations.  She continues to have chronic back pain and has degenerative disc disease followed by Dr. Ellene Route involving 7 discs.  She notes occasional swelling.    Since  I last saw her, she has been followed by Dr. Theador Hawthorne of nephrology in [place].  She has stage IV CKD and most recent laboratory in September 2021 showed a creatinine of 2.13 with a GFR of 23.  She has continued to have episodic leg swelling and does wear support stockings intermittently.  She is on diltiazem 120 mg, furosemide 40 mg daily, and was recently started on low-dose losartan 12.5 mg.  She is scheduled with for follow-up laboratory with Dr. Theador Hawthorne.  She has hypothyroidism and is on levothyroxine at 88 mcg.  She has been on  atorvastatin 40 mg, fenofibrate 160 mg, and omega-3 fatty acid low vase a 2 capsules twice a day.  This had resulted in stabilization of her triglycerides.  Her last lipid study in April 2021 showed an LDL cholesterol at 55 with triglycerides 117, HDL 60 total cholesterol 136.  She was apparently told by her insurance company that she will need to get approval for her lovaza.  She denies any chest pain PND orthopnea.  She denies recent shortness of breath.  She presents for follow-up evaluation.  Past Medical History:  Diagnosis Date  . Allergic rhinitis   . Angina at rest [facility])   . Anxiety   . Aortic sclerosis   . Cataract 2016  . Chronic back pain   . Chronic constipation   . CKD (chronic kidney disease)   . Depression   . Fibromyalgia   . GERD (gastroesophageal reflux disease)   . Hyperlipidemia   . Hypertension   . Hypertension   . Mitral regurgitation   . Osteoporosis   . Palpitations   . Thyroid disease   . Tricuspid regurgitation     Past Surgical History:  Procedure Laterality Date  . BACK SURGERY  2005  . BREAST LUMPECTOMY  1970   right   . CATARACT EXTRACTION W/PHACO Right 06/15/2015   Procedure: CATARACT EXTRACTION PHACO AND INTRAOCULAR LENS PLACEMENT (IOC);  Surgeon: Tonny Branch, MD;  Location: [facility];  Service: Ophthalmology;  Laterality: Right;  CDE: 10.33  . CATARACT EXTRACTION W/PHACO Left 07/17/2015   Procedure: CATARACT EXTRACTION PHACO AND INTRAOCULAR LENS PLACEMENT (IOC);  Surgeon: Tonny Branch, MD;  Location: [facility];  Service: Ophthalmology;  Laterality: Left;  CDE:7.60  . NM MYOCAR PERF WALL MOTION  02/16/2009   normal  . ORIF ANKLE FRACTURE Left 10/26/2014   Procedure: OPEN REDUCTION INTERNAL FIXATION (ORIF) ANKLE FRACTURE;  Surgeon: Sanjuana Kava, MD;  Location: [facility];  Service: Orthopedics;  Laterality: Left;  . TONSILLECTOMY  childhood   . TUBAL LIGATION  1973  . US ECHOCARDIOGRAPHY  12/19/2008   mild mitral annular ca+,AOV mildly sclerotic,mild MR & TR     Allergies  Allergen Reactions  . Levocetirizine Hives and Itching  . Xyzal [Levocetirizine Dihydrochloride] Hives and Itching  . Codeine     Pt states she does not have any problems with codeine.  . Nickel Rash    Current Outpatient Medications  Medication Sig Dispense Refill  . atorvastatin (LIPITOR) 40 MG tablet TAKE 1 TABLET (40 MG TOTAL) BY MOUTH DAILY. 90 tablet 2  . Carboxymethylcellul-Glycerin (REFRESH RELIEVA OP) Apply 1 drop to eye. 6 times per day    . denosumab (PROLIA) 60 MG/ML SOSY injection Inject 60 mg into the skin every 6 (six) months.    . diltiazem (CARDIZEM CD) 120 MG 24 hr capsule Take 120 mg by mouth daily.    Mariane Baumgarten Calcium (STOOL SOFTENER PO) Take by mouth.    Marland Kitchen  DULoxetine (CYMBALTA) 60 MG capsule TAKE 1 CAPSULE TWICE DAILY 180 capsule 3  . fenofibrate 160 MG tablet TAKE 1 TABLET EVERY DAY 90 tablet 2  . fluticasone (FLONASE) 50 MCG/ACT nasal spray Place 2 sprays into both nostrils daily. 48 g 1  . furosemide (LASIX) 20 MG tablet Take 1-2 tablets (20-40 mg total) by mouth daily. 90 tablet 1  . gabapentin (NEURONTIN) 600 MG tablet Take 1/2 tab every morning and 1 tablet every evening 135 tablet 3  . hydrOXYzine (ATARAX/VISTARIL) 25 MG tablet Take 25-50 mg by mouth at bedtime.    Marland Kitchen levothyroxine (SYNTHROID) 88 MCG tablet Take 1 tablet (88 mcg total) by mouth daily. 90 tablet 3  . losartan (COZAAR) 25 MG tablet Take 12.5 mg by mouth daily.    . methocarbamol (ROBAXIN) 500 MG tablet Take 500 mg by mouth 2 (two) times daily as needed.    . metoprolol succinate (TOPROL-XL) 100 MG 24 hr tablet TAKE 1 TABLET EVERY MORNING AND TAKE 1/2 TABLET EVERY EVENING. 135 tablet 2  . omega-3 acid ethyl esters (LOVAZA) 1 g capsule TAKE 2 CAPSULES TWICE DAILY 360 capsule 1  . oxyCODONE-acetaminophen (PERCOCET) 7.5-325 MG tablet Take 1 tablet by mouth every 8 (eight) hours as needed.    . pantoprazole (PROTONIX) 40 MG tablet Take 1 tablet (40 mg total) by mouth daily. 90 tablet  3   No current facility-administered medications for this visit.    Social History   Socioeconomic History  . Marital status: Married    Spouse name: Minerva Fester  . Number of children: 2  . Years of education: 46  . Highest education level: High school graduate  Occupational History  . Occupation: disabled   Tobacco Use  . Smoking status: Former Smoker    Packs/day: 1.00    Years: 20.00    Pack years: 20.00    Types: Cigarettes    Quit date: 12/24/2003    Years since quitting: 17.0  . Smokeless tobacco: Never Used  Vaping Use  . Vaping Use: Never used  Substance and Sexual Activity  . Alcohol use: No  . Drug use: Not Currently    Types: Marijuana    Comment: boils marijuana in water and makes a tea about 3 times a week  . Sexual activity: Not Currently    Birth control/protection: Surgical  Other Topics Concern  . Not on file  Social History Narrative  . Not on file   Social Determinants of Health   Financial Resource Strain: Low Risk   . Difficulty of Paying Living Expenses: Not hard at all  Food Insecurity: No Food Insecurity  . Worried About Charity fundraiser in the Last Year: Never true  . Ran Out of Food in the Last Year: Never true  Transportation Needs: No Transportation Needs  . Lack of Transportation (Medical): No  . Lack of Transportation (Non-Medical): No  Physical Activity: Inactive  . Days of Exercise per Week: 0 days  . Minutes of Exercise per Session: 0 min  Stress: No Stress Concern Present  . Feeling of Stress : Not at all  Social Connections: Moderately Integrated  . Frequency of Communication with Friends and Family: More than three times a week  . Frequency of Social Gatherings with Friends and Family: More than three times a week  . Attends Religious Services: 1 to 4 times per year  . Active Member of Clubs or Organizations: No  . Attends Archivist Meetings: Never  . Marital  Status: Married  Human resources officer Violence: Not At Risk   . Fear of Current or Ex-Partner: No  . Emotionally Abused: No  . Physically Abused: No  . Sexually Abused: No    Family History  Problem Relation Age of Onset  . Rheum arthritis Mother   . Osteoporosis Mother   . Heart disease Father   . Diabetes Father   . Hyperlipidemia Father   . Bladder Cancer Father   . Heart disease Brother   . Arthritis Brother     ROS General: Negative; No fevers, chills, or night sweats HEENT: Negative; No changes in vision or hearing, sinus congestion, difficulty swallowing Pulmonary: Negative; No cough, wheezing, shortness of breath, hemoptysis Cardiovascular: See HPI: No chest pain, presyncope, syncope, palpitations Positive for occasional ankle swelling GI: Positive for GERD. No nausea, vomiting, diarrhea, or abdominal pain GU: Negative; No dysuria, hematuria, or difficulty voiding Musculoskeletal: Positive for chronic low back pain and knee discomfort Hematologic: Negative; no easy bruising, bleeding Endocrine: Positive for hypothyroidism.  No diabetes Neuro: Negative; no changes in balance, headaches Skin: Negative; No rashes or skin lesions Psychiatric: Positive for depression and anxiety Sleep: Negative; No snoring,  daytime sleepiness, hypersomnolence, bruxism, restless legs, hypnogognic hallucinations. Other comprehensive 14 point system review is negative   Physical Exam BP 110/62   Pulse 66   Ht '5\' 5"'  (1.651 m)   Wt 244 lb 12.8 oz (111 kg)   SpO2 94%   BMI 40.74 kg/m    Repeat blood pressure by me was 110/62  Wt Readings from Last 3 Encounters:  12/21/20 244 lb 12.8 oz (111 kg)  09/27/20 244 lb 9.6 oz (110.9 kg)  01/11/20 246 lb (111.6 kg)   General: Alert, oriented, no distress.  Skin: normal turgor, no rashes, warm and dry HEENT: Normocephalic, atraumatic. Pupils equal round and reactive to light; sclera anicteric; extraocular muscles intact;  Nose without nasal septal hypertrophy Mouth/Parynx benign; Mallinpatti scale  3 Neck: No JVD, no carotid bruits; normal carotid upstroke Lungs: clear to ausculatation and percussion; no wheezing or rales Chest wall: without tenderness to palpitation Heart: PMI not displaced, RRR, s1 s2 normal, 1/6 systolic murmur, no diastolic murmur, no rubs, gallops, thrills, or heaves Abdomen: soft, nontender; no hepatosplenomehaly, BS+; abdominal aorta nontender and not dilated by palpation. Back: no CVA tenderness Pulses 2+ Musculoskeletal: full range of motion, normal strength, no joint deformities Extremities: 2+ ankle edema; she was not wearing her support stockings.  No clubbing cyanosis, Homan's sign negative  Neurologic: grossly nonfocal; Cranial nerves grossly wnl Psychologic: Normal mood and affect   ECG (independently read by me): NSR at 66; IRBBB, nonspecific T wave abnormality  May 2021 ECG (independently read by me): NSR with mild sinus arrythmia; normal intervals, NST change; IRBBB  July 28, 2018 ECG (independently read by me): Normal sinus rhythm at 65 bpm.  Incomplete right bundle branch block.  Anterior nonspecific ST-T changes  January 28, 2018 ECG (independently read by me): Sinus rhythm at 68 bpm.  Nonspecific ST-T changes.  Incomplete right bundle branch block.  July 17, 2017 ECG (independently read by me): Sinus bradycardia 55 bpm.  Nonspecific ST changes.  Normal intervals.  No ectopy.  09/17/2016 ECG (independently read by me): Sinus rhythm with incomplete right bundle branch block.  Nonspecific ST-T changes.  QTc interval 431 ms, PR interval 156 ms.  September 14 2015 ECG (independently read by me):  Sinus bradycardia 57 bpm.  Incomplete right bundle branch block.  September 2015ECG (independently read  by me): Normal sinus rhythm with incomplete right bundle branch block.  LABS:  BMP Latest Ref Rng & Units 12/15/2019 03/05/2018 01/01/2017  Glucose 65 - 99 mg/dL 113(H) 98 111(H)  BUN 8 - 27 mg/dL 28(H) 28(H) 18  Creatinine 0.57 - 1.00 mg/dL  1.91(H) 1.79(H) 1.32(H)  BUN/Creat Ratio 12 - '28 15 16 14  ' Sodium 134 - 144 mmol/L 145(H) 144 144  Potassium 3.5 - 5.2 mmol/L 4.4 5.3(H) 4.7  Chloride 96 - 106 mmol/L 106 104 103  CO2 20 - 29 mmol/L '24 24 23  ' Calcium 8.7 - 10.3 mg/dL 10.4(H) 10.2 10.2   Hepatic Function Latest Ref Rng & Units 12/15/2019 03/05/2018 01/01/2017  Total Protein 6.0 - 8.5 g/dL 6.9 6.9 7.0  Albumin 3.8 - 4.8 g/dL 4.5 4.6 4.5  AST 0 - 40 IU/L '28 30 18  ' ALT 0 - 32 IU/L '16 15 10  ' Alk Phosphatase 39 - 117 IU/L 52 47 46  Total Bilirubin 0.0 - 1.2 mg/dL 0.6 0.4 0.5  Bilirubin, Direct 0.0 - 0.3 mg/dL - - -   CBC Latest Ref Rng & Units 12/15/2019 03/05/2018 09/28/2015  WBC 3.4 - 10.8 x10E3/uL 7.0 7.0 8.2  Hemoglobin 11.1 - 15.9 g/dL 13.8 13.2 12.2  Hematocrit 34.0 - 46.6 % 40.2 41.3 36.7  Platelets 150 - 450 x10E3/uL 334 380 417(H)   Lab Results  Component Value Date   MCV 93 12/15/2019   MCV 93 03/05/2018   MCV 92 09/28/2015   Lab Results  Component Value Date   TSH 2.80 09/27/2020   Lab Results  Component Value Date   HGBA1C 4.8 07/07/2014   Lipid Panel     Component Value Date/Time   CHOL 136 12/15/2019 1423   TRIG 117 12/15/2019 1423   HDL 60 12/15/2019 1423   CHOLHDL 2.3 12/15/2019 1423   CHOLHDL 3.0 05/12/2015 1459   VLDL 30 05/12/2015 1459   LDLCALC 55 12/15/2019 1423    RADIOLOGY: No results found.  IMPRESSION: 1. Mixed hyperlipidemia   2. Essential hypertension   3. Lower extremity edema   4. Hypothyroidism, unspecified type   5. Anxiety and depression   6. Morbid obesity ([place])   7. Chronic back pain, unspecified back location, unspecified back pain laterality     ASSESSMENT AND PLAN: Ms. Keylani Perlstein is a 70 year-old female who has a history of depression and continues to take Cymbalta 60 mg daily.  In the past she had frequent crying spells but these have improved.  Presently she has been on diltiazem 240 mg daily, metoprolol succinate 100 mg in the morning and 50 mg in the evening  for blood pressure control and recently has been taking furosemide 40 mg daily.  She is followed closely by Dr. Theador Hawthorne for her stage IV CKD and recently was started on low-dose losartan 12.5 mg.  She continues to have ankle swelling today.  Creatinine in September 2021 was 2.13.  She is scheduled to see her nephrologist in the near future and as result I will not adjust her diuretic regimen but will await for follow-up laboratory in his assessment.  She is not having any chest pain or anginal symptoms.  Presently she denies significant shortness of breath.  She is not very active due to her chronic back discomfort and she has significant disc disease of at least 7 discs and is followed by Dr. Ellene Route who recommends conservative strategy.  She is morbidly obese with a BMI of 40.7.  Weight loss and  increased activity was strongly recommended.  She has been on lipid-lowering therapy with atorvastatin 40 mg, Lovaza 2 capsules twice a day in addition to fenofibrate 160 mg.  In the past when she was taken off the lovaza  her triglycerides increased.  We will try to get her approved with her recent insurance denial.  She continues to be on Cymbalta for her depression.  She is now on pantoprazole also for GERD.  I will see her in 6 months for reevaluation    Troy Sine, MD, [facility]  12/21/2020 6:04 PM

## 2020-12-21 NOTE — Patient Instructions (Signed)
Medication Instructions:  Your physician recommends that you continue on your current medications as directed. Please refer to the Current Medication list given to you today.  *If you need a refill on your cardiac medications before your next appointment, please call your pharmacy*   Lab Work: Your physician recommends that you return for lab work in: next week or so for fasting cholesterol levels  If you have labs (blood work) drawn today and your tests are completely normal, you will receive your results only by: Marland Kitchen MyChart Message (if you have MyChart) OR . A paper copy in the mail If you have any lab test that is abnormal or we need to change your treatment, we will call you to review the results.   Follow-Up: At [facility], you and your health needs are our priority.  As part of our continuing mission to provide you with exceptional heart care, we have created designated Provider Care Teams.  These Care Teams include your primary Cardiologist (physician) and Advanced Practice Providers (APPs -  Physician Assistants and Nurse Practitioners) who all work together to provide you with the care you need, when you need it.  We recommend signing up for the patient portal called "MyChart".  Sign up information is provided on this After Visit Summary.  MyChart is used to connect with patients for Virtual Visits (Telemedicine).  Patients are able to view lab/test results, encounter notes, upcoming appointments, etc.  Non-urgent messages can be sent to your provider as well.   To learn more about what you can do with MyChart, go to [email or web address].    Your next appointment:   6 month(s)  The format for your next appointment:   In Person  Provider:   Shelva Majestic, MD

## 2020-12-22 ENCOUNTER — Ambulatory Visit: Payer: Medicare Other | Admitting: Cardiovascular Disease

## 2020-12-27 ENCOUNTER — Ambulatory Visit: Payer: Medicare Other | Admitting: Family Medicine

## 2021-01-02 ENCOUNTER — Ambulatory Visit: Payer: Medicare Other | Admitting: Family Medicine

## 2021-01-04 DIAGNOSIS — I5033 Acute on chronic diastolic (congestive) heart failure: Secondary | ICD-10-CM | POA: Diagnosis not present

## 2021-01-04 DIAGNOSIS — N184 Chronic kidney disease, stage 4 (severe): Secondary | ICD-10-CM | POA: Diagnosis not present

## 2021-01-04 DIAGNOSIS — E782 Mixed hyperlipidemia: Secondary | ICD-10-CM | POA: Diagnosis not present

## 2021-01-04 DIAGNOSIS — I10 Essential (primary) hypertension: Secondary | ICD-10-CM | POA: Diagnosis not present

## 2021-01-04 DIAGNOSIS — R809 Proteinuria, unspecified: Secondary | ICD-10-CM | POA: Diagnosis not present

## 2021-01-04 DIAGNOSIS — I129 Hypertensive chronic kidney disease with stage 1 through stage 4 chronic kidney disease, or unspecified chronic kidney disease: Secondary | ICD-10-CM | POA: Diagnosis not present

## 2021-01-05 LAB — LIPID PANEL
Chol/HDL Ratio: 2.7 ratio (ref 0.0–4.4)
Cholesterol, Total: 156 mg/dL (ref 100–199)
HDL: 58 mg/dL (ref >39)
LDL Chol Calc (NIH): 72 mg/dL (ref 0–99)
Triglycerides: 156 mg/dL — ABNORMAL HIGH (ref 0–149)
VLDL Cholesterol Cal: 26 mg/dL (ref 5–40)

## 2021-01-08 DIAGNOSIS — Z79891 Long term (current) use of opiate analgesic: Secondary | ICD-10-CM | POA: Diagnosis not present

## 2021-01-08 DIAGNOSIS — M130 Polyarthritis, unspecified: Secondary | ICD-10-CM | POA: Diagnosis not present

## 2021-01-08 DIAGNOSIS — M4725 Other spondylosis with radiculopathy, thoracolumbar region: Secondary | ICD-10-CM | POA: Diagnosis not present

## 2021-01-08 DIAGNOSIS — M5459 Other low back pain: Secondary | ICD-10-CM | POA: Diagnosis not present

## 2021-01-08 DIAGNOSIS — G894 Chronic pain syndrome: Secondary | ICD-10-CM | POA: Diagnosis not present

## 2021-01-08 DIAGNOSIS — G4700 Insomnia, unspecified: Secondary | ICD-10-CM | POA: Diagnosis not present

## 2021-01-08 DIAGNOSIS — M961 Postlaminectomy syndrome, not elsewhere classified: Secondary | ICD-10-CM | POA: Diagnosis not present

## 2021-01-09 ENCOUNTER — Encounter: Payer: Self-pay | Admitting: Family Medicine

## 2021-01-17 ENCOUNTER — Other Ambulatory Visit: Payer: Self-pay | Admitting: Family Medicine

## 2021-01-18 ENCOUNTER — Telehealth: Payer: Self-pay | Admitting: *Deleted

## 2021-01-18 NOTE — Telephone Encounter (Signed)
Message left for patient to call back and ask for Abigail Butts. Patient is due for prolia injection. Patient must have labs prior to receiving.

## 2021-01-19 NOTE — Telephone Encounter (Signed)
Patient called and had results sent to [place] from Dr. Theador Hawthorne. Results will be scanned in chart. Prolia order for upcoming appointment.

## 2021-02-08 ENCOUNTER — Ambulatory Visit (INDEPENDENT_AMBULATORY_CARE_PROVIDER_SITE_OTHER): Payer: Medicare Other | Admitting: Family Medicine

## 2021-02-08 ENCOUNTER — Other Ambulatory Visit: Payer: Self-pay

## 2021-02-08 ENCOUNTER — Encounter: Payer: Self-pay | Admitting: Family Medicine

## 2021-02-08 ENCOUNTER — Other Ambulatory Visit: Payer: Self-pay | Admitting: Cardiovascular Disease

## 2021-02-08 VITALS — BP 155/76 | HR 68 | Temp 97.9°F | Ht 65.0 in | Wt 244.8 lb

## 2021-02-08 DIAGNOSIS — M810 Age-related osteoporosis without current pathological fracture: Secondary | ICD-10-CM | POA: Diagnosis not present

## 2021-02-08 MED ORDER — FUROSEMIDE 20 MG PO TABS: ORAL_TABLET | ORAL | 1 refills | Status: DC

## 2021-02-08 MED ORDER — DENOSUMAB 60 MG/ML ~~LOC~~ SOSY
60.0000 mg | PREFILLED_SYRINGE | Freq: Once | SUBCUTANEOUS | Status: AC
  Administered 2021-02-08: 60 mg via SUBCUTANEOUS

## 2021-02-08 NOTE — Progress Notes (Signed)
Subjective: CC: Osteoporosis PCP: Regina Norlander, DO DP:[id number] [name] is a 70 y.o. female presenting to clinic today for:  1.  Osteoporosis Patient is here for osteoporosis treatment with Prolia.  Overall she is doing relatively well.  She continues to follow-up with her cardiologist, nephrologist regularly.  Nephrologist has most recently added Cozaar 12.5 mg daily.  She is tolerating this without difficulty.  She also sees her endocrinologist once per year with most recent change in her Synthroid 88 mcg.   ROS: Per HPI  Allergies  Allergen Reactions   Levocetirizine Hives and Itching   Xyzal [Levocetirizine Dihydrochloride] Hives and Itching   Codeine     Pt states she does not have any problems with codeine.   Nickel Rash   Past Medical History:  Diagnosis Date   Allergic rhinitis    Angina at rest [facility])    Anxiety    Aortic sclerosis    Cataract 2016   Chronic back pain    Chronic constipation    CKD (chronic kidney disease)    Depression    Fibromyalgia    GERD (gastroesophageal reflux disease)    Hyperlipidemia    Hypertension    Hypertension    Mitral regurgitation    Osteoporosis    Palpitations    Thyroid disease    Tricuspid regurgitation     Current Outpatient Medications:    atorvastatin (LIPITOR) 40 MG tablet, TAKE 1 TABLET (40 MG TOTAL) BY MOUTH DAILY., Disp: 90 tablet, Rfl: 2   Carboxymethylcellul-Glycerin (REFRESH RELIEVA OP), Apply 1 drop to eye. 6 times per day, Disp: , Rfl:    denosumab (PROLIA) 60 MG/ML SOSY injection, Inject 60 mg into the skin every 6 (six) months., Disp: , Rfl:    diltiazem (CARDIZEM CD) 120 MG 24 hr capsule, Take 120 mg by mouth daily., Disp: , Rfl:    Docusate Calcium (STOOL SOFTENER PO), Take by mouth., Disp: , Rfl:    DULoxetine (CYMBALTA) 60 MG capsule, TAKE 1 CAPSULE TWICE DAILY, Disp: 180 capsule, Rfl: 3   fenofibrate 160 MG tablet, TAKE 1 TABLET EVERY DAY, Disp: 90 tablet, Rfl: 2   fluticasone (FLONASE) 50  MCG/ACT nasal spray, USE 2 SPRAYS IN EACH NOSTRIL EVERY DAY, Disp: 48 g, Rfl: 1   furosemide (LASIX) 20 MG tablet, Take 1-2 tablets (20-40 mg total) by mouth daily., Disp: 90 tablet, Rfl: 1   gabapentin (NEURONTIN) 600 MG tablet, Take 1/2 tab every morning and 1 tablet every evening, Disp: 135 tablet, Rfl: 3   hydrOXYzine (ATARAX/VISTARIL) 25 MG tablet, Take 25-50 mg by mouth at bedtime., Disp: , Rfl:    levothyroxine (SYNTHROID) 88 MCG tablet, Take 1 tablet (88 mcg total) by mouth daily., Disp: 90 tablet, Rfl: 3   losartan (COZAAR) 25 MG tablet, Take 12.5 mg by mouth daily., Disp: , Rfl:    methocarbamol (ROBAXIN) 500 MG tablet, Take 500 mg by mouth 2 (two) times daily as needed., Disp: , Rfl:    metoprolol succinate (TOPROL-XL) 100 MG 24 hr tablet, TAKE 1 TABLET EVERY MORNING AND TAKE 1/2 TABLET EVERY EVENING., Disp: 135 tablet, Rfl: 2   omega-3 acid ethyl esters (LOVAZA) 1 g capsule, TAKE 2 CAPSULES TWICE DAILY, Disp: 360 capsule, Rfl: 1   oxyCODONE-acetaminophen (PERCOCET) 7.5-325 MG tablet, Take 1 tablet by mouth every 8 (eight) hours as needed., Disp: , Rfl:    pantoprazole (PROTONIX) 40 MG tablet, Take 1 tablet (40 mg total) by mouth daily., Disp: 90 tablet, Rfl: 3  Social History   Socioeconomic History   Marital status: Married    Spouse name: Regina Horton   Number of children: 2   Years of education: 12   Highest education level: High school graduate  Occupational History   Occupation: disabled   Tobacco Use   Smoking status: Former    Packs/day: 1.00    Years: 20.00    Pack years: 20.00    Types: Cigarettes    Quit date: 12/24/2003    Years since quitting: 17.1   Smokeless tobacco: Never  Vaping Use   Vaping Use: Never used  Substance and Sexual Activity   Alcohol use: No   Drug use: Not Currently    Types: Marijuana    Comment: boils marijuana in water and makes a tea about 3 times a week   Sexual activity: Not Currently    Birth control/protection: Surgical  Other Topics  Concern   Not on file  Social History Narrative   Not on file   Social Determinants of Health   Financial Resource Strain: Low Risk    Difficulty of Paying Living Expenses: Not hard at all  Food Insecurity: No Food Insecurity   Worried About Charity fundraiser in the Last Year: Never true   [place] in the Last Year: Never true  Transportation Needs: No Transportation Needs   Lack of Transportation (Medical): No   Lack of Transportation (Non-Medical): No  Physical Activity: Inactive   Days of Exercise per Week: 0 days   Minutes of Exercise per Session: 0 min  Stress: No Stress Concern Present   Feeling of Stress : Not at all  Social Connections: Moderately Integrated   Frequency of Communication with Friends and Family: More than three times a week   Frequency of Social Gatherings with Friends and Family: More than three times a week   Attends Religious Services: 1 to 4 times per year   Active Member of [employer] or Organizations: No   Attends Music therapist: Never   Marital Status: Married  Human resources officer Violence: Not At Risk   Fear of Current or Ex-Partner: No   Emotionally Abused: No   Physically Abused: No   Sexually Abused: No   Family History  Problem Relation Age of Onset   Rheum arthritis Mother    Osteoporosis Mother    Heart disease Father    Diabetes Father    Hyperlipidemia Father    Bladder Cancer Father    Heart disease Brother    Arthritis Brother     Objective: Office vital signs reviewed. BP (!) 155/76   Pulse 68   Temp 97.9 F (36.6 C)   Ht '5\' 5"'$  (1.651 m)   Wt 244 lb 12.8 oz (111 kg)   SpO2 94%   BMI 40.74 kg/m   Physical Examination:  General: Awake, alert, chronically ill-appearing female, No acute distress Cardio: regular rate and rhythm, S1S2 heard, no murmurs appreciated Pulm: clear to auscultation bilaterally, no wheezes, rhonchi or rales; normal work of breathing on room air MSK: Ambulating with use of  rolling walker  Assessment/ Plan: 70 y.o. female   Osteoporosis without current pathological fracture, unspecified osteoporosis type - Plan: denosumab (PROLIA) injection 60 mg  Doing well on Prolia injection.  This was administered on today's visit.  She voiced no other concerns.  She will follow-up in 6 months for annual physical with repeat Prolia injection and fasting labs.  Continue to follow-up with all other specialist.  Denied refill needed today  No orders of the defined types were placed in this encounter.  No orders of the defined types were placed in this encounter.    Regina Norlander, DO [place] [phone]

## 2021-02-16 NOTE — Telephone Encounter (Signed)
I received my labs as a follow up for my visit to Dr Claiborne Billings and my triglycerides are still high. My insurance no longer pays for my Omega-3-Acid EtH Esters1 gm caps. I received a paper from [place] stating that my Dr. can ask for a coverage determination ( exception) to help with the cost. I currently take 2 tabs twice a day. I cannot seem to control my triglycerides without these pills. They suggested that I take a medication that I have been for years. The Omega is the only thing that seems to help. Enclosed are copies of the papers that they sent me. Thanks for any help   Forwarded to Dr. Claiborne Billings.

## 2021-03-12 ENCOUNTER — Ambulatory Visit: Payer: Medicare Other | Admitting: Family Medicine

## 2021-03-16 ENCOUNTER — Encounter: Payer: Self-pay | Admitting: *Deleted

## 2021-04-03 DIAGNOSIS — M130 Polyarthritis, unspecified: Secondary | ICD-10-CM | POA: Diagnosis not present

## 2021-04-03 DIAGNOSIS — Z79891 Long term (current) use of opiate analgesic: Secondary | ICD-10-CM | POA: Diagnosis not present

## 2021-04-03 DIAGNOSIS — M5459 Other low back pain: Secondary | ICD-10-CM | POA: Diagnosis not present

## 2021-04-03 DIAGNOSIS — G4700 Insomnia, unspecified: Secondary | ICD-10-CM | POA: Diagnosis not present

## 2021-04-03 DIAGNOSIS — M961 Postlaminectomy syndrome, not elsewhere classified: Secondary | ICD-10-CM | POA: Diagnosis not present

## 2021-04-03 DIAGNOSIS — M4725 Other spondylosis with radiculopathy, thoracolumbar region: Secondary | ICD-10-CM | POA: Diagnosis not present

## 2021-04-03 DIAGNOSIS — G894 Chronic pain syndrome: Secondary | ICD-10-CM | POA: Diagnosis not present

## 2021-04-04 ENCOUNTER — Other Ambulatory Visit: Payer: Self-pay | Admitting: Cardiovascular Disease

## 2021-04-05 ENCOUNTER — Encounter: Payer: Self-pay | Admitting: Family Medicine

## 2021-04-11 NOTE — Telephone Encounter (Signed)
I have not seen a PA come across for this.

## 2021-04-11 NOTE — Telephone Encounter (Signed)
We need a prior auth request.  I have no "case number" to give them for the request.

## 2021-04-16 DIAGNOSIS — N184 Chronic kidney disease, stage 4 (severe): Secondary | ICD-10-CM | POA: Diagnosis not present

## 2021-04-16 DIAGNOSIS — I5032 Chronic diastolic (congestive) heart failure: Secondary | ICD-10-CM | POA: Diagnosis not present

## 2021-04-16 DIAGNOSIS — R809 Proteinuria, unspecified: Secondary | ICD-10-CM | POA: Diagnosis not present

## 2021-04-16 DIAGNOSIS — I129 Hypertensive chronic kidney disease with stage 1 through stage 4 chronic kidney disease, or unspecified chronic kidney disease: Secondary | ICD-10-CM | POA: Diagnosis not present

## 2021-04-18 DIAGNOSIS — N184 Chronic kidney disease, stage 4 (severe): Secondary | ICD-10-CM | POA: Diagnosis not present

## 2021-04-18 DIAGNOSIS — I129 Hypertensive chronic kidney disease with stage 1 through stage 4 chronic kidney disease, or unspecified chronic kidney disease: Secondary | ICD-10-CM | POA: Diagnosis not present

## 2021-04-18 DIAGNOSIS — R809 Proteinuria, unspecified: Secondary | ICD-10-CM | POA: Diagnosis not present

## 2021-04-18 DIAGNOSIS — I5032 Chronic diastolic (congestive) heart failure: Secondary | ICD-10-CM | POA: Diagnosis not present

## 2021-05-20 ENCOUNTER — Other Ambulatory Visit: Payer: Self-pay | Admitting: Family Medicine

## 2021-05-20 DIAGNOSIS — K219 Gastro-esophageal reflux disease without esophagitis: Secondary | ICD-10-CM

## 2021-06-18 ENCOUNTER — Other Ambulatory Visit: Payer: Self-pay | Admitting: Cardiovascular Disease

## 2021-07-02 DIAGNOSIS — Z23 Encounter for immunization: Secondary | ICD-10-CM | POA: Diagnosis not present

## 2021-07-02 DIAGNOSIS — I129 Hypertensive chronic kidney disease with stage 1 through stage 4 chronic kidney disease, or unspecified chronic kidney disease: Secondary | ICD-10-CM | POA: Diagnosis not present

## 2021-07-02 DIAGNOSIS — R809 Proteinuria, unspecified: Secondary | ICD-10-CM | POA: Diagnosis not present

## 2021-07-02 DIAGNOSIS — I5032 Chronic diastolic (congestive) heart failure: Secondary | ICD-10-CM | POA: Diagnosis not present

## 2021-07-02 DIAGNOSIS — N184 Chronic kidney disease, stage 4 (severe): Secondary | ICD-10-CM | POA: Diagnosis not present

## 2021-07-05 DIAGNOSIS — E870 Hyperosmolality and hypernatremia: Secondary | ICD-10-CM | POA: Diagnosis not present

## 2021-07-05 DIAGNOSIS — I129 Hypertensive chronic kidney disease with stage 1 through stage 4 chronic kidney disease, or unspecified chronic kidney disease: Secondary | ICD-10-CM | POA: Diagnosis not present

## 2021-07-05 DIAGNOSIS — R809 Proteinuria, unspecified: Secondary | ICD-10-CM | POA: Diagnosis not present

## 2021-07-05 DIAGNOSIS — N184 Chronic kidney disease, stage 4 (severe): Secondary | ICD-10-CM | POA: Diagnosis not present

## 2021-07-05 DIAGNOSIS — I5032 Chronic diastolic (congestive) heart failure: Secondary | ICD-10-CM | POA: Diagnosis not present

## 2021-07-18 ENCOUNTER — Other Ambulatory Visit: Payer: Self-pay | Admitting: Endocrinology

## 2021-07-18 ENCOUNTER — Telehealth: Payer: Self-pay | Admitting: Family Medicine

## 2021-07-18 DIAGNOSIS — M5459 Other low back pain: Secondary | ICD-10-CM | POA: Diagnosis not present

## 2021-07-18 DIAGNOSIS — G4700 Insomnia, unspecified: Secondary | ICD-10-CM | POA: Diagnosis not present

## 2021-07-18 DIAGNOSIS — Z79891 Long term (current) use of opiate analgesic: Secondary | ICD-10-CM | POA: Diagnosis not present

## 2021-07-18 DIAGNOSIS — M961 Postlaminectomy syndrome, not elsewhere classified: Secondary | ICD-10-CM | POA: Diagnosis not present

## 2021-07-18 DIAGNOSIS — G894 Chronic pain syndrome: Secondary | ICD-10-CM | POA: Diagnosis not present

## 2021-07-18 DIAGNOSIS — M130 Polyarthritis, unspecified: Secondary | ICD-10-CM | POA: Diagnosis not present

## 2021-07-18 DIAGNOSIS — M4725 Other spondylosis with radiculopathy, thoracolumbar region: Secondary | ICD-10-CM | POA: Diagnosis not present

## 2021-07-18 NOTE — Telephone Encounter (Signed)
Patient would like to get her PROLIA injection on her appointment date 08/14/21. Will this be possible?

## 2021-07-20 ENCOUNTER — Telehealth: Payer: Self-pay | Admitting: Family Medicine

## 2021-07-20 NOTE — Telephone Encounter (Signed)
Prolia shot has arrived and we need to schedule patient for injection after 08/12/21.

## 2021-07-20 NOTE — Telephone Encounter (Signed)
Patient is able to get on 12/27 and I have put a note in appointment note to give.

## 2021-07-20 NOTE — Telephone Encounter (Addendum)
Prolia scheduled for 12/27

## 2021-08-06 ENCOUNTER — Ambulatory Visit: Payer: Medicare Other | Admitting: Family Medicine

## 2021-08-11 ENCOUNTER — Telehealth: Payer: Medicare Other | Admitting: Family Medicine

## 2021-08-11 DIAGNOSIS — M549 Dorsalgia, unspecified: Secondary | ICD-10-CM

## 2021-08-12 NOTE — Progress Notes (Signed)
[place]  Advised in person for best assessment

## 2021-08-14 ENCOUNTER — Ambulatory Visit (INDEPENDENT_AMBULATORY_CARE_PROVIDER_SITE_OTHER): Payer: Medicare Other | Admitting: Family Medicine

## 2021-08-14 ENCOUNTER — Encounter: Payer: Self-pay | Admitting: Family Medicine

## 2021-08-14 VITALS — BP 135/75 | HR 75 | Temp 97.1°F | Ht 65.0 in | Wt 237.4 lb

## 2021-08-14 DIAGNOSIS — M5440 Lumbago with sciatica, unspecified side: Secondary | ICD-10-CM

## 2021-08-14 DIAGNOSIS — E039 Hypothyroidism, unspecified: Secondary | ICD-10-CM | POA: Diagnosis not present

## 2021-08-14 DIAGNOSIS — Z0001 Encounter for general adult medical examination with abnormal findings: Secondary | ICD-10-CM | POA: Diagnosis not present

## 2021-08-14 DIAGNOSIS — M810 Age-related osteoporosis without current pathological fracture: Secondary | ICD-10-CM | POA: Diagnosis not present

## 2021-08-14 DIAGNOSIS — G8929 Other chronic pain: Secondary | ICD-10-CM

## 2021-08-14 DIAGNOSIS — K219 Gastro-esophageal reflux disease without esophagitis: Secondary | ICD-10-CM | POA: Diagnosis not present

## 2021-08-14 DIAGNOSIS — Z0000 Encounter for general adult medical examination without abnormal findings: Secondary | ICD-10-CM

## 2021-08-14 DIAGNOSIS — R739 Hyperglycemia, unspecified: Secondary | ICD-10-CM | POA: Diagnosis not present

## 2021-08-14 DIAGNOSIS — M5441 Lumbago with sciatica, right side: Secondary | ICD-10-CM

## 2021-08-14 DIAGNOSIS — N184 Chronic kidney disease, stage 4 (severe): Secondary | ICD-10-CM | POA: Diagnosis not present

## 2021-08-14 LAB — BAYER DCA HB A1C WAIVED: HB A1C (BAYER DCA - WAIVED): 4.8 % (ref 4.8–5.6)

## 2021-08-14 MED ORDER — DENOSUMAB 60 MG/ML ~~LOC~~ SOSY
60.0000 mg | PREFILLED_SYRINGE | Freq: Once | SUBCUTANEOUS | Status: AC
  Administered 2021-08-14: 16:00:00 60 mg via SUBCUTANEOUS

## 2021-08-14 MED ORDER — DULOXETINE HCL 60 MG PO CPEP: 60.0000 mg | ORAL_CAPSULE | Freq: Every day | ORAL | 3 refills | Status: DC

## 2021-08-14 MED ORDER — PANTOPRAZOLE SODIUM 40 MG PO TBEC: 40.0000 mg | DELAYED_RELEASE_TABLET | Freq: Every day | ORAL | 3 refills | Status: DC

## 2021-08-14 NOTE — Progress Notes (Signed)
Regina Horton is a 70 y.o. female presents to office today for annual physical exam examination.    Concerns today include: 1.  Osteoporosis Patient due for Prolia injection today.  Has not had vitamin D level collected in a while but had renal function and CBC collected recently with nephrology.  Has next appointment with Dr. Theador Hawthorne in January.  She is due to see her endocrinologist as well in January.  Occupation: retired  Diet: No structured, Exercise: No structured Last eye exam: Up-to-date Last dental exam: Needs Last colonoscopy: Up-to-date Last mammogram: Up-to-date Last pap smear: N/A Refills needed today: Unsure Immunizations needed: Immunization History  Administered Date(s) Administered   Influenza Whole 05/02/2010   Influenza, High Dose Seasonal PF 07/17/2017, 08/15/2017, 06/22/2018   Influenza,inj,Quad PF,6+ Mos 07/07/2014, 05/16/2015, 06/12/2016   Influenza-Unspecified 07/18/2017, 07/01/2018, 06/23/2020, 06/20/2021   Moderna Sars-Covid-2 Vaccination 10/01/2019, 10/29/2019   PFIZER(Purple Top)SARS-COV-2 Vaccination 06/22/2020, 11/20/2020, 06/20/2021   Pneumococcal Conjugate-13 01/09/2015   Pneumococcal Polysaccharide-23 08/19/1998, 02/07/2011, 06/12/2016   Td 08/07/2010   Zoster, Live 02/15/2011     Past Medical History:  Diagnosis Date   Allergic rhinitis    Angina at rest [facility])    Anxiety    Aortic sclerosis    Cataract 2016   Chronic back pain    Chronic constipation    CKD (chronic kidney disease)    Depression    Fibromyalgia    GERD (gastroesophageal reflux disease)    Hyperlipidemia    Hypertension    Hypertension    Mitral regurgitation    Osteoporosis    Palpitations    Thyroid disease    Tricuspid regurgitation    Social History   Socioeconomic History   Marital status: Married    Spouse name: Minerva Fester   Number of children: 2   Years of education: 12   Highest education level: High school graduate  Occupational History    Occupation: disabled   Tobacco Use   Smoking status: Former    Packs/day: 1.00    Years: 20.00    Pack years: 20.00    Types: Cigarettes    Quit date: 12/24/2003    Years since quitting: 17.6   Smokeless tobacco: Never  Vaping Use   Vaping Use: Never used  Substance and Sexual Activity   Alcohol use: No   Drug use: Not Currently    Types: Marijuana    Comment: boils marijuana in water and makes a tea about 3 times a week   Sexual activity: Not Currently    Birth control/protection: Surgical  Other Topics Concern   Not on file  Social History Narrative   Not on file   Social Determinants of Health   Financial Resource Strain: Low Risk    Difficulty of Paying Living Expenses: Not hard at all  Food Insecurity: No Food Insecurity   Worried About Charity fundraiser in the Last Year: Never true   Arboriculturist in the Last Year: Never true  Transportation Needs: No Transportation Needs   Lack of Transportation (Medical): No   Lack of Transportation (Non-Medical): No  Physical Activity: Inactive   Days of Exercise per Week: 0 days   Minutes of Exercise per Session: 0 min  Stress: No Stress Concern Present   Feeling of Stress : Not at all  Social Connections: Moderately Integrated   Frequency of Communication with Friends and Family: More than three times a week   Frequency of Social Gatherings with Friends and Family:  More than three times a week   Attends Religious Services: 1 to 4 times per year   Active Member of Clubs or Organizations: No   Attends Archivist Meetings: Never   Marital Status: Married  Human resources officer Violence: Not At Risk   Fear of Current or Ex-Partner: No   Emotionally Abused: No   Physically Abused: No   Sexually Abused: No   Past Surgical History:  Procedure Laterality Date   BACK SURGERY  2005   BREAST LUMPECTOMY  1970   right    CATARACT EXTRACTION W/PHACO Right 06/15/2015   Procedure: CATARACT EXTRACTION PHACO AND INTRAOCULAR  LENS PLACEMENT ([place]);  Surgeon: Tonny Branch, MD;  Location: [facility];  Service: Ophthalmology;  Laterality: Right;  CDE: 10.33   CATARACT EXTRACTION W/PHACO Left 07/17/2015   Procedure: CATARACT EXTRACTION PHACO AND INTRAOCULAR LENS PLACEMENT (IOC);  Surgeon: Tonny Branch, MD;  Location: [facility];  Service: Ophthalmology;  Laterality: Left;  CDE:7.60   NM MYOCAR PERF WALL MOTION  02/16/2009   normal   ORIF ANKLE FRACTURE Left 10/26/2014   Procedure: OPEN REDUCTION INTERNAL FIXATION (ORIF) ANKLE FRACTURE;  Surgeon: Sanjuana Kava, MD;  Location: [facility];  Service: Orthopedics;  Laterality: Left;   TONSILLECTOMY  childhood    TUBAL LIGATION  1973   US ECHOCARDIOGRAPHY  12/19/2008   mild mitral annular ca+,AOV mildly sclerotic,mild MR & TR   Family History  Problem Relation Age of Onset   Rheum arthritis Mother    Osteoporosis Mother    Heart disease Father    Diabetes Father    Hyperlipidemia Father    Bladder Cancer Father    Heart disease Brother    Arthritis Brother     Current Outpatient Medications:    atorvastatin (LIPITOR) 40 MG tablet, TAKE 1 TABLET (40 MG TOTAL) BY MOUTH DAILY., Disp: 90 tablet, Rfl: 2   Carboxymethylcellul-Glycerin (REFRESH RELIEVA OP), Apply 1 drop to eye. 6 times per day, Disp: , Rfl:    denosumab (PROLIA) 60 MG/ML SOSY injection, Inject 60 mg into the skin every 6 (six) months., Disp: , Rfl:    diltiazem (CARDIZEM CD) 120 MG 24 hr capsule, Take 120 mg by mouth daily., Disp: , Rfl:    Docusate Calcium (STOOL SOFTENER PO), Take by mouth., Disp: , Rfl:    DULoxetine (CYMBALTA) 60 MG capsule, TAKE 1 CAPSULE TWICE DAILY, Disp: 180 capsule, Rfl: 3   fenofibrate 160 MG tablet, TAKE 1 TABLET EVERY DAY, Disp: 90 tablet, Rfl: 2   fluticasone (FLONASE) 50 MCG/ACT nasal spray, USE 2 SPRAYS IN EACH NOSTRIL EVERY DAY, Disp: 48 g, Rfl: 1   furosemide (LASIX) 20 MG tablet, Take 84m each morning,  255meach evening, Disp: 90 tablet, Rfl: 1   gabapentin (NEURONTIN) 600 MG tablet, Take  1/2 tab every morning and 1 tablet every evening, Disp: 135 tablet, Rfl: 3   hydrOXYzine (ATARAX/VISTARIL) 25 MG tablet, Take 25-50 mg by mouth at bedtime., Disp: , Rfl:    levothyroxine (SYNTHROID) 88 MCG tablet, TAKE 1 TABLET (88 MCG TOTAL) BY MOUTH DAILY., Disp: 90 tablet, Rfl: 3   losartan (COZAAR) 25 MG tablet, Take 12.5 mg by mouth daily., Disp: , Rfl:    methocarbamol (ROBAXIN) 500 MG tablet, Take 500 mg by mouth 2 (two) times daily as needed., Disp: , Rfl:    metoprolol succinate (TOPROL-XL) 100 MG 24 hr tablet, TAKE 1 TABLET EVERY MORNING AND TAKE 1/2 TABLET EVERY EVENING., Disp: 135 tablet, Rfl: 1   omega-3  acid ethyl esters (LOVAZA) 1 g capsule, TAKE 2 CAPSULES TWICE DAILY (NEED MD APPOINTMENT FOR REFILLS), Disp: 360 capsule, Rfl: 1   oxyCODONE-acetaminophen (PERCOCET) 7.5-325 MG tablet, Take 1 tablet by mouth every 8 (eight) hours as needed., Disp: , Rfl:    pantoprazole (PROTONIX) 40 MG tablet, TAKE 1 TABLET EVERY DAY, Disp: 90 tablet, Rfl: 0  Allergies  Allergen Reactions   Levocetirizine Hives and Itching   Xyzal [Levocetirizine Dihydrochloride] Hives and Itching   Codeine     Pt states she does not have any problems with codeine.   Nickel Rash     ROS: Review of Systems A comprehensive review of systems was negative except for: Gastrointestinal: positive for diarrhea Musculoskeletal: positive for arthralgias and back pain Behavioral/Psych: negative for depression  Physical exam BP 135/75    Pulse 75    Temp (!) 97.1 F (36.2 C)    Ht '5\' 5"'  (1.651 m)    Wt 237 lb 6.4 oz (107.7 kg)    SpO2 93%    BMI 39.51 kg/m  General appearance: alert, cooperative, appears stated age, and morbidly obese Head: Normocephalic, without obvious abnormality, atraumatic Eyes: negative findings: lids and lashes normal, conjunctivae and sclerae normal, corneas clear, and pupils equal, round, reactive to light and accomodation Ears: normal TM's and external ear canals both ears Nose: Nares  normal. Septum midline. Mucosa normal. No drainage or sinus tenderness. Throat: lips, mucosa, and tongue normal; teeth and gums normal Neck: no adenopathy, no carotid bruit, supple, symmetrical, trachea midline, and thyroid not enlarged, symmetric, no tenderness/mass/nodules Back: symmetric, no curvature. ROM normal. No CVA tenderness. Lungs: clear to auscultation bilaterally Heart: regular rate and rhythm, S1, S2 normal, no murmur, click, rub or gallop Abdomen: soft, non-tender; bowel sounds normal; no masses,  no organomegaly Extremities:  Warm, well perfused.  Trace pitting edema present Pulses: 2+ and symmetric Skin: Skin color, texture, turgor normal. No rashes or lesions Lymph nodes: Cervical, supraclavicular, and axillary nodes normal. Neurologic: Grossly normal MSK: Very antalgic gait.  Requires walker for ambulation. Psych: Mood stable, speech normal, affect appropriate  Depression screen [facility] 2/9 08/14/2021 02/08/2021 08/24/2020  Decreased Interest 0 0 0  Down, Depressed, Hopeless 0 1 0  PHQ - 2 Score 0 1 0  Altered sleeping - - -  Tired, decreased energy - - -  Change in appetite - - -  Feeling bad or failure about yourself  - - -  Trouble concentrating - - -  Moving slowly or fidgety/restless - - -  Suicidal thoughts - - -  PHQ-9 Score - - -  Difficult doing work/chores - - -  Some recent data might be hidden   GAD 7 : Generalized Anxiety Score 08/14/2021 02/08/2021 02/08/2021 11/18/2017  Nervous, Anxious, on Edge 0 0 1 0  Control/stop worrying 0 - 0 0  Worry too much - different things 0 - 0 0  Trouble relaxing 0 - 0 0  Restless 0 - 0 0  Easily annoyed or irritable 0 - 1 0  Afraid - awful might happen 0 - 0 0  Total GAD 7 Score 0 - 2 0  Anxiety Difficulty Not difficult at all - Not difficult at all Not difficult at all   Assessment/ Plan: Karren Cobble here for annual physical exam.   Annual physical exam  Osteoporosis without current pathological fracture,  unspecified osteoporosis type - Plan: denosumab (PROLIA) injection 60 mg, CMP14+EGFR, VITAMIN D 25 Hydroxy (Vit-D Deficiency, Fractures)  Stage 4  chronic kidney disease ([place]) - Plan: CMP14+EGFR, VITAMIN D 25 Hydroxy (Vit-D Deficiency, Fractures), CANCELED: CBC  Hypothyroidism, unspecified type - Plan: TSH, T4, Free  Morbid obesity (HCC) - Plan: Bayer DCA Hb A1c Waived  Chronic bilateral low back pain with sciatica, sciatica laterality unspecified  Gastroesophageal reflux disease without esophagitis - Plan: pantoprazole (PROTONIX) 40 MG tablet  Up-to-date on most things except for her DEXA scan.  Unfortunately x-ray was not available today to obtain.  She was given her Prolia injection and we will collect calcium, renal function, vitamin D level.  Labs to be CCed to nephrology and endocrinology.  Additionally, I have adjusted her Cymbalta to 60 mg daily only.  Max recommended dose based on renal function is 60 mg/day and really we can get her down to 30 mg/day that would be more ideal given her waxing and waning between CKD 3 and 4.  I have collected A1c which did not demonstrate any evidence of diabetes at this time.  We did discuss possible treatment plan for morbid obesity including Wegovy versus Saxenda.  She will check with insurance to see if this is covered.  No apparent contraindications to use  Continue follow-up with specialist for chronic back pain.  She voiced no concerning symptoms or signs with relation to the chronic use of her opioid  GERD is stable with PPI.  This was renewed  Counseled on healthy lifestyle choices, including diet (rich in fruits, vegetables and lean meats and low in salt and simple carbohydrates) and exercise (at least 30 minutes of moderate physical activity daily).  Meds ordered this encounter  Medications   denosumab (PROLIA) injection 60 mg    Order Specific Question:   Patient is enrolled in [facility] program for this medication and I have provided a copy  of the Prolia Medication Guide and Patient Brochure.    Answer:   No    Order Specific Question:   I have reviewed with the patient the information in the Prolia Medication Guide and Patient Counseling Chart including the serious risks of Prolia and symptoms of each risk.    Answer:   Yes    Order Specific Question:   I have advised the patient to seek medical attention if they have signs or symptoms of any of the serious risks.    Answer:   Yes   pantoprazole (PROTONIX) 40 MG tablet    Sig: Take 1 tablet (40 mg total) by mouth daily.    Dispense:  90 tablet    Refill:  3   DULoxetine (CYMBALTA) 60 MG capsule    Sig: Take 1 capsule (60 mg total) by mouth daily.    Dispense:  90 capsule    Refill:  3    Lynia Landry M. Lajuana Ripple, DO

## 2021-08-14 NOTE — Patient Instructions (Signed)
See if your plan covers Wegovy (once weekly injection for weight loss) or Saxenda (once daily)  You had labs performed today.  You will be contacted with the results of the labs once they are available, usually in the next 3 business days for routine lab work.  If you have an active my chart account, they will be released to your MyChart.  If you prefer to have these labs released to you via telephone, please let us know.  If you had a pap smear or biopsy performed, expect to be contacted in about 7-10 days.

## 2021-08-15 LAB — CMP14+EGFR
ALT: 11 IU/L (ref 0–32)
AST: 24 IU/L (ref 0–40)
Albumin/Globulin Ratio: 1.7 (ref 1.2–2.2)
Albumin: 4 g/dL (ref 3.7–4.7)
Alkaline Phosphatase: 47 IU/L (ref 44–121)
BUN/Creatinine Ratio: 17 (ref 12–28)
BUN: 34 mg/dL — ABNORMAL HIGH (ref 8–27)
Bilirubin Total: 0.5 mg/dL (ref 0.0–1.2)
CO2: 27 mmol/L (ref 20–29)
Calcium: 10 mg/dL (ref 8.7–10.3)
Chloride: 105 mmol/L (ref 96–106)
Creatinine, Ser: 2.06 mg/dL — ABNORMAL HIGH (ref 0.57–1.00)
Globulin, Total: 2.3 g/dL (ref 1.5–4.5)
Glucose: 114 mg/dL — ABNORMAL HIGH (ref 70–99)
Potassium: 4.9 mmol/L (ref 3.5–5.2)
Sodium: 148 mmol/L — ABNORMAL HIGH (ref 134–144)
Total Protein: 6.3 g/dL (ref 6.0–8.5)
eGFR: 25 mL/min/{1.73_m2} — ABNORMAL LOW (ref >59)

## 2021-08-15 LAB — VITAMIN D 25 HYDROXY (VIT D DEFICIENCY, FRACTURES): Vit D, 25-Hydroxy: 33.9 ng/mL (ref 30.0–100.0)

## 2021-08-15 LAB — T4, FREE: Free T4: 1.7 ng/dL (ref 0.82–1.77)

## 2021-08-15 LAB — TSH: TSH: 2.34 u[IU]/mL (ref 0.450–4.500)

## 2021-08-21 ENCOUNTER — Encounter: Payer: Self-pay | Admitting: Cardiovascular Disease

## 2021-08-21 ENCOUNTER — Other Ambulatory Visit: Payer: Self-pay

## 2021-08-21 ENCOUNTER — Ambulatory Visit (INDEPENDENT_AMBULATORY_CARE_PROVIDER_SITE_OTHER): Payer: Medicare Other | Admitting: Cardiovascular Disease

## 2021-08-21 VITALS — BP 138/75 | HR 63 | Ht 65.0 in | Wt 235.8 lb

## 2021-08-21 DIAGNOSIS — R600 Localized edema: Secondary | ICD-10-CM

## 2021-08-21 DIAGNOSIS — F419 Anxiety disorder, unspecified: Secondary | ICD-10-CM | POA: Diagnosis not present

## 2021-08-21 DIAGNOSIS — N184 Chronic kidney disease, stage 4 (severe): Secondary | ICD-10-CM

## 2021-08-21 DIAGNOSIS — I10 Essential (primary) hypertension: Secondary | ICD-10-CM

## 2021-08-21 DIAGNOSIS — E039 Hypothyroidism, unspecified: Secondary | ICD-10-CM | POA: Diagnosis not present

## 2021-08-21 DIAGNOSIS — F32A Depression, unspecified: Secondary | ICD-10-CM

## 2021-08-21 DIAGNOSIS — E782 Mixed hyperlipidemia: Secondary | ICD-10-CM | POA: Diagnosis not present

## 2021-08-21 NOTE — Progress Notes (Signed)
Patient ID: Regina Horton, female   DOB: Jul 25, 1951, 71 y.o.   MRN: [id number]      Primary MD:  Dr. Tula Nakayama  HPI: Regina Horton is a 71 y.o. female who presents to the office today for an 8 month follow up cardiology evaluation.  Regina Horton has a history of palpitations which have been controlled with beta blocker therapy, hypertension, mixed hyperlipidemia, obesity, mild renal insufficiency, and chronic low back pain.  An echo Doppler study in 2010  showed mild aortic sclerosis without stenosis, mild mitral and calcification with mild MR, and mild TR, and normal systolic function.  A nuclear perfusion study showed normal perfusion.  She has a significant history of depression and takes Cymbalta, both for depression, as well as for chronic low back pain.  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  She walks with a cane.  Her low back pain has been debilitating.  She is follow-up Dr. Birdie Sons for renal insufficiency. She has had issues with chronic pain in particular back pain.  She has seen Dr. Jeanell Sparrow most for pain management.  She recently stopped taking her pain medications due to concerns at the were adversely affecting her.  She continues to have issues with depression and cries often, because her back hurts.  She also has noticed some palpitations.    When I saw her in January 2018, she  continued to have low back issues and was being evaluated by Dr. Ellene Route.  She is been evaluated by Dr. Ellene Route.She does experience frequent ankle swelling almost daily and occasionally this is more pronounced.  She denies any episodes of chest pain, palpitations, presyncope or syncope.  On 05/19/2017: Creatinine was 1.5.  Hemoglobin 13.6.  Total cholesterol was 150, HDL 57, LDL 57, and triglycerides are mildly increased at 179.  She walks with a walker primarily due to knee discomfort.    When I   saw her in June 2019 she was feeling well on low-dose hydrochlorothiazide and had improvement in her ankle edema.  Laboratory tended to show stable renal function.  I saw her in December 2019 at which time she continued to feel well.  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  She has experienced rare nonexertional episodes of chest pain which are atypical and not representative of angina.  Her last creatinine in July 2019 was 1.7.  Lipid studies revealed total cholesterol 142, HDL 65, LDL 53, and triglycerides 118.  She was followed by Dr. Lowanda Foster with reference to her renal disease.    I evaluated her in a telemedicine visit in November 2020.  Dr. Lowanda Foster had retired and she was establishing care with his new partner Dr. Theador Hawthorne.  She sees him every 2 to 3 months.  Since her last evaluation with me she has seen Dr. Renato Shin for endocrinology care as well as Dr. Adam Phenix for primary care, being last seen April 2021.  When I last saw her, she was on increased levothyroxine dose at 75 mcg.  She denied any chest pain.  There noted some mild shortness of breath with activity but she is not very active.  She denies palpitations.  She continues to have chronic back pain and has degenerative disc disease followed by Dr. Ellene Route involving 7 discs.  She notes occasional swelling.    I last  saw her in May 2022.  She has been followed by Dr. Theador Hawthorne of nephrology in [place].  She has stage IV CKD and most recent laboratory in September 2021 showed a creatinine of 2.13 with a GFR of 23.  She has continued to have episodic leg swelling and does wear support stockings intermittently.  She is on diltiazem 120 mg, furosemide 40 mg daily, and was recently started on low-dose losartan 12.5 mg.  She is scheduled with for follow-up laboratory with Dr. Theador Hawthorne.  She has hypothyroidism and is on levothyroxine at 88 mcg.  She has been  on atorvastatin 40 mg, fenofibrate 160 mg, and omega-3 fatty acid lovaza 2 capsules twice a day.  This had resulted in stabilization of her triglycerides.  Her last lipid study in April 2021 showed an LDL cholesterol at 55 with triglycerides 117, HDL 60 total cholesterol 136.  She was  told by her insurance company that she will need to get approval for her lovaza.  She denied any chest pain PND orthopnea or recent shortness of breath.    Since I last saw her, she states that she had developed progressive lower extremity edema and recently lost over 20 pounds of fluid from her legs.  She continues to be followed by Dr. Theador Hawthorne.  She has been followed by Dr. Lajuana Ripple at [facility] for primary care.  She denies any chest pain or significant shortness of breath.  She continues to be on diltiazem CD1 120 mg, and has recently been taking furosemide 40 mg every morning, losartan 12.5 mg daily, metoprolol succinate 100 mg in the morning and 50 mg at night.  She is on levothyroxine 88 mcg for hypothyroidism.  She has depression on Cymbalta, and neuropathy on gabapentin.  She also takes Prolia for osteoporosis injection every 6 months.  She continues to be on atorvastatin 40 mg for hyperlipidemia and pantoprazole for reflux.  She presents for reevaluation.  Past Medical History:  Diagnosis Date   Allergic rhinitis    Angina at rest [facility])    Anxiety    Aortic sclerosis    Cataract 2016   Chronic back pain    Chronic constipation    CKD (chronic kidney disease)    Depression    Fibromyalgia    GERD (gastroesophageal reflux disease)    Hyperlipidemia    Hypertension    Hypertension    Mitral regurgitation    Osteoporosis    Palpitations    Thyroid disease    Tricuspid regurgitation     Past Surgical History:  Procedure Laterality Date   BACK SURGERY  2005   BREAST LUMPECTOMY  1970   right    CATARACT EXTRACTION W/PHACO Right 06/15/2015   Procedure: CATARACT EXTRACTION PHACO AND  INTRAOCULAR LENS PLACEMENT ([place]);  Surgeon: Tonny Branch, MD;  Location: [facility];  Service: Ophthalmology;  Laterality: Right;  CDE: 10.33   CATARACT EXTRACTION W/PHACO Left 07/17/2015   Procedure: CATARACT EXTRACTION PHACO AND INTRAOCULAR LENS PLACEMENT ([facility]);  Surgeon: Tonny Branch, MD;  Location: [facility];  Service: Ophthalmology;  Laterality: Left;  CDE:7.60   NM MYOCAR PERF WALL MOTION  02/16/2009   normal   ORIF ANKLE FRACTURE Left 10/26/2014   Procedure: OPEN REDUCTION INTERNAL FIXATION (ORIF) ANKLE FRACTURE;  Surgeon: Sanjuana Kava, MD;  Location: [facility];  Service: Orthopedics;  Laterality: Left;   TONSILLECTOMY  childhood    TUBAL LIGATION  1973   US ECHOCARDIOGRAPHY  12/19/2008   mild mitral annular ca+,AOV mildly sclerotic,mild MR &  TR    Allergies  Allergen Reactions   Levocetirizine Hives and Itching   Xyzal [Levocetirizine Dihydrochloride] Hives and Itching   Codeine     Pt states she does not have any problems with codeine.   Nickel Rash    Current Outpatient Medications  Medication Sig Dispense Refill   atorvastatin (LIPITOR) 40 MG tablet TAKE 1 TABLET (40 MG TOTAL) BY MOUTH DAILY. 90 tablet 2   Carboxymethylcellul-Glycerin (REFRESH RELIEVA OP) Apply 1 drop to eye. 6 times per day     denosumab (PROLIA) 60 MG/ML SOSY injection Inject 60 mg into the skin every 6 (six) months.     diltiazem (CARDIZEM CD) 120 MG 24 hr capsule Take 120 mg by mouth daily.     Docusate Calcium (STOOL SOFTENER PO) Take by mouth.     DULoxetine (CYMBALTA) 60 MG capsule Take 1 capsule (60 mg total) by mouth daily. 90 capsule 3   fenofibrate 160 MG tablet TAKE 1 TABLET EVERY DAY 90 tablet 2   fluticasone (FLONASE) 50 MCG/ACT nasal spray USE 2 SPRAYS IN EACH NOSTRIL EVERY DAY 48 g 1   furosemide (LASIX) 20 MG tablet Take 24m each morning,  240meach evening 90 tablet 1   gabapentin (NEURONTIN) 600 MG tablet Take 1/2 tab every morning and 1 tablet every evening 135 tablet 3   hydrOXYzine (ATARAX/VISTARIL)  25 MG tablet Take 25-50 mg by mouth at bedtime.     levothyroxine (SYNTHROID) 88 MCG tablet TAKE 1 TABLET (88 MCG TOTAL) BY MOUTH DAILY. 90 tablet 3   losartan (COZAAR) 25 MG tablet Take 12.5 mg by mouth daily.     methocarbamol (ROBAXIN) 500 MG tablet Take 500 mg by mouth 2 (two) times daily as needed.     metoprolol succinate (TOPROL-XL) 100 MG 24 hr tablet TAKE 1 TABLET EVERY MORNING AND TAKE 1/2 TABLET EVERY EVENING. 135 tablet 1   oxyCODONE-acetaminophen (PERCOCET) 7.5-325 MG tablet Take 1 tablet by mouth every 8 (eight) hours as needed.     pantoprazole (PROTONIX) 40 MG tablet Take 1 tablet (40 mg total) by mouth daily. 90 tablet 3   No current facility-administered medications for this visit.    Social History   Socioeconomic History   Marital status: Married    Spouse name: Regina Horton Number of children: 2   Years of education: 12   Highest education level: High school graduate  Occupational History   Occupation: disabled   Tobacco Use   Smoking status: Former    Packs/day: 1.00    Years: 20.00    Pack years: 20.00    Types: Cigarettes    Quit date: 12/24/2003    Years since quitting: 17.6   Smokeless tobacco: Never  Vaping Use   Vaping Use: Never used  Substance and Sexual Activity   Alcohol use: No   Drug use: Not Currently    Types: Marijuana    Comment: boils marijuana in water and makes a tea about 3 times a week   Sexual activity: Not Currently    Birth control/protection: Surgical  Other Topics Concern   Not on file  Social History Narrative   Not on file   Social Determinants of Health   Financial Resource Strain: Low Risk    Difficulty of Paying Living Expenses: Not hard at all  Food Insecurity: No Food Insecurity   Worried About RuCharity fundraisern the Last Year: Never true   [place] the Last Year:  Never true  Transportation Needs: No Transportation Needs   Lack of Transportation (Medical): No   Lack of Transportation (Non-Medical): No   Physical Activity: Inactive   Days of Exercise per Week: 0 days   Minutes of Exercise per Session: 0 min  Stress: No Stress Concern Present   Feeling of Stress : Not at all  Social Connections: Moderately Integrated   Frequency of Communication with Friends and Family: More than three times a week   Frequency of Social Gatherings with Friends and Family: More than three times a week   Attends Religious Services: 1 to 4 times per year   Active Member of [employer] or Organizations: No   Attends Music therapist: Never   Marital Status: Married  Human resources officer Violence: Not At Risk   Fear of Current or Ex-Partner: No   Emotionally Abused: No   Physically Abused: No   Sexually Abused: No    Family History  Problem Relation Age of Onset   Rheum arthritis Mother    Osteoporosis Mother    Heart disease Father    Diabetes Father    Hyperlipidemia Father    Bladder Cancer Father    Heart disease Brother    Arthritis Brother     ROS General: Negative; No fevers, chills, or night sweats HEENT: Negative; No changes in vision or hearing, sinus congestion, difficulty swallowing Pulmonary: Negative; No cough, wheezing, shortness of breath, hemoptysis Cardiovascular: See HPI: No chest pain, presyncope, syncope, palpitations Positive for occasional ankle swelling GI: Positive for GERD. No nausea, vomiting, diarrhea, or abdominal pain GU: Negative; No dysuria, hematuria, or difficulty voiding Musculoskeletal: Positive for chronic low back pain and knee discomfort Hematologic: Negative; no easy bruising, bleeding Endocrine: Positive for hypothyroidism.  No diabetes Neuro: Negative; no changes in balance, headaches Skin: Negative; No rashes or skin lesions Psychiatric: Positive for depression and anxiety Sleep: Negative; No snoring,  daytime sleepiness, hypersomnolence, bruxism, restless legs, hypnogognic hallucinations. Other comprehensive 14 point system review is  negative   Physical Exam BP 138/75    Pulse 63    Ht _0  (1.651 m)    Wt 235 lb 12.8 oz (107 kg)    SpO2 95%    BMI 39.24 kg/m    Repeat blood pressure by me was 124/74  Wt Readings from Last 3 Encounters:  08/21/21 235 lb 12.8 oz (107 kg)  08/14/21 237 lb 6.4 oz (107.7 kg)  02/08/21 244 lb 12.8 oz (111 kg)    General: Alert, oriented, no distress.  Skin: normal turgor, no rashes, warm and dry HEENT: Normocephalic, atraumatic. Pupils equal round and reactive to light; sclera anicteric; extraocular muscles intact;  Nose without nasal septal hypertrophy Mouth/Parynx benign; Mallinpatti scale 3 Neck: No JVD, no carotid bruits; normal carotid upstroke Lungs: clear to ausculatation and percussion; no wheezing or rales Chest wall: without tenderness to palpitation Heart: PMI not displaced, RRR, s1 s2 normal, 1/6 systolic murmur, no diastolic murmur, no rubs, gallops, thrills, or heaves Abdomen: soft, nontender; no hepatosplenomehaly, BS+; abdominal aorta nontender and not dilated by palpation. Back: no CVA tenderness Pulses 2+ Musculoskeletal: full range of motion, normal strength, no joint deformities Extremities: Trivial if any residual edema; no clubbing cyanosis, Homan's sign negative  Neurologic: grossly nonfocal; Cranial nerves grossly wnl Psychologic: Normal mood and affect   August 21, 2021 ECG (independently read by me):  NSR at 63, IRBBB, Inferolateral ST changes   Dec 21, 2020  ECG (independently read by me): NSR at  66; IRBBB, nonspecific T wave abnormality  May 2021 ECG (independently read by me): NSR with mild sinus arrythmia; normal intervals, NST change; IRBBB  July 28, 2018 ECG (independently read by me): Normal sinus rhythm at 65 bpm.  Incomplete right bundle branch block.  Anterior nonspecific ST-T changes  January 28, 2018 ECG (independently read by me): Sinus rhythm at 68 bpm.  Nonspecific ST-T changes.  Incomplete right bundle branch block.  July 17, 2017 ECG (independently read by me): Sinus bradycardia 55 bpm.  Nonspecific ST changes.  Normal intervals.  No ectopy.  09/17/2016 ECG (independently read by me): Sinus rhythm with incomplete right bundle branch block.  Nonspecific ST-T changes.  QTc interval 431 Regina, PR interval 156 Regina.  September 14 2015 ECG (independently read by me):  Sinus bradycardia 57 bpm.  Incomplete right bundle branch block.  September 2015ECG (independently read by me): Normal sinus rhythm with incomplete right bundle branch block.  LABS:  BMP Latest Ref Rng & Units 08/14/2021 12/15/2019 03/05/2018  Glucose 70 - 99 mg/dL 114(H) 113(H) 98  BUN 8 - 27 mg/dL 34(H) 28(H) 28(H)  Creatinine 0.57 - 1.00 mg/dL 2.06(H) 1.91(H) 1.79(H)  BUN/Creat Ratio 12 - _0 Sodium 134 - 144 mmol/L 148(H) 145(H) 144  Potassium 3.5 - 5.2 mmol/L 4.9 4.4 5.3(H)  Chloride 96 - 106 mmol/L 105 106 104  CO2 20 - 29 mmol/L _1 Calcium 8.7 - 10.3 mg/dL 10.0 10.4(H) 10.2   Hepatic Function Latest Ref Rng & Units 08/14/2021 12/15/2019 03/05/2018  Total Protein 6.0 - 8.5 g/dL 6.3 6.9 6.9  Albumin 3.7 - 4.7 g/dL 4.0 4.5 4.6  AST 0 - 40 IU/L _2 ALT 0 - 32 IU/L _3 Alk Phosphatase 44 - 121 IU/L 47 52 47  Total Bilirubin 0.0 - 1.2 mg/dL 0.5 0.6 0.4  Bilirubin, Direct 0.0 - 0.3 mg/dL - - -   CBC Latest Ref Rng & Units 12/15/2019 03/05/2018 09/28/2015  WBC 3.4 - 10.8 x10E3/uL 7.0 7.0 8.2  Hemoglobin 11.1 - 15.9 g/dL 13.8 13.2 12.2  Hematocrit 34.0 - 46.6 % 40.2 41.3 36.7  Platelets 150 - 450 x10E3/uL 334 380 417(H)   Lab Results  Component Value Date   MCV 93 12/15/2019   MCV 93 03/05/2018   MCV 92 09/28/2015   Lab Results  Component Value Date   TSH 2.340 08/14/2021   Lab Results  Component Value Date   HGBA1C 4.8 08/14/2021   Lipid Panel     Component Value Date/Time   CHOL 156 01/04/2021 1551   TRIG 156 (H) 01/04/2021 1551   HDL 58 01/04/2021 1551   CHOLHDL 2.7 01/04/2021 1551   CHOLHDL 3.0 05/12/2015  1459   VLDL 30 05/12/2015 1459   LDLCALC 72 01/04/2021 1551    RADIOLOGY: No results found.  IMPRESSION: 1. Essential hypertension   2. CKD (chronic kidney disease) stage 4, GFR 15-29 ml/min (HCC)   3. Mixed hyperlipidemia   4. Anxiety and depression   5. Hypothyroidism, unspecified type   6. Lower extremity edema     ASSESSMENT AND PLAN: Regina Horton is a 71year-old female who has a history of depression treated with Cymbalta 60 mg daily.  In the past she had frequent crying spells but these have improved.  She has stage IV CKD and is followed by Dr. Theador Hawthorne in [place].  She has microalbuminuria and has been treated with low-dose losartan 12.5 mg.  She tells me she recently had developed progressive lower extremity edema necessitating increase in her diuretic regimen but she admits to a recent 20 pound loss of fluid.  On exam today there is no significant residual edema.  Her blood pressure on repeat by me was stable at 121/74 on a regimen consisting of diltiazem CD1 120 mg, furosemide 40 mg now only in the morning, losartan 12.5 mg daily, and metoprolol succinate 100 mg in the morning and 50 mg at night.  She is not having any chest pain.  There is no PND orthopnea.  She is on levothyroxine 88 mcg for hypothyroidism.  She is on atorvastatin 40 mg for hyperlipidemia.  He tells me she recently had lab work last week which I do not have available.  She is on gabapentin for neuropathy, pantoprazole for GERD, and takes Prolia injection every 6 months for osteoporosis.  Clinically she is relatively stable.  She will be following up with Dr. Loanne Drilling in February for endocrinologic reevaluation.  I will see her in 1 year for reevaluation or sooner as needed.    Troy Sine, MD, [facility]  08/25/2021 11:15 AM

## 2021-08-21 NOTE — Patient Instructions (Signed)
Medication Instructions:  The current medical regimen is effective;  continue present plan and medications as directed. Please refer to the Current Medication list given to you today.   *If you need a refill on your cardiac medications before your next appointment, please call your pharmacy*  Lab Work:   Testing/Procedures:  NONE    NONE  Special Instructions   Follow-Up: Your next appointment:  12 month(s) In Person with Shelva Majestic, MD   Please call our office 2 months in advance to schedule this appointment   At [facility], you and your health needs are our priority.  As part of our continuing mission to provide you with exceptional heart care, we have created designated Provider Care Teams.  These Care Teams include your primary Cardiologist (physician) and Advanced Practice Providers (APPs -  Physician Assistants and Nurse Practitioners) who all work together to provide you with the care you need, when you need it.

## 2021-08-25 ENCOUNTER — Encounter: Payer: Self-pay | Admitting: Cardiovascular Disease

## 2021-08-27 ENCOUNTER — Ambulatory Visit (INDEPENDENT_AMBULATORY_CARE_PROVIDER_SITE_OTHER): Payer: Medicare Other

## 2021-08-27 VITALS — Ht 65.0 in | Wt 237.0 lb

## 2021-08-27 DIAGNOSIS — Z1211 Encounter for screening for malignant neoplasm of colon: Secondary | ICD-10-CM | POA: Diagnosis not present

## 2021-08-27 DIAGNOSIS — Z0000 Encounter for general adult medical examination without abnormal findings: Secondary | ICD-10-CM | POA: Diagnosis not present

## 2021-08-27 DIAGNOSIS — I519 Heart disease, unspecified: Secondary | ICD-10-CM | POA: Insufficient documentation

## 2021-08-27 NOTE — Progress Notes (Signed)
Subjective:   Regina Horton is a 71 y.o. female who presents for Medicare Annual (Subsequent) preventive examination.  Virtual Visit via Telephone Note  I connected with  Regina Horton on 08/27/21 at  2:45 PM EST by telephone and verified that I am speaking with the correct person using two identifiers.  Location: Patient: Home Provider: [facility] Persons participating in the virtual visit: patient/Nurse Health Advisor   I discussed the limitations, risks, security and privacy concerns of performing an evaluation and management service by telephone and the availability of in person appointments. The patient expressed understanding and agreed to proceed.  Interactive audio and video telecommunications were attempted between this nurse and patient, however failed, due to patient having technical difficulties OR patient did not have access to video capability.  We continued and completed visit with audio only.  Some vital signs may be absent or patient reported.   Tniyah Nakagawa E Vishnu Moeller, LPN   Review of Systems     Cardiac Risk Factors include: advanced age (>57men, >7 women);sedentary lifestyle;obesity (BMI >30kg/m2);dyslipidemia;hypertension     Objective:    Today's Vitals   08/27/21 1448  Weight: 237 lb (107.5 kg)  Height: 5\' 5"  (1.651 m)  PainSc: 5    Body mass index is 39.44 kg/m.  Advanced Directives 08/27/2021 08/24/2020 08/07/2017 01/10/2017 12/23/2016 07/17/2015 06/09/2015  Does Patient Have a Medical Advance Directive? No No No No No No Yes  Does patient want to make changes to medical advance directive? - - - - - - No - Patient declined  Copy of [place] in Chart? - - - - - - No - copy requested  Would patient like information on creating a medical advance directive? No - Patient declined No - Patient declined Yes (MAU/Ambulatory/Procedural Areas - Information given) - Yes (MAU/Ambulatory/Procedural Areas - Information given) Yes - Educational materials given  -    Current Medications (verified) Outpatient Encounter Medications as of 08/27/2021  Medication Sig   atorvastatin (LIPITOR) 40 MG tablet TAKE 1 TABLET (40 MG TOTAL) BY MOUTH DAILY.   Carboxymethylcellul-Glycerin (REFRESH RELIEVA OP) Apply 1 drop to eye. 6 times per day   denosumab (PROLIA) 60 MG/ML SOSY injection Inject 60 mg into the skin every 6 (six) months.   diltiazem (CARDIZEM CD) 120 MG 24 hr capsule Take 120 mg by mouth daily.   Docusate Calcium (STOOL SOFTENER PO) Take by mouth.   DULoxetine (CYMBALTA) 60 MG capsule Take 1 capsule (60 mg total) by mouth daily.   fenofibrate 160 MG tablet TAKE 1 TABLET EVERY DAY   fluticasone (FLONASE) 50 MCG/ACT nasal spray USE 2 SPRAYS IN EACH NOSTRIL EVERY DAY   furosemide (LASIX) 20 MG tablet Take 40mg  each morning,  20mg  each evening   gabapentin (NEURONTIN) 600 MG tablet Take 1/2 tab every morning and 1 tablet every evening   hydrOXYzine (ATARAX/VISTARIL) 25 MG tablet Take 25-50 mg by mouth at bedtime.   levothyroxine (SYNTHROID) 88 MCG tablet TAKE 1 TABLET (88 MCG TOTAL) BY MOUTH DAILY.   losartan (COZAAR) 25 MG tablet Take 12.5 mg by mouth daily.   methocarbamol (ROBAXIN) 500 MG tablet Take 500 mg by mouth 2 (two) times daily as needed.   metoprolol succinate (TOPROL-XL) 100 MG 24 hr tablet TAKE 1 TABLET EVERY MORNING AND TAKE 1/2 TABLET EVERY EVENING.   oxyCODONE-acetaminophen (PERCOCET) 7.5-325 MG tablet Take 1 tablet by mouth every 8 (eight) hours as needed.   pantoprazole (PROTONIX) 40 MG tablet Take 1 tablet (40  mg total) by mouth daily.   No facility-administered encounter medications on file as of 08/27/2021.    Allergies (verified) Levocetirizine, Xyzal [levocetirizine dihydrochloride], Codeine, and Nickel   History: Past Medical History:  Diagnosis Date   Allergic rhinitis    Allergy Dont remember   Nickel ears pierced when I was 14    Sinus meds 2017   Angina at rest [facility])    Anxiety    Aortic sclerosis    Arthritis  Many years ago   Cataract 2016   CHF (congestive heart failure) ([place]) 2020   Dr Claiborne Billings found it.   Chronic back pain    Chronic constipation    CKD (chronic kidney disease)    Depression    Fibromyalgia    GERD (gastroesophageal reflux disease)    Hyperlipidemia    Hypertension    Hypertension    Mitral regurgitation    Osteoporosis    Palpitations    Thyroid disease    Tricuspid regurgitation    Past Surgical History:  Procedure Laterality Date   BACK SURGERY  08/20/2003   BREAST LUMPECTOMY  08/19/1968   right    CATARACT EXTRACTION W/PHACO Right 06/15/2015   Procedure: CATARACT EXTRACTION PHACO AND INTRAOCULAR LENS PLACEMENT ([place]);  Surgeon: Tonny Branch, MD;  Location: [facility];  Service: Ophthalmology;  Laterality: Right;  CDE: 10.33   CATARACT EXTRACTION W/PHACO Left 07/17/2015   Procedure: CATARACT EXTRACTION PHACO AND INTRAOCULAR LENS PLACEMENT ([facility]);  Surgeon: Tonny Branch, MD;  Location: [facility];  Service: Ophthalmology;  Laterality: Left;  CDE:7.60   EYE SURGERY  2018   Cataracts   FRACTURE SURGERY  2016   Left ankle.   NM MYOCAR PERF WALL MOTION  02/16/2009   normal   ORIF ANKLE FRACTURE Left 10/26/2014   Procedure: OPEN REDUCTION INTERNAL FIXATION (ORIF) ANKLE FRACTURE;  Surgeon: Sanjuana Kava, MD;  Location: [facility];  Service: Orthopedics;  Laterality: Left;   SPINE SURGERY  2005   Fusion with hardware   TONSILLECTOMY  childhood    TUBAL LIGATION  08/20/1971   US ECHOCARDIOGRAPHY  12/19/2008   mild mitral annular ca+,AOV mildly sclerotic,mild MR & TR   Family History  Problem Relation Age of Onset   Rheum arthritis Mother    Osteoporosis Mother    Arthritis Mother    Depression Mother    Varicose Veins Mother    Heart disease Father    Diabetes Father    Hyperlipidemia Father    Bladder Cancer Father    Cancer Father    Depression Father    Hypertension Father    Obesity Father    Heart disease Brother    Arthritis Brother    Alcohol abuse Brother     Drug abuse Brother    Depression Maternal Grandmother    Social History   Socioeconomic History   Marital status: Married    Spouse name: Minerva Fester   Number of children: 2   Years of education: 12   Highest education level: High school graduate  Occupational History   Occupation: disabled   Tobacco Use   Smoking status: Former    Packs/day: 1.00    Years: 20.00    Pack years: 20.00    Types: Cigarettes    Quit date: 12/24/2003    Years since quitting: 17.6   Smokeless tobacco: Never   Tobacco comments:    Stopped on May 07,2005 before back surgery. Haven't smoked sence.  Vaping Use   Vaping Use: Never used  Substance and Sexual Activity   Alcohol use: No   Drug use: Not Currently    Frequency: 1.0 times per week    Types: Marijuana    Comment: boils marijuana in water and makes a tea about 3 times a week   Sexual activity: Not Currently    Birth control/protection: Surgical    Comment: Tubes tied 1973  Other Topics Concern   Not on file  Social History Narrative   Not on file   Social Determinants of Health   Financial Resource Strain: Medium Risk   Difficulty of Paying Living Expenses: Somewhat hard  Food Insecurity: Food Insecurity Present   Worried About Running Out of Food in the Last Year: Sometimes true   Ran Out of Food in the Last Year: Sometimes true  Transportation Needs: No Transportation Needs   Lack of Transportation (Medical): No   Lack of Transportation (Non-Medical): No  Physical Activity: Insufficiently Active   Days of Exercise per Week: 5 days   Minutes of Exercise per Session: 10 min  Stress: No Stress Concern Present   Feeling of Stress : Only a little  Social Connections: Moderately Isolated   Frequency of Communication with Friends and Family: Once a week   Frequency of Social Gatherings with Friends and Family: Once a week   Attends Religious Services: 1 to 4 times per year   Active Member of [employer] or Organizations: No   Attends Theatre manager Meetings: Never   Marital Status: Married    Tobacco Counseling Counseling given: Not Answered Tobacco comments: Stopped on May 07,2005 before back surgery. Haven't smoked sence.   Clinical Intake:  Pre-visit preparation completed: Yes  Pain : 0-10 Pain Score: 5  Pain Type: Chronic pain Pain Location: Back Pain Orientation: Lower Pain Descriptors / Indicators: Aching, Discomfort, Sore Pain Onset: More than a month ago Pain Frequency: Intermittent     BMI - recorded: 39.44 Nutritional Status: BMI > 30  Obese Diabetes: No  How often do you need to have someone help you when you read instructions, pamphlets, or other written materials from your doctor or pharmacy?: 1 - Never  Diabetic? no  Interpreter Needed?: No  Information entered by :: Mylea Roarty, LPN   Activities of Daily Living In your present state of health, do you have any difficulty performing the following activities: 08/27/2021 08/24/2021  Hearing? N N  Vision? N N  Difficulty concentrating or making decisions? Y Y  Comment sometimes -  Walking or climbing stairs? Y Y  Comment can only do 3-4, then hurts back too bad to continue -  Dressing or bathing? Tempie Donning  Comment hurts, but she can do it -  Doing errands, shopping? Y Y  Comment can't drive - son and husband drive her -  Conservation officer, nature and eating ? Y Y  Using the Toilet? N N  In the past six months, have you accidently leaked urine? Y Y  Do you have problems with loss of bowel control? N N  Managing your Medications? N N  Managing your Finances? Tempie Donning  Housekeeping or managing your Housekeeping? Tempie Donning  Some recent data might be hidden    Patient Care Team: [name], DO as PCP - General (Family Medicine) Troy Sine, MD as PCP - Cardiology (Cardiology) Rogene Houston, MD as Consulting Physician (Gastroenterology) Jonnie Kind, MD (Inactive) as Consulting Physician (Obstetrics and Gynecology) Renato Shin, MD as  Consulting Physician (Endocrinology) Troy Sine,  MD as Consulting Physician (Cardiology) Fran Lowes, MD (Inactive) as Consulting Physician (Nephrology) Netta Cedars, MD as Consulting Physician (Orthopedic Surgery)  Indicate any recent Medical Services you may have received from other than [facility] providers in the past year (date may be approximate).     Assessment:   This is a routine wellness examination for Gearldene.  Hearing/Vision screen Hearing Screening - Comments:: Denies hearing difficulties  Vision Screening - Comments:: Wears rx glasses - up to date with annual eye exams with [facility]  Dietary issues and exercise activities discussed: Current Exercise Habits: The patient does not participate in regular exercise at present, Exercise limited by: orthopedic condition(s);neurologic condition(s)   Goals Addressed               This Visit's Progress     Exercise 3x per week (30 min per time)   Not on track     Recommend starting a routine exercise program at least 3 days a week for 30-45 minutes at a time as tolerated.        Prevent falls (pt-stated)   Not on track     "I would like to walk more to strengthen my legs so I don't fall"       Depression Screen PHQ 2/9 Scores 08/27/2021 08/14/2021 02/08/2021 08/24/2020 12/15/2019 11/18/2017 08/15/2017  PHQ - 2 Score 0 0 1 0 0 2 1  PHQ- 9 Score - - - - 0 5 -    Fall Risk Fall Risk  08/27/2021 08/24/2021 08/14/2021 02/08/2021 02/08/2021  Falls in the past year? 0 0 0 0 0  Number falls in past yr: 0 0 - - -  Injury with Fall? 0 0 - - -  Risk Factor Category  - - - - -  Risk for fall due to : Impaired balance/gait;Medication side effect;Orthopedic patient - - - -  Follow up Education provided;Falls prevention discussed - - - -    FALL RISK PREVENTION PERTAINING TO THE HOME:  Any stairs in or around the home? Yes  If so, are there any without handrails? No  Home free of loose throw rugs in walkways, pet beds,  electrical cords, etc? Yes  Adequate lighting in your home to reduce risk of falls? Yes   ASSISTIVE DEVICES UTILIZED TO PREVENT FALLS:  Life alert? No  Use of a cane, walker or w/c? Yes  Grab bars in the bathroom? No  Shower chair or bench in shower? Yes  Elevated toilet seat or a handicapped toilet? Yes   TIMED UP AND GO:  Was the test performed? No . Telephonic visit  Cognitive Function: Normal cognitive status assessed by direct observation by this Nurse Health Advisor. No abnormalities found.       6CIT Screen 08/27/2021 08/24/2020 12/23/2016  What Year? 0 points 0 points 0 points  What month? 0 points 0 points 0 points  What time? 0 points 0 points 0 points  Count back from 20 0 points 0 points 0 points  Months in reverse 0 points 0 points 0 points  Repeat phrase 2 points 2 points 0 points  Total Score 2 2 0    Immunizations Immunization History  Administered Date(s) Administered   Influenza Whole 05/02/2010   Influenza, High Dose Seasonal PF 07/17/2017, 08/15/2017, 06/22/2018   Influenza,inj,Quad PF,6+ Mos 07/07/2014, 05/16/2015, 06/12/2016   Influenza-Unspecified 07/18/2017, 07/01/2018, 06/23/2020, 06/20/2021   Moderna Sars-Covid-2 Vaccination 10/01/2019, 10/29/2019   PFIZER(Purple Top)SARS-COV-2 Vaccination 06/22/2020, 11/20/2020, 06/20/2021   Pneumococcal Conjugate-13 01/09/2015  Pneumococcal Polysaccharide-23 08/19/1998, 02/07/2011, 06/12/2016   Td 08/07/2010   Zoster, Live 02/15/2011    TDAP status: Due, Education has been provided regarding the importance of this vaccine. Advised may receive this vaccine at local pharmacy or Health Dept. Aware to provide a copy of the vaccination record if obtained from local pharmacy or Health Dept. Verbalized acceptance and understanding.  Flu Vaccine status: Up to date  Pneumococcal vaccine status: Up to date  Covid-19 vaccine status: Completed vaccines  Qualifies for Shingles Vaccine? Yes   Zostavax completed Yes    Shingrix Completed?: No.    Education has been provided regarding the importance of this vaccine. Patient has been advised to call insurance company to determine out of pocket expense if they have not yet received this vaccine. Advised may also receive vaccine at local pharmacy or Health Dept. Verbalized acceptance and understanding.  Screening Tests Health Maintenance  Topic Date Due   COVID-19 Vaccine (6 - Booster) 08/30/2021 (Originally 08/15/2021)   Zoster Vaccines- Shingrix (1 of 2) 11/12/2021 (Originally 03/14/1970)   COLONOSCOPY (Pts 45-28yrs Insurance coverage will need to be confirmed)  02/08/2022 (Originally 09/19/2013)   TETANUS/TDAP  02/08/2022 (Originally 08/07/2020)   MAMMOGRAM  12/28/2021   Pneumonia Vaccine 42+ Years old  Completed   INFLUENZA VACCINE  Completed   DEXA SCAN  Completed   Hepatitis C Screening  Completed   HPV VACCINES  Aged Out    Health Maintenance  There are no preventive care reminders to display for this patient.  Colorectal cancer screening: Referral to GI placed 08/27/2021. Pt aware the office will call re: appt.  Mammogram status: Completed 12/29/2019. Repeat every year  Bone Density status: Completed 11/19/2017. Results reflect: Bone density results: OSTEOPOROSIS. Repeat every 2 years.  Lung Cancer Screening: (Low Dose CT Chest recommended if Age 21-80 years, 30 pack-year currently smoking OR have quit w/in 15years.) does not qualify.   Additional Screening:  Hepatitis C Screening: does qualify; Completed 09/28/2015  Vision Screening: Recommended annual ophthalmology exams for early detection of glaucoma and other disorders of the eye. Is the patient up to date with their annual eye exam?  Yes  Who is the provider or what is the name of the office in which the patient attends annual eye exams? [place] If pt is not established with a provider, would they like to be referred to a provider to establish care? No .   Dental Screening:  Recommended annual dental exams for proper oral hygiene  Community Resource Referral / Chronic Care Management: CRR required this visit?  No   CCM required this visit?  No      Plan:     I have personally reviewed and noted the following in the patients chart:   Medical and social history Use of alcohol, tobacco or illicit drugs  Current medications and supplements including opioid prescriptions.  Functional ability and status Nutritional status Physical activity Advanced directives List of other physicians Hospitalizations, surgeries, and ER visits in previous 12 months Vitals Screenings to include cognitive, depression, and falls Referrals and appointments  In addition, I have reviewed and discussed with patient certain preventive protocols, quality metrics, and best practice recommendations. A written personalized care plan for preventive services as well as general preventive health recommendations were provided to patient.     Sandrea Hammond, LPN   04/26/9210   Nurse Notes: None

## 2021-08-27 NOTE — Patient Instructions (Signed)
Regina Horton , Thank you for taking time to come for your Medicare Wellness Visit. I appreciate your ongoing commitment to your health goals. Please review the following plan we discussed and let me know if I can assist you in the future.   Screening recommendations/referrals: Colonoscopy: Done 2005 - Repeat every 10 years - sent referral to Dr Laural Golden today Mammogram: Done 12/29/2019 - doesn't want to reschedule right now -bad experience - will let us know if she wants to repeat Bone Density: Done 11/19/2017 - repeat every 2-5 years Recommended yearly ophthalmology/optometry visit for glaucoma screening and checkup Recommended yearly dental visit for hygiene and checkup  Vaccinations: Influenza vaccine: Done 06/20/2021 - Repeat annually  Pneumococcal vaccine: Done 01/09/2015 & 06/12/2016 Tdap vaccine: Done 08/07/2010 - Repeat in 10 years *due Shingles vaccine: Due   Covid-19:Done 10/01/19, 10/29/19, 06/22/20, 11/20/2020, & 06/20/21  Advanced directives: Please bring a copy of your health care power of attorney and living will to the office to be added to your chart at your convenience.   Conditions/risks identified: Aim for 30 minutes of exercise or walking each day, drink 6-8 glasses of water and eat lots of fruits and vegetables.   Next appointment: Follow up in one year for your annual wellness visit    Preventive Care 65 Years and Older, Female Preventive care refers to lifestyle choices and visits with your health care provider that can promote health and wellness. What does preventive care include? A yearly physical exam. This is also called an annual well check. Dental exams once or twice a year. Routine eye exams. Ask your health care provider how often you should have your eyes checked. Personal lifestyle choices, including: Daily care of your teeth and gums. Regular physical activity. Eating a healthy diet. Avoiding tobacco and drug use. Limiting alcohol use. Practicing safe  sex. Taking low-dose aspirin every day. Taking vitamin and mineral supplements as recommended by your health care provider. What happens during an annual well check? The services and screenings done by your health care provider during your annual well check will depend on your age, overall health, lifestyle risk factors, and family history of disease. Counseling  Your health care provider may ask you questions about your: Alcohol use. Tobacco use. Drug use. Emotional well-being. Home and relationship well-being. Sexual activity. Eating habits. History of falls. Memory and ability to understand (cognition). Work and work Statistician. Reproductive health. Screening  You may have the following tests or measurements: Height, weight, and BMI. Blood pressure. Lipid and cholesterol levels. These may be checked every 5 years, or more frequently if you are over 52 years old. Skin check. Lung cancer screening. You may have this screening every year starting at age 2 if you have a 30-pack-year history of smoking and currently smoke or have quit within the past 15 years. Fecal occult blood test (FOBT) of the stool. You may have this test every year starting at age 61. Flexible sigmoidoscopy or colonoscopy. You may have a sigmoidoscopy every 5 years or a colonoscopy every 10 years starting at age 49. Hepatitis C blood test. Hepatitis B blood test. Sexually transmitted disease (STD) testing. Diabetes screening. This is done by checking your blood sugar (glucose) after you have not eaten for a while (fasting). You may have this done every 1-3 years. Bone density scan. This is done to screen for osteoporosis. You may have this done starting at age 24. Mammogram. This may be done every 1-2 years. Talk to your health care provider  about how often you should have regular mammograms. Talk with your health care provider about your test results, treatment options, and if necessary, the need for more  tests. Vaccines  Your health care provider may recommend certain vaccines, such as: Influenza vaccine. This is recommended every year. Tetanus, diphtheria, and acellular pertussis (Tdap, Td) vaccine. You may need a Td booster every 10 years. Zoster vaccine. You may need this after age 29. Pneumococcal 13-valent conjugate (PCV13) vaccine. One dose is recommended after age 11. Pneumococcal polysaccharide (PPSV23) vaccine. One dose is recommended after age 60. Talk to your health care provider about which screenings and vaccines you need and how often you need them. This information is not intended to replace advice given to you by your health care provider. Make sure you discuss any questions you have with your health care provider. Document Released: 09/01/2015 Document Revised: 04/24/2016 Document Reviewed: 06/06/2015 Elsevier Interactive Patient Education  2017 [place] Prevention in the Home Falls can cause injuries. They can happen to people of all ages. There are many things you can do to make your home safe and to help prevent falls. What can I do on the outside of my home? Regularly fix the edges of walkways and driveways and fix any cracks. Remove anything that might make you trip as you walk through a door, such as a raised step or threshold. Trim any bushes or trees on the path to your home. Use bright outdoor lighting. Clear any walking paths of anything that might make someone trip, such as rocks or tools. Regularly check to see if handrails are loose or broken. Make sure that both sides of any steps have handrails. Any raised decks and porches should have guardrails on the edges. Have any leaves, snow, or ice cleared regularly. Use sand or salt on walking paths during winter. Clean up any spills in your garage right away. This includes oil or grease spills. What can I do in the bathroom? Use night lights. Install grab bars by the toilet and in the tub and shower.  Do not use towel bars as grab bars. Use non-skid mats or decals in the tub or shower. If you need to sit down in the shower, use a plastic, non-slip stool. Keep the floor dry. Clean up any water that spills on the floor as soon as it happens. Remove soap buildup in the tub or shower regularly. Attach bath mats securely with double-sided non-slip rug tape. Do not have throw rugs and other things on the floor that can make you trip. What can I do in the bedroom? Use night lights. Make sure that you have a light by your bed that is easy to reach. Do not use any sheets or blankets that are too big for your bed. They should not hang down onto the floor. Have a firm chair that has side arms. You can use this for support while you get dressed. Do not have throw rugs and other things on the floor that can make you trip. What can I do in the kitchen? Clean up any spills right away. Avoid walking on wet floors. Keep items that you use a lot in easy-to-reach places. If you need to reach something above you, use a strong step stool that has a grab bar. Keep electrical cords out of the way. Do not use floor polish or wax that makes floors slippery. If you must use wax, use non-skid floor wax. Do not have throw rugs and  other things on the floor that can make you trip. What can I do with my stairs? Do not leave any items on the stairs. Make sure that there are handrails on both sides of the stairs and use them. Fix handrails that are broken or loose. Make sure that handrails are as long as the stairways. Check any carpeting to make sure that it is firmly attached to the stairs. Fix any carpet that is loose or worn. Avoid having throw rugs at the top or bottom of the stairs. If you do have throw rugs, attach them to the floor with carpet tape. Make sure that you have a light switch at the top of the stairs and the bottom of the stairs. If you do not have them, ask someone to add them for you. What else  can I do to help prevent falls? Wear shoes that: Do not have high heels. Have rubber bottoms. Are comfortable and fit you well. Are closed at the toe. Do not wear sandals. If you use a stepladder: Make sure that it is fully opened. Do not climb a closed stepladder. Make sure that both sides of the stepladder are locked into place. Ask someone to hold it for you, if possible. Clearly mark and make sure that you can see: Any grab bars or handrails. First and last steps. Where the edge of each step is. Use tools that help you move around (mobility aids) if they are needed. These include: Canes. Walkers. Scooters. Crutches. Turn on the lights when you go into a dark area. Replace any light bulbs as soon as they burn out. Set up your furniture so you have a clear path. Avoid moving your furniture around. If any of your floors are uneven, fix them. If there are any pets around you, be aware of where they are. Review your medicines with your doctor. Some medicines can make you feel dizzy. This can increase your chance of falling. Ask your doctor what other things that you can do to help prevent falls. This information is not intended to replace advice given to you by your health care provider. Make sure you discuss any questions you have with your health care provider. Document Released: 06/01/2009 Document Revised: 01/11/2016 Document Reviewed: 09/09/2014 Elsevier Interactive Patient Education  2017 [employer].

## 2021-08-28 ENCOUNTER — Encounter (INDEPENDENT_AMBULATORY_CARE_PROVIDER_SITE_OTHER): Payer: Self-pay | Admitting: *Deleted

## 2021-09-11 DIAGNOSIS — E870 Hyperosmolality and hypernatremia: Secondary | ICD-10-CM | POA: Diagnosis not present

## 2021-09-11 DIAGNOSIS — I5032 Chronic diastolic (congestive) heart failure: Secondary | ICD-10-CM | POA: Diagnosis not present

## 2021-09-11 DIAGNOSIS — I129 Hypertensive chronic kidney disease with stage 1 through stage 4 chronic kidney disease, or unspecified chronic kidney disease: Secondary | ICD-10-CM | POA: Diagnosis not present

## 2021-09-11 DIAGNOSIS — R809 Proteinuria, unspecified: Secondary | ICD-10-CM | POA: Diagnosis not present

## 2021-09-11 DIAGNOSIS — N184 Chronic kidney disease, stage 4 (severe): Secondary | ICD-10-CM | POA: Diagnosis not present

## 2021-10-10 DIAGNOSIS — M130 Polyarthritis, unspecified: Secondary | ICD-10-CM | POA: Diagnosis not present

## 2021-10-10 DIAGNOSIS — G894 Chronic pain syndrome: Secondary | ICD-10-CM | POA: Diagnosis not present

## 2021-10-10 DIAGNOSIS — M25512 Pain in left shoulder: Secondary | ICD-10-CM | POA: Diagnosis not present

## 2021-10-10 DIAGNOSIS — G4700 Insomnia, unspecified: Secondary | ICD-10-CM | POA: Diagnosis not present

## 2021-10-10 DIAGNOSIS — M961 Postlaminectomy syndrome, not elsewhere classified: Secondary | ICD-10-CM | POA: Diagnosis not present

## 2021-10-10 DIAGNOSIS — Z79891 Long term (current) use of opiate analgesic: Secondary | ICD-10-CM | POA: Diagnosis not present

## 2021-10-10 DIAGNOSIS — M5459 Other low back pain: Secondary | ICD-10-CM | POA: Diagnosis not present

## 2021-10-10 DIAGNOSIS — M4725 Other spondylosis with radiculopathy, thoracolumbar region: Secondary | ICD-10-CM | POA: Diagnosis not present

## 2021-10-11 ENCOUNTER — Other Ambulatory Visit: Payer: Self-pay

## 2021-10-11 ENCOUNTER — Ambulatory Visit (INDEPENDENT_AMBULATORY_CARE_PROVIDER_SITE_OTHER): Payer: Medicare Other | Admitting: Endocrinology

## 2021-10-11 ENCOUNTER — Encounter: Payer: Self-pay | Admitting: Endocrinology

## 2021-10-11 VITALS — BP 118/64 | HR 71 | Ht 65.0 in

## 2021-10-11 DIAGNOSIS — E042 Nontoxic multinodular goiter: Secondary | ICD-10-CM | POA: Diagnosis not present

## 2021-10-11 NOTE — Patient Instructions (Addendum)
Please continue the same levothyroxine.   Please come back for a follow-up appointment in 1 year.  If not, Dr Lajuana Ripple would be happy to help you.

## 2021-10-11 NOTE — Progress Notes (Signed)
Subjective:    Patient ID: Regina Horton, female    DOB: Jan 11, 1951, 71 y.o.   MRN: [id number]  HPI Pt returns for f/u of a small multinodular goiter and hypothyroidism (both dx'ed 2011; f/u US in 2019 showed nodules were too small to need bx; she has been on synthroid since 2011).   pt states she feels well in general, except for fatigue.   Past Medical History:  Diagnosis Date   Allergic rhinitis    Allergy Dont remember   Nickel ears pierced when I was 14    Sinus meds 2017   Angina at rest [facility])    Anxiety    Aortic sclerosis    Arthritis Many years ago   Cataract 2016   CHF (congestive heart failure) ([place]) 2020   Dr Claiborne Billings found it.   Chronic back pain    Chronic constipation    CKD (chronic kidney disease)    Depression    Fibromyalgia    GERD (gastroesophageal reflux disease)    Hyperlipidemia    Hypertension    Hypertension    Mitral regurgitation    Osteoporosis    Palpitations    Thyroid disease    Tricuspid regurgitation     Past Surgical History:  Procedure Laterality Date   BACK SURGERY  08/20/2003   BREAST LUMPECTOMY  08/19/1968   right    CATARACT EXTRACTION W/PHACO Right 06/15/2015   Procedure: CATARACT EXTRACTION PHACO AND INTRAOCULAR LENS PLACEMENT ([place]);  Surgeon: Tonny Branch, MD;  Location: [facility];  Service: Ophthalmology;  Laterality: Right;  CDE: 10.33   CATARACT EXTRACTION W/PHACO Left 07/17/2015   Procedure: CATARACT EXTRACTION PHACO AND INTRAOCULAR LENS PLACEMENT ([facility]);  Surgeon: Tonny Branch, MD;  Location: [facility];  Service: Ophthalmology;  Laterality: Left;  CDE:7.60   EYE SURGERY  2018   Cataracts   FRACTURE SURGERY  2016   Left ankle.   NM MYOCAR PERF WALL MOTION  02/16/2009   normal   ORIF ANKLE FRACTURE Left 10/26/2014   Procedure: OPEN REDUCTION INTERNAL FIXATION (ORIF) ANKLE FRACTURE;  Surgeon: Sanjuana Kava, MD;  Location: [facility];  Service: Orthopedics;  Laterality: Left;   SPINE SURGERY  2005   Fusion with hardware    TONSILLECTOMY  childhood    TUBAL LIGATION  08/20/1971   US ECHOCARDIOGRAPHY  12/19/2008   mild mitral annular ca+,AOV mildly sclerotic,mild MR & TR    Social History   Socioeconomic History   Marital status: Married    Spouse name: Minerva Fester   Number of children: 2   Years of education: 12   Highest education level: High school graduate  Occupational History   Occupation: disabled   Tobacco Use   Smoking status: Former    Packs/day: 1.00    Years: 20.00    Pack years: 20.00    Types: Cigarettes    Quit date: 12/24/2003    Years since quitting: 17.8   Smokeless tobacco: Never   Tobacco comments:    Stopped on May 07,2005 before back surgery. Haven't smoked sence.  Vaping Use   Vaping Use: Never used  Substance and Sexual Activity   Alcohol use: No   Drug use: Not Currently    Frequency: 1.0 times per week    Types: Marijuana    Comment: boils marijuana in water and makes a tea about 3 times a week   Sexual activity: Not Currently    Birth control/protection: Surgical    Comment: Tubes tied 1973  Other Topics  Concern   Not on file  Social History Narrative   Not on file   Social Determinants of Health   Financial Resource Strain: Medium Risk   Difficulty of Paying Living Expenses: Somewhat hard  Food Insecurity: Food Insecurity Present   Worried About Running Out of Food in the Last Year: Sometimes true   Ran Out of Food in the Last Year: Sometimes true  Transportation Needs: No Transportation Needs   Lack of Transportation (Medical): No   Lack of Transportation (Non-Medical): No  Physical Activity: Insufficiently Active   Days of Exercise per Week: 5 days   Minutes of Exercise per Session: 10 min  Stress: No Stress Concern Present   Feeling of Stress : Only a little  Social Connections: Moderately Isolated   Frequency of Communication with Friends and Family: Once a week   Frequency of Social Gatherings with Friends and Family: Once a week   Attends Religious  Services: 1 to 4 times per year   Active Member of [employer] or Organizations: No   Attends Music therapist: Never   Marital Status: Married  Human resources officer Violence: Not At Risk   Fear of Current or Ex-Partner: No   Emotionally Abused: No   Physically Abused: No   Sexually Abused: No    Current Outpatient Medications on File Prior to Visit  Medication Sig Dispense Refill   atorvastatin (LIPITOR) 40 MG tablet TAKE 1 TABLET (40 MG TOTAL) BY MOUTH DAILY. 90 tablet 2   Carboxymethylcellul-Glycerin (REFRESH RELIEVA OP) Apply 1 drop to eye. 6 times per day     denosumab (PROLIA) 60 MG/ML SOSY injection Inject 60 mg into the skin every 6 (six) months.     diltiazem (CARDIZEM CD) 120 MG 24 hr capsule Take 120 mg by mouth daily.     Docusate Calcium (STOOL SOFTENER PO) Take by mouth.     DULoxetine (CYMBALTA) 60 MG capsule Take 1 capsule (60 mg total) by mouth daily. 90 capsule 3   fenofibrate 160 MG tablet TAKE 1 TABLET EVERY DAY 90 tablet 2   fluticasone (FLONASE) 50 MCG/ACT nasal spray USE 2 SPRAYS IN EACH NOSTRIL EVERY DAY 48 g 1   furosemide (LASIX) 20 MG tablet Take 40mg  each morning,  20mg  each evening 90 tablet 1   gabapentin (NEURONTIN) 600 MG tablet Take 1/2 tab every morning and 1 tablet every evening 135 tablet 3   hydrOXYzine (ATARAX/VISTARIL) 25 MG tablet Take 25-50 mg by mouth at bedtime.     levothyroxine (SYNTHROID) 88 MCG tablet TAKE 1 TABLET (88 MCG TOTAL) BY MOUTH DAILY. 90 tablet 3   losartan (COZAAR) 25 MG tablet Take 12.5 mg by mouth daily.     methocarbamol (ROBAXIN) 500 MG tablet Take 500 mg by mouth 2 (two) times daily as needed.     metoprolol succinate (TOPROL-XL) 100 MG 24 hr tablet TAKE 1 TABLET EVERY MORNING AND TAKE 1/2 TABLET EVERY EVENING. 135 tablet 1   oxyCODONE-acetaminophen (PERCOCET) 7.5-325 MG tablet Take 1 tablet by mouth every 8 (eight) hours as needed.     pantoprazole (PROTONIX) 40 MG tablet Take 1 tablet (40 mg total) by mouth daily. 90  tablet 3   No current facility-administered medications on file prior to visit.    Allergies  Allergen Reactions   Levocetirizine Hives and Itching   Xyzal [Levocetirizine Dihydrochloride] Hives and Itching   Codeine     Pt states she does not have any problems with codeine.   Nickel Rash  Family History  Problem Relation Age of Onset   Rheum arthritis Mother    Osteoporosis Mother    Arthritis Mother    Depression Mother    Varicose Veins Mother    Heart disease Father    Diabetes Father    Hyperlipidemia Father    Bladder Cancer Father    Cancer Father    Depression Father    Hypertension Father    Obesity Father    Heart disease Brother    Arthritis Brother    Alcohol abuse Brother    Drug abuse Brother    Depression Maternal Grandmother     BP 118/64 (BP Location: Left Arm, Patient Position: Sitting, Cuff Size: Normal)    Pulse 71    Ht 5\' 5"  (1.651 m)    SpO2 95%    BMI 39.44 kg/m    Review of Systems     Objective:   Physical Exam VITAL SIGNS:  See vs page GENERAL: no distress.  In [facility] NECK: There is no palpable thyroid enlargement.  No thyroid nodule is palpable.  No palpable lymphadenopathy at the anterior neck.     Lab Results  Component Value Date   TSH 2.340 08/14/2021      Assessment & Plan:  Hypothyroidism: well-controlled  Patient Instructions  Please continue the same levothyroxine.   Please come back for a follow-up appointment in 1 year.  If not, Dr Lajuana Ripple would be happy to help you.

## 2021-10-17 DIAGNOSIS — Z20822 Contact with and (suspected) exposure to covid-19: Secondary | ICD-10-CM | POA: Diagnosis not present

## 2021-10-19 DIAGNOSIS — Z20822 Contact with and (suspected) exposure to covid-19: Secondary | ICD-10-CM | POA: Diagnosis not present

## 2021-10-26 ENCOUNTER — Telehealth: Payer: Self-pay

## 2021-10-26 NOTE — Chronic Care Management (AMB) (Signed)
?  Chronic Care Management  ? ?Note ? ?10/26/2021 ?Name: [name] MRN: [id number] DOB: 1950-10-24 ? ?Regina Horton is a 71 y.o. year old female who is a primary care patient of [name], DO. I reached out to Karren Cobble by phone today in response to a referral sent by [name]'s PCP. ? ?Regina Horton was given information about Chronic Care Management services today including:  ?CCM service includes personalized support from designated clinical staff supervised by her physician, including individualized plan of care and coordination with other care providers ?24/7 contact phone numbers for assistance for urgent and routine care needs. ?Service will only be billed when office clinical staff spend 20 minutes or more in a month to coordinate care. ?Only one practitioner may furnish and bill the service in a calendar month. ?The patient may stop CCM services at any time (effective at the end of the month) by phone call to the office staff. ?The patient is responsible for co-pay (up to 20% after annual deductible is met) if co-pay is required by the individual health plan.  ? ?Patient agreed to services and verbal consent obtained.  ? ?Follow up plan: ?Telephone appointment with care management team member scheduled for:11/07/2021 ? ?Noreene Larsson, RMA ?Care Guide, Embedded Care Coordination ?[facility]  ?[place], [place] [address] ?Direct Dial: [phone] ?[email or web address] ?Website: [email or web address]  ? ?

## 2021-10-26 NOTE — Chronic Care Management (AMB) (Signed)
?  Chronic Care Management  ? ?Outreach Note ? ?10/26/2021 ?Name: Regina Horton MRN: [id number] DOB: Mar 30, 1951 ? ?Regina Horton is a 71 y.o. year old female who is a primary care patient of [name], DO. I reached out to Karren Cobble by phone today in response to a referral sent by Ms. Francis Gaines Steckman's primary care provider. ? ?An unsuccessful telephone outreach was attempted today. The patient was referred to the case management team for assistance with care management and care coordination.  ? ?Follow Up Plan: The care management team will reach out to the patient again after 2:30pm.  ? ?Noreene Larsson, RMA ?Care Guide, Embedded Care Coordination ?[facility]  ?[place], [place] [address] ?Direct Dial: [phone] ?[email or web address] ?Website: [email or web address]  ? ?

## 2021-11-03 DIAGNOSIS — Z20828 Contact with and (suspected) exposure to other viral communicable diseases: Secondary | ICD-10-CM | POA: Diagnosis not present

## 2021-11-07 ENCOUNTER — Telehealth: Payer: Medicare Other

## 2021-11-07 DIAGNOSIS — Z20822 Contact with and (suspected) exposure to covid-19: Secondary | ICD-10-CM | POA: Diagnosis not present

## 2021-11-12 ENCOUNTER — Other Ambulatory Visit: Payer: Self-pay | Admitting: Cardiovascular Disease

## 2021-11-14 DIAGNOSIS — Z20828 Contact with and (suspected) exposure to other viral communicable diseases: Secondary | ICD-10-CM | POA: Diagnosis not present

## 2021-11-17 DIAGNOSIS — Z20828 Contact with and (suspected) exposure to other viral communicable diseases: Secondary | ICD-10-CM | POA: Diagnosis not present

## 2021-12-03 DIAGNOSIS — Z20822 Contact with and (suspected) exposure to covid-19: Secondary | ICD-10-CM | POA: Diagnosis not present

## 2021-12-20 DIAGNOSIS — Z20822 Contact with and (suspected) exposure to covid-19: Secondary | ICD-10-CM | POA: Diagnosis not present

## 2021-12-24 DIAGNOSIS — Z20822 Contact with and (suspected) exposure to covid-19: Secondary | ICD-10-CM | POA: Diagnosis not present

## 2022-01-02 DIAGNOSIS — R252 Cramp and spasm: Secondary | ICD-10-CM | POA: Diagnosis not present

## 2022-01-02 DIAGNOSIS — Z79891 Long term (current) use of opiate analgesic: Secondary | ICD-10-CM | POA: Diagnosis not present

## 2022-01-02 DIAGNOSIS — M5415 Radiculopathy, thoracolumbar region: Secondary | ICD-10-CM | POA: Diagnosis not present

## 2022-01-02 DIAGNOSIS — M130 Polyarthritis, unspecified: Secondary | ICD-10-CM | POA: Diagnosis not present

## 2022-01-02 DIAGNOSIS — M961 Postlaminectomy syndrome, not elsewhere classified: Secondary | ICD-10-CM | POA: Diagnosis not present

## 2022-01-31 ENCOUNTER — Telehealth: Payer: Self-pay | Admitting: *Deleted

## 2022-01-31 NOTE — Telephone Encounter (Signed)
Pt scheduled with Triage Nurse 02/13/22 at 3:30 for Prolia Injection.

## 2022-02-13 ENCOUNTER — Ambulatory Visit: Payer: Medicare Other

## 2022-03-12 ENCOUNTER — Telehealth: Payer: Self-pay | Admitting: *Deleted

## 2022-03-12 NOTE — Telephone Encounter (Signed)
Called to talk with pt about her prolia - she cancelled last appt.   LM with son to have her call when she wakes to schedule.   We have her prolia here in fridge with her name on it. Just need to sched asap. \

## 2022-03-13 ENCOUNTER — Other Ambulatory Visit: Payer: Self-pay | Admitting: Family Medicine

## 2022-03-18 ENCOUNTER — Ambulatory Visit (INDEPENDENT_AMBULATORY_CARE_PROVIDER_SITE_OTHER): Payer: Medicare Other

## 2022-03-18 DIAGNOSIS — M810 Age-related osteoporosis without current pathological fracture: Secondary | ICD-10-CM

## 2022-03-18 MED ORDER — DENOSUMAB 60 MG/ML ~~LOC~~ SOSY
60.0000 mg | PREFILLED_SYRINGE | SUBCUTANEOUS | Status: DC
  Administered 2022-03-18 – 2022-12-30 (×2): 60 mg via SUBCUTANEOUS

## 2022-03-18 NOTE — Progress Notes (Signed)
Patient was given prolia injection and tolerated well.

## 2022-03-27 DIAGNOSIS — M961 Postlaminectomy syndrome, not elsewhere classified: Secondary | ICD-10-CM | POA: Diagnosis not present

## 2022-03-27 DIAGNOSIS — M5415 Radiculopathy, thoracolumbar region: Secondary | ICD-10-CM | POA: Diagnosis not present

## 2022-03-27 DIAGNOSIS — M130 Polyarthritis, unspecified: Secondary | ICD-10-CM | POA: Diagnosis not present

## 2022-03-27 DIAGNOSIS — R252 Cramp and spasm: Secondary | ICD-10-CM | POA: Diagnosis not present

## 2022-03-27 DIAGNOSIS — M5459 Other low back pain: Secondary | ICD-10-CM | POA: Diagnosis not present

## 2022-03-27 DIAGNOSIS — Z79891 Long term (current) use of opiate analgesic: Secondary | ICD-10-CM | POA: Diagnosis not present

## 2022-04-15 ENCOUNTER — Other Ambulatory Visit: Payer: Self-pay | Admitting: Cardiovascular Disease

## 2022-04-16 ENCOUNTER — Other Ambulatory Visit: Payer: Self-pay | Admitting: Cardiovascular Disease

## 2022-04-19 DIAGNOSIS — R809 Proteinuria, unspecified: Secondary | ICD-10-CM | POA: Diagnosis not present

## 2022-04-19 DIAGNOSIS — N184 Chronic kidney disease, stage 4 (severe): Secondary | ICD-10-CM | POA: Diagnosis not present

## 2022-04-19 DIAGNOSIS — E870 Hyperosmolality and hypernatremia: Secondary | ICD-10-CM | POA: Diagnosis not present

## 2022-04-19 DIAGNOSIS — I129 Hypertensive chronic kidney disease with stage 1 through stage 4 chronic kidney disease, or unspecified chronic kidney disease: Secondary | ICD-10-CM | POA: Diagnosis not present

## 2022-04-19 DIAGNOSIS — I5032 Chronic diastolic (congestive) heart failure: Secondary | ICD-10-CM | POA: Diagnosis not present

## 2022-04-24 DIAGNOSIS — E870 Hyperosmolality and hypernatremia: Secondary | ICD-10-CM | POA: Diagnosis not present

## 2022-04-24 DIAGNOSIS — R809 Proteinuria, unspecified: Secondary | ICD-10-CM | POA: Diagnosis not present

## 2022-04-24 DIAGNOSIS — N184 Chronic kidney disease, stage 4 (severe): Secondary | ICD-10-CM | POA: Diagnosis not present

## 2022-04-24 DIAGNOSIS — I9589 Other hypotension: Secondary | ICD-10-CM | POA: Diagnosis not present

## 2022-04-24 DIAGNOSIS — Z8679 Personal history of other diseases of the circulatory system: Secondary | ICD-10-CM | POA: Diagnosis not present

## 2022-04-24 DIAGNOSIS — I5032 Chronic diastolic (congestive) heart failure: Secondary | ICD-10-CM | POA: Diagnosis not present

## 2022-04-24 DIAGNOSIS — E559 Vitamin D deficiency, unspecified: Secondary | ICD-10-CM | POA: Diagnosis not present

## 2022-04-24 DIAGNOSIS — Z6839 Body mass index (BMI) 39.0-39.9, adult: Secondary | ICD-10-CM | POA: Diagnosis not present

## 2022-06-13 ENCOUNTER — Encounter: Payer: Self-pay | Admitting: Family Medicine

## 2022-06-14 ENCOUNTER — Other Ambulatory Visit: Payer: Self-pay | Admitting: Family Medicine

## 2022-06-14 MED ORDER — LEVOTHYROXINE SODIUM 88 MCG PO TABS: 88.0000 ug | ORAL_TABLET | Freq: Every day | ORAL | 3 refills | Status: DC

## 2022-06-27 DIAGNOSIS — M5415 Radiculopathy, thoracolumbar region: Secondary | ICD-10-CM | POA: Diagnosis not present

## 2022-06-27 DIAGNOSIS — M130 Polyarthritis, unspecified: Secondary | ICD-10-CM | POA: Diagnosis not present

## 2022-06-27 DIAGNOSIS — R252 Cramp and spasm: Secondary | ICD-10-CM | POA: Diagnosis not present

## 2022-06-27 DIAGNOSIS — M961 Postlaminectomy syndrome, not elsewhere classified: Secondary | ICD-10-CM | POA: Diagnosis not present

## 2022-07-26 ENCOUNTER — Telehealth: Payer: Self-pay | Admitting: Family Medicine

## 2022-07-26 DIAGNOSIS — E039 Hypothyroidism, unspecified: Secondary | ICD-10-CM

## 2022-07-26 DIAGNOSIS — M810 Age-related osteoporosis without current pathological fracture: Secondary | ICD-10-CM

## 2022-07-26 DIAGNOSIS — N184 Chronic kidney disease, stage 4 (severe): Secondary | ICD-10-CM

## 2022-07-26 NOTE — Telephone Encounter (Signed)
Patient says she has an upcoming appt with her cardiologist and is going to [facility] in [place] to have her lab work done for that appt. Wants to know if Dr Lajuana Ripple needs her to do any certain kind of labs and if so, can we fax order to [facility] in [place]?

## 2022-08-01 DIAGNOSIS — Z6839 Body mass index (BMI) 39.0-39.9, adult: Secondary | ICD-10-CM | POA: Diagnosis not present

## 2022-08-01 DIAGNOSIS — E870 Hyperosmolality and hypernatremia: Secondary | ICD-10-CM | POA: Diagnosis not present

## 2022-08-01 DIAGNOSIS — I5032 Chronic diastolic (congestive) heart failure: Secondary | ICD-10-CM | POA: Diagnosis not present

## 2022-08-01 DIAGNOSIS — Z23 Encounter for immunization: Secondary | ICD-10-CM | POA: Diagnosis not present

## 2022-08-01 DIAGNOSIS — I9589 Other hypotension: Secondary | ICD-10-CM | POA: Diagnosis not present

## 2022-08-01 DIAGNOSIS — Z8679 Personal history of other diseases of the circulatory system: Secondary | ICD-10-CM | POA: Diagnosis not present

## 2022-08-01 DIAGNOSIS — E559 Vitamin D deficiency, unspecified: Secondary | ICD-10-CM | POA: Diagnosis not present

## 2022-08-01 DIAGNOSIS — R809 Proteinuria, unspecified: Secondary | ICD-10-CM | POA: Diagnosis not present

## 2022-08-01 DIAGNOSIS — N184 Chronic kidney disease, stage 4 (severe): Secondary | ICD-10-CM | POA: Diagnosis not present

## 2022-08-06 NOTE — Telephone Encounter (Signed)
lmtcb

## 2022-08-06 NOTE — Telephone Encounter (Signed)
Yes, future orders placed.  Please have her remind the lab to draw my labs in addition to her cardiologist's labs.  I did not order cholesterol, as I suspect that is part of what her heart doctor is ordering.  Thyroid, CMP, CBC and Vit d ordered.

## 2022-08-13 NOTE — Progress Notes (Deleted)
Cardiology Clinic Note   Patient Name: Regina Horton Date of Encounter: 08/13/2022  Primary Care Provider:  [name], DO Primary Cardiologist:  Shelva Majestic, MD  Patient Profile    Regina Horton 71 year old female presents the clinic today for follow-up evaluation of her essential hypertension and hyperlipidemia.  Past Medical History    Past Medical History:  Diagnosis Date   Allergic rhinitis    Allergy Don't remember   Nickel ears pierced when I was 14    Sinus meds 2017   Angina at rest [facility])    Anxiety    Aortic sclerosis    Arthritis Many years ago   Cataract 2016   CHF (congestive heart failure) ([place]) 2020   Dr Claiborne Billings found it.   Chronic back pain    Chronic constipation    CKD (chronic kidney disease)    Depression    Fibromyalgia    GERD (gastroesophageal reflux disease)    Hyperlipidemia    Hypertension    Hypertension    Mitral regurgitation    Osteoporosis    Palpitations    Thyroid disease    Tricuspid regurgitation    Past Surgical History:  Procedure Laterality Date   BACK SURGERY  08/20/2003   BREAST LUMPECTOMY  08/19/1968   right    CATARACT EXTRACTION W/PHACO Right 06/15/2015   Procedure: CATARACT EXTRACTION PHACO AND INTRAOCULAR LENS PLACEMENT ([place]);  Surgeon: Tonny Branch, MD;  Location: [facility];  Service: Ophthalmology;  Laterality: Right;  CDE: 10.33   CATARACT EXTRACTION W/PHACO Left 07/17/2015   Procedure: CATARACT EXTRACTION PHACO AND INTRAOCULAR LENS PLACEMENT ([facility]);  Surgeon: Tonny Branch, MD;  Location: [facility];  Service: Ophthalmology;  Laterality: Left;  CDE:7.60   EYE SURGERY  2018   Cataracts   FRACTURE SURGERY  2016   Left ankle.   NM MYOCAR PERF WALL MOTION  02/16/2009   normal   ORIF ANKLE FRACTURE Left 10/26/2014   Procedure: OPEN REDUCTION INTERNAL FIXATION (ORIF) ANKLE FRACTURE;  Surgeon: Sanjuana Kava, MD;  Location: [facility];  Service: Orthopedics;  Laterality: Left;   SPINE SURGERY  2005   Fusion with  hardware   TONSILLECTOMY  childhood    TUBAL LIGATION  08/20/1971   US ECHOCARDIOGRAPHY  12/19/2008   mild mitral annular ca+,AOV mildly sclerotic,mild MR & TR    Allergies  Allergies  Allergen Reactions   Levocetirizine Hives and Itching   Xyzal [Levocetirizine Dihydrochloride] Hives and Itching   Codeine     Pt states she does not have any problems with codeine.   Nickel Rash    History of Present Illness    Regina Horton is a PMH of CKD stage IV, essential hypertension, HLD, anxiety, depression, hypothyroidism, and lower extremity edema.  Her echocardiogram 2010 showed mild aortic sclerosis without stenosis, mild mitral regurgitation with mild calcification and mild tricuspid regurgitation with normal systolic function.  She underwent nuclear perfusion testing which was normal.  She was seen by Dr. Claiborne Billings in follow-up 1/18.  During that time she continued to have low back issues and was being seen and evaluated by Dr. Ellene Route.  She reported frequent ankle edema with occasions where it was more pronounced.  She denied chest pain, palpitations, presyncope and syncope.  Her creatinine 10/18 was 1.5, hemoglobin 13.6, LDL cholesterol was 57.  She was using a walker due to knee discomfort.  She was seen in follow-up by Dr. Claiborne Billings on 08/21/2021.  During that time she noted that  she had developed progressive lower extremity edema and recently lost over 20 pounds of fluid from her legs.  She continues to follow with Dr. Theador Hawthorne.  She also continues to be followed by her PCP at [facility].  She denied chest pain and shortness of breath.  She was continued on diltiazem 120 and reported compliance with furosemide 40 mg daily.  She was continued on Cymbalta for depression.  She was felt to be stable from a cardiac standpoint.  Follow-up was planned for 1 year.  She presents to the clinic today for follow-up evaluation and states***  *** denies chest pain, shortness of breath, lower extremity  edema, fatigue, palpitations, melena, hematuria, hemoptysis, diaphoresis, weakness, presyncope, syncope, orthopnea, and PND.  Essential hypertension-BP today*** Continue diltiazem, furosemide, losartan, metoprolol Heart healthy low-sodium diet-salty 6 given Increase physical activity as tolerated  Lower extremity edema-stable.  Weight today*** Continue furosemide Elevate lower extremities when not active Lower extremity support stockings  Anxiety and depression-reports compliance with Cymbalta.  Mood stable Follows with PCP  Hyperlipidemia-LDL*** Continue atorvastatin Heart healthy low-sodium high-fiber diet Increase physical activity as tolerated  CKD stage IV-creatinine 2.06 on 08/14/2021 Follows with nephrology  Home Medications    Prior to Admission medications   Medication Sig Start Date End Date Taking? Authorizing Provider  atorvastatin (LIPITOR) 40 MG tablet TAKE 1 TABLET EVERY DAY 04/15/22   Troy Sine, MD  Carboxymethylcellul-Glycerin (REFRESH RELIEVA OP) Apply 1 drop to eye. 6 times per day    [provider]  denosumab (PROLIA) 60 MG/ML SOSY injection Inject 60 mg into the skin every 6 (six) months.    [provider]  diltiazem (CARDIZEM CD) 120 MG 24 hr capsule Take 120 mg by mouth daily. 07/18/20   [provider]  Docusate Calcium (STOOL SOFTENER PO) Take by mouth.    [provider]  DULoxetine (CYMBALTA) 60 MG capsule Take 1 capsule (60 mg total) by mouth daily. 08/14/21   Ronnie Doss M, DO  fenofibrate 160 MG tablet TAKE 1 TABLET EVERY DAY 04/17/22   Troy Sine, MD  fluticasone [facility]) 50 MCG/ACT nasal spray USE 2 SPRAYS IN [facility] NOSTRIL EVERY DAY 03/13/22   Ronnie Doss M, DO  furosemide (LASIX) 20 MG tablet Take 40mg  each morning,  20mg  each evening 02/08/21   Ronnie Doss M, DO  gabapentin (NEURONTIN) 600 MG tablet Take 1/2 tab every morning and 1 tablet every evening 08/01/20   Ronnie Doss M,  DO  hydrOXYzine (ATARAX/VISTARIL) 25 MG tablet Take 25-50 mg by mouth at bedtime. 01/14/19   [provider]  levothyroxine (SYNTHROID) 88 MCG tablet Take 1 tablet (88 mcg total) by mouth daily. 06/14/22   [name], DO  losartan (COZAAR) 25 MG tablet Take 12.5 mg by mouth daily.    [provider]  methocarbamol (ROBAXIN) 500 MG tablet Take 500 mg by mouth 2 (two) times daily as needed.    [provider]  metoprolol succinate (TOPROL-XL) 100 MG 24 hr tablet TAKE 1 TABLET EVERY MORNING AND TAKE 1/2 TABLET EVERY EVENING. 11/12/21   Troy Sine, MD  oxyCODONE-acetaminophen (PERCOCET) 7.5-325 MG tablet Take 1 tablet by mouth every 8 (eight) hours as needed. 10/29/17   [provider]  pantoprazole (PROTONIX) 40 MG tablet Take 1 tablet (40 mg total) by mouth daily. 08/14/21   [name], DO    Family History    Family History  Problem Relation Age of Onset   Rheum arthritis Mother  Osteoporosis Mother    Arthritis Mother    Depression Mother    Varicose Veins Mother    Heart disease Father    Diabetes Father    Hyperlipidemia Father    Bladder Cancer Father    Cancer Father    Depression Father    Hypertension Father    Obesity Father    Heart disease Brother    Arthritis Brother    Alcohol abuse Brother    Drug abuse Brother    Depression Maternal Grandmother    She indicated that her mother is deceased. She indicated that her father is deceased. She indicated that both of her brothers are alive. She indicated that the status of her maternal grandmother is unknown. She indicated that her daughter is alive. She indicated that her son is alive.  Social History    Social History   Socioeconomic History   Marital status: Married    Spouse name: Minerva Fester   Number of children: 2   Years of education: 12   Highest education level: High school graduate  Occupational History   Occupation: disabled   Tobacco Use   Smoking  status: Former    Packs/day: 1.00    Years: 20.00    Total pack years: 20.00    Types: Cigarettes    Quit date: 12/24/2003    Years since quitting: 18.6   Smokeless tobacco: Never   Tobacco comments:    Stopped on May 07,2005 before back surgery. Haven't smoked sence.  Vaping Use   Vaping Use: Never used  Substance and Sexual Activity   Alcohol use: No   Drug use: Not Currently    Frequency: 1.0 times per week    Types: Marijuana    Comment: boils marijuana in water and makes a tea about 3 times a week   Sexual activity: Not Currently    Birth control/protection: Surgical    Comment: Tubes tied 1973  Other Topics Concern   Not on file  Social History Narrative   Not on file   Social Determinants of Health   Financial Resource Strain: Medium Risk (08/27/2021)   Overall Financial Resource Strain (CARDIA)    Difficulty of Paying Living Expenses: Somewhat hard  Food Insecurity: Food Insecurity Present (08/27/2021)   Hunger Vital Sign    Worried About Running Out of Food in the Last Year: Sometimes true    Ran Out of Food in the Last Year: Sometimes true  Transportation Needs: No Transportation Needs (08/27/2021)   PRAPARE - Hydrologist (Medical): No    Lack of Transportation (Non-Medical): No  Physical Activity: Insufficiently Active (08/27/2021)   Exercise Vital Sign    Days of Exercise per Week: 5 days    Minutes of Exercise per Session: 10 min  Stress: No Stress Concern Present (08/27/2021)   [place]    Feeling of Stress : Only a little  Social Connections: Moderately Isolated (08/27/2021)   Social Connection and Isolation Panel [NHANES]    Frequency of Communication with Friends and Family: Once a week    Frequency of Social Gatherings with Friends and Family: Once a week    Attends Religious Services: 1 to 4 times per year    Active Member of [employer] or Organizations: No    Attends  Archivist Meetings: Never    Marital Status: Married  Human resources officer Violence: Not At Risk (08/27/2021)   Humiliation, Afraid, Rape, and  Kick questionnaire    Fear of Current or Ex-Partner: No    Emotionally Abused: No    Physically Abused: No    Sexually Abused: No     Review of Systems    General:  No chills, fever, night sweats or weight changes.  Cardiovascular:  No chest pain, dyspnea on exertion, edema, orthopnea, palpitations, paroxysmal nocturnal dyspnea. Dermatological: No rash, lesions/masses Respiratory: No cough, dyspnea Urologic: No hematuria, dysuria Abdominal:   No nausea, vomiting, diarrhea, bright red blood per rectum, melena, or hematemesis Neurologic:  No visual changes, wkns, changes in mental status. All other systems reviewed and are otherwise negative except as noted above.  Physical Exam    VS:  There were no vitals taken for this visit. , BMI There is no height or weight on file to calculate BMI. GEN: Well nourished, well developed, in no acute distress. HEENT: normal. Neck: Supple, no JVD, carotid bruits, or masses. Cardiac: RRR, no murmurs, rubs, or gallops. No clubbing, cyanosis, edema.  Radials/DP/PT 2+ and equal bilaterally.  Respiratory:  Respirations regular and unlabored, clear to auscultation bilaterally. GI: Soft, nontender, nondistended, BS + x 4. MS: no deformity or atrophy. Skin: warm and dry, no rash. Neuro:  Strength and sensation are intact. Psych: Normal affect.  Accessory Clinical Findings    Recent Labs: 08/14/2021: ALT 11; BUN 34; Creatinine, Ser 2.06; Potassium 4.9; Sodium 148; TSH 2.340   Recent Lipid Panel    Component Value Date/Time   CHOL 156 01/04/2021 1551   TRIG 156 (H) 01/04/2021 1551   HDL 58 01/04/2021 1551   CHOLHDL 2.7 01/04/2021 1551   CHOLHDL 3.0 05/12/2015 1459   VLDL 30 05/12/2015 1459   LDLCALC 72 01/04/2021 1551    No BP recorded.  {Refresh Note OR Click here to enter BP  :1}***    ECG  personally reviewed by me today- *** - No acute changes  Echocardiogram 04/12/2020  IMPRESSIONS     1. Left ventricular ejection fraction, by estimation, is 60 to 65%. The  left ventricle has normal function. The left ventricle has no regional  wall motion abnormalities. There is mild left ventricular hypertrophy.  Left ventricular diastolic parameters  are consistent with Grade I diastolic dysfunction (impaired relaxation).   2. Right ventricular systolic function is normal. The right ventricular  size is normal.   3. The mitral valve is normal in structure. No evidence of mitral valve  regurgitation. No evidence of mitral stenosis.   4. The aortic valve has an indeterminant number of cusps. Aortic valve  regurgitation is not visualized. No aortic stenosis is present.   5. The inferior vena cava is normal in size with greater than 50%  respiratory variability, suggesting right atrial pressure of 3 mmHg.   FINDINGS   Left Ventricle: Left ventricular ejection fraction, by estimation, is 60  to 65%. The left ventricle has normal function. The left ventricle has no  regional wall motion abnormalities. The left ventricular internal cavity  size was normal in size. There is   mild left ventricular hypertrophy. Left ventricular diastolic parameters  are consistent with Grade I diastolic dysfunction (impaired relaxation).   Right Ventricle: The right ventricular size is normal. No increase in  right ventricular wall thickness. Right ventricular systolic function is  normal.   Left Atrium: Left atrial size was normal in size.   Right Atrium: Right atrial size was normal in size.   Pericardium: There is no evidence of pericardial effusion.   Mitral Valve:  The mitral valve is normal in structure. No evidence of  mitral valve regurgitation. No evidence of mitral valve stenosis.   Tricuspid Valve: The tricuspid valve is normal in structure. Tricuspid  valve regurgitation is not  demonstrated. No evidence of tricuspid  stenosis.   Aortic Valve: The aortic valve has an indeterminant number of cusps.  Aortic valve regurgitation is not visualized. No aortic stenosis is  present. Aortic valve mean gradient measures 5.3 mmHg. Aortic valve peak  gradient measures 10.2 mmHg. Aortic valve  area, by VTI measures 2.58 cm.   Pulmonic Valve: The pulmonic valve was not well visualized. Pulmonic valve  regurgitation is not visualized. No evidence of pulmonic stenosis.   Aorta: The aortic root is normal in size and structure.   Venous: The inferior vena cava is normal in size with greater than 50%  respiratory variability, suggesting right atrial pressure of 3 mmHg.   IAS/Shunts: No atrial level shunt detected by color flow Doppler.   Assessment & Plan   1.  ***   Jossie Ng. Jaivian Battaglini NP-C     08/13/2022, 7:33 AM [place] [address] Office [phone] Fax [phone]    I spent***minutes examining this patient, reviewing medications, and using patient centered shared decision making involving her cardiac care.  Prior to her visit I spent greater than 20 minutes reviewing her past medical history,  medications, and prior cardiac tests.

## 2022-08-13 NOTE — Telephone Encounter (Signed)
Patient had already been to have labs drawn before orders were entered.  She now has Covid but when she is better and out of quarantine, she will go back to [facility] in [place] and have the labs drawn that were entered by Dr. Lajuana Ripple.

## 2022-08-20 ENCOUNTER — Ambulatory Visit: Payer: Medicare Other | Admitting: Physician Assistant

## 2022-08-21 NOTE — Telephone Encounter (Signed)
Left message to call back-cell. Reschedule appt 1-11?? Left message w/son to call or message back with clarification about appointments as she has COVID right now.

## 2022-08-22 ENCOUNTER — Ambulatory Visit: Payer: Medicare Other | Admitting: Nurse Practitioner

## 2022-08-22 ENCOUNTER — Ambulatory Visit: Payer: Medicare Other | Admitting: General Practice

## 2022-08-29 ENCOUNTER — Ambulatory Visit: Payer: Medicare Other | Admitting: General Practice

## 2022-08-30 ENCOUNTER — Other Ambulatory Visit: Payer: Self-pay | Admitting: Family Medicine

## 2022-08-30 MED ORDER — DENOSUMAB 60 MG/ML ~~LOC~~ SOSY: 60.0000 mg | PREFILLED_SYRINGE | SUBCUTANEOUS | 0 refills | Status: DC

## 2022-08-30 NOTE — Progress Notes (Signed)
Rx for Prolia sent to [facility]. Patient son Keylie Beavers aware per [facility] and he is also aware she is due after 09/19/22

## 2022-09-15 ENCOUNTER — Encounter: Payer: Self-pay | Admitting: Family Medicine

## 2022-09-15 ENCOUNTER — Encounter: Payer: Self-pay | Admitting: Cardiovascular Disease

## 2022-09-15 DIAGNOSIS — G8929 Other chronic pain: Secondary | ICD-10-CM

## 2022-09-15 DIAGNOSIS — K219 Gastro-esophageal reflux disease without esophagitis: Secondary | ICD-10-CM

## 2022-09-16 MED ORDER — ATORVASTATIN CALCIUM 40 MG PO TABS: 40.0000 mg | ORAL_TABLET | Freq: Every day | ORAL | 0 refills | Status: DC

## 2022-09-16 MED ORDER — LEVOTHYROXINE SODIUM 88 MCG PO TABS: 88.0000 ug | ORAL_TABLET | Freq: Every day | ORAL | 3 refills | Status: DC

## 2022-09-16 MED ORDER — FENOFIBRATE 160 MG PO TABS: 160.0000 mg | ORAL_TABLET | Freq: Every day | ORAL | 0 refills | Status: DC

## 2022-09-16 MED ORDER — PANTOPRAZOLE SODIUM 40 MG PO TBEC: 40.0000 mg | DELAYED_RELEASE_TABLET | Freq: Every day | ORAL | 3 refills | Status: DC

## 2022-09-16 MED ORDER — DILTIAZEM HCL ER COATED BEADS 120 MG PO CP24: 120.0000 mg | ORAL_CAPSULE | Freq: Every day | ORAL | 0 refills | Status: DC

## 2022-09-16 MED ORDER — METOPROLOL SUCCINATE ER 100 MG PO TB24: ORAL_TABLET | ORAL | 0 refills | Status: DC

## 2022-09-16 MED ORDER — DULOXETINE HCL 60 MG PO CPEP: 60.0000 mg | ORAL_CAPSULE | Freq: Every day | ORAL | 3 refills | Status: DC

## 2022-09-16 NOTE — Telephone Encounter (Signed)
[place], please make sure that Ms Mun's vaccines are added to her chart.  If I'm not mistaken, the Prolia is usually sent to the pharmacy and the pt brings it here for administration.  Please check with Abigail Butts on that.  I think a soft brace from her local pharmacy is fine for the knee. If she wants a referral to PT let me know.

## 2022-09-18 MED ORDER — OMEGA-3-ACID ETHYL ESTERS 1 G PO CAPS: 2.0000 g | ORAL_CAPSULE | Freq: Two times a day (BID) | ORAL | 3 refills | Status: DC

## 2022-09-24 NOTE — Progress Notes (Unsigned)
Cardiology Clinic Note   Patient Name: MONTIA HASLIP Date of Encounter: 09/26/2022  Primary Care Provider:  [name], DO Primary Cardiologist:  Shelva Majestic, MD  Patient Profile    KYLEA BERRONG 72 year old female presents the clinic today for follow-up evaluation of her essential hypertension and hyperlipidemia.   Past Medical History    Past Medical History:  Diagnosis Date   Allergic rhinitis    Allergy Don't remember   Nickel ears pierced when I was 14    Sinus meds 2017   Angina at rest    Anxiety    Aortic sclerosis    Arthritis Many years ago   Cataract 2016   CHF (congestive heart failure) ([place]) 2020   Dr Claiborne Billings found it.   Chronic back pain    Chronic constipation    CKD (chronic kidney disease)    Depression    Fibromyalgia    GERD (gastroesophageal reflux disease)    Hyperlipidemia    Hypertension    Hypertension    Mitral regurgitation    Osteoporosis    Palpitations    Thyroid disease    Tricuspid regurgitation    Past Surgical History:  Procedure Laterality Date   BACK SURGERY  08/20/2003   BREAST LUMPECTOMY  08/19/1968   right    CATARACT EXTRACTION W/PHACO Right 06/15/2015   Procedure: CATARACT EXTRACTION PHACO AND INTRAOCULAR LENS PLACEMENT ([place]);  Surgeon: Tonny Branch, MD;  Location: [facility];  Service: Ophthalmology;  Laterality: Right;  CDE: 10.33   CATARACT EXTRACTION W/PHACO Left 07/17/2015   Procedure: CATARACT EXTRACTION PHACO AND INTRAOCULAR LENS PLACEMENT ([facility]);  Surgeon: Tonny Branch, MD;  Location: [facility];  Service: Ophthalmology;  Laterality: Left;  CDE:7.60   EYE SURGERY  2018   Cataracts   FRACTURE SURGERY  2016   Left ankle.   NM MYOCAR PERF WALL MOTION  02/16/2009   normal   ORIF ANKLE FRACTURE Left 10/26/2014   Procedure: OPEN REDUCTION INTERNAL FIXATION (ORIF) ANKLE FRACTURE;  Surgeon: Sanjuana Kava, MD;  Location: [facility];  Service: Orthopedics;  Laterality: Left;   SPINE SURGERY  2005   Fusion with hardware    TONSILLECTOMY  childhood    TUBAL LIGATION  08/20/1971   US ECHOCARDIOGRAPHY  12/19/2008   mild mitral annular ca+,AOV mildly sclerotic,mild MR & TR    Allergies  Allergies  Allergen Reactions   Levocetirizine Hives and Itching   Xyzal [Levocetirizine Dihydrochloride] Hives and Itching   Codeine     Pt states she does not have any problems with codeine.   Nickel Rash    History of Present Illness    VANESSA KAMPF is a PMH of CKD stage IV, essential hypertension, HLD, anxiety, depression, hypothyroidism, and lower extremity edema.  Her echocardiogram 2010 showed mild aortic sclerosis without stenosis, mild mitral regurgitation with mild calcification and mild tricuspid regurgitation with normal systolic function.  She underwent nuclear perfusion testing which was normal.   She was seen by Dr. Claiborne Billings in follow-up 1/18.  During that time she continued to have low back issues and was being seen and evaluated by Dr. Ellene Route.  She reported frequent ankle edema with occasions where it was more pronounced.  She denied chest pain, palpitations, presyncope and syncope.  Her creatinine 10/18 was 1.5, hemoglobin 13.6, LDL cholesterol was 57.  She was using a walker due to knee discomfort.   She was seen in follow-up by Dr. Claiborne Billings on 08/21/2021.  During that time she  noted that she had developed progressive lower extremity edema and recently lost over 20 pounds of fluid from her legs.  She continues to follow with Dr. Theador Hawthorne.  She also continues to be followed by her PCP at [facility].  She denied chest pain and shortness of breath.  She was continued on diltiazem 120 and reported compliance with furosemide 40 mg daily.  She was continued on Cymbalta for depression.  She was felt to be stable from a cardiac standpoint.  Follow-up was planned for 1 year.   She presents to the clinic today for follow-up evaluation and states she feels well today.  She did have COVID during Christmas time.  She had a  cough for around 3 weeks.  She has recovered well but now feels that she has some lower extremity weakness.  Her blood pressure today is 130/56 and her EKG shows sinus rhythm with first-degree AV block incomplete right bundle branch block nonspecific ST and T wave abnormality 64 bpm.  I encouraged her to use her stationary floor peddler 10 minutes/day.  We will refill her medications and plan for follow-up in 1 year.  I have requested labs from her PCP and nephrology.   Today she denies chest pain, shortness of breath, lower extremity edema, fatigue, palpitations, melena, hematuria, hemoptysis, diaphoresis, weakness, presyncope, syncope, orthopnea, and PND.    Home Medications    Prior to Admission medications   Medication Sig Start Date End Date Taking? Authorizing Provider  atorvastatin (LIPITOR) 40 MG tablet Take 1 tablet (40 mg total) by mouth daily. 09/16/22   Troy Sine, MD  Carboxymethylcellul-Glycerin (REFRESH RELIEVA OP) Apply 1 drop to eye. 6 times per day    [provider]  denosumab (PROLIA) 60 MG/ML SOSY injection Inject 60 mg into the skin every 6 (six) months. 08/30/22   [name], DO  diltiazem (CARDIZEM CD) 120 MG 24 hr capsule Take 1 capsule (120 mg total) by mouth daily. 09/16/22   Troy Sine, MD  Docusate Calcium (STOOL SOFTENER PO) Take by mouth.    [provider]  DULoxetine (CYMBALTA) 60 MG capsule Take 1 capsule (60 mg total) by mouth daily. 09/16/22   Ronnie Doss M, DO  fenofibrate 160 MG tablet Take 1 tablet (160 mg total) by mouth daily. 09/16/22   Troy Sine, MD  fluticasone [facility]) 50 MCG/ACT nasal spray USE 2 SPRAYS IN [facility] NOSTRIL EVERY DAY 03/13/22   Ronnie Doss M, DO  furosemide (LASIX) 20 MG tablet Take 40mg  each morning,  20mg  each evening 02/08/21   Ronnie Doss M, DO  gabapentin (NEURONTIN) 600 MG tablet Take 1/2 tab every morning and 1 tablet every evening 08/01/20   Ronnie Doss M, DO  hydrOXYzine  (ATARAX/VISTARIL) 25 MG tablet Take 25-50 mg by mouth at bedtime. 01/14/19   [provider]  levothyroxine (SYNTHROID) 88 MCG tablet Take 1 tablet (88 mcg total) by mouth daily. 09/16/22   [name], DO  losartan (COZAAR) 25 MG tablet Take 12.5 mg by mouth daily.    [provider]  methocarbamol (ROBAXIN) 500 MG tablet Take 500 mg by mouth 2 (two) times daily as needed.    [provider]  metoprolol succinate (TOPROL-XL) 100 MG 24 hr tablet Take 1 tablet every morning and take 1/2 tablet every evening-Take with or immediately following a meal. 09/16/22   Troy Sine, MD  omega-3 acid ethyl esters (LOVAZA) 1 g capsule Take 2 capsules (2 g total)  by mouth 2 (two) times daily. 09/18/22   Troy Sine, MD  oxyCODONE-acetaminophen (PERCOCET) 7.5-325 MG tablet Take 1 tablet by mouth every 8 (eight) hours as needed. 10/29/17   [provider]  pantoprazole (PROTONIX) 40 MG tablet Take 1 tablet (40 mg total) by mouth daily. 09/16/22   [name], DO    Family History    Family History  Problem Relation Age of Onset   Rheum arthritis Mother    Osteoporosis Mother    Arthritis Mother    Depression Mother    Varicose Veins Mother    Heart disease Father    Diabetes Father    Hyperlipidemia Father    Bladder Cancer Father    Cancer Father    Depression Father    Hypertension Father    Obesity Father    Heart disease Brother    Arthritis Brother    Alcohol abuse Brother    Drug abuse Brother    Depression Maternal Grandmother    She indicated that her mother is deceased. She indicated that her father is deceased. She indicated that both of her brothers are alive. She indicated that the status of her maternal grandmother is unknown. She indicated that her daughter is alive. She indicated that her son is alive.  Social History    Social History   Socioeconomic History   Marital status: Married    Spouse name: Minerva Fester   Number of  children: 2   Years of education: 12   Highest education level: High school graduate  Occupational History   Occupation: disabled   Tobacco Use   Smoking status: Former    Packs/day: 1.00    Years: 20.00    Total pack years: 20.00    Types: Cigarettes    Quit date: 12/24/2003    Years since quitting: 18.7   Smokeless tobacco: Never   Tobacco comments:    Stopped on May 07,2005 before back surgery. Haven't smoked sence.  Vaping Use   Vaping Use: Never used  Substance and Sexual Activity   Alcohol use: No   Drug use: Not Currently    Frequency: 1.0 times per week    Types: Marijuana    Comment: boils marijuana in water and makes a tea about 3 times a week   Sexual activity: Not Currently    Birth control/protection: Surgical    Comment: Tubes tied 1973  Other Topics Concern   Not on file  Social History Narrative   Not on file   Social Determinants of Health   Financial Resource Strain: Medium Risk (08/27/2021)   Overall Financial Resource Strain (CARDIA)    Difficulty of Paying Living Expenses: Somewhat hard  Food Insecurity: Food Insecurity Present (08/27/2021)   Hunger Vital Sign    Worried About Running Out of Food in the Last Year: Sometimes true    Ran Out of Food in the Last Year: Sometimes true  Transportation Needs: No Transportation Needs (08/27/2021)   PRAPARE - Hydrologist (Medical): No    Lack of Transportation (Non-Medical): No  Physical Activity: Insufficiently Active (08/27/2021)   Exercise Vital Sign    Days of Exercise per Week: 5 days    Minutes of Exercise per Session: 10 min  Stress: No Stress Concern Present (08/27/2021)   [place]    Feeling of Stress : Only a little  Social Connections: Moderately Isolated (08/27/2021)   Social Connection and  Isolation Panel [NHANES]    Frequency of Communication with Friends and Family: Once a week    Frequency of Social  Gatherings with Friends and Family: Once a week    Attends Religious Services: 1 to 4 times per year    Active Member of [employer] or Organizations: No    Attends Archivist Meetings: Never    Marital Status: Married  Human resources officer Violence: Not At Risk (08/27/2021)   Humiliation, Afraid, Rape, and Kick questionnaire    Fear of Current or Ex-Partner: No    Emotionally Abused: No    Physically Abused: No    Sexually Abused: No     Review of Systems    General:  No chills, fever, night sweats or weight changes.  Cardiovascular:  No chest pain, dyspnea on exertion, edema, orthopnea, palpitations, paroxysmal nocturnal dyspnea. Dermatological: No rash, lesions/masses Respiratory: No cough, dyspnea Urologic: No hematuria, dysuria Abdominal:   No nausea, vomiting, diarrhea, bright red blood per rectum, melena, or hematemesis Neurologic:  No visual changes, wkns, changes in mental status. All other systems reviewed and are otherwise negative except as noted above.  Physical Exam    VS:  BP (!) 130/56   Pulse 64   Ht 5\' 5"  (1.651 m)   Wt 218 lb (98.9 kg)   SpO2 99%   BMI 36.28 kg/m  , BMI Body mass index is 36.28 kg/m. GEN: Well nourished, well developed, in no acute distress. HEENT: normal. Neck: Supple, no JVD, carotid bruits, or masses. Cardiac: RRR, no murmurs, rubs, or gallops. No clubbing, cyanosis, edema.  Radials/DP/PT 2+ and equal bilaterally.  Respiratory:  Respirations regular and unlabored, clear to auscultation bilaterally. GI: Soft, nontender, nondistended, BS + x 4. MS: no deformity or atrophy. Skin: warm and dry, no rash. Neuro:  Strength and sensation are intact. Psych: Normal affect.  Accessory Clinical Findings    Recent Labs: No results found for requested labs within last 365 days.   Recent Lipid Panel    Component Value Date/Time   CHOL 156 01/04/2021 1551   TRIG 156 (H) 01/04/2021 1551   HDL 58 01/04/2021 1551   CHOLHDL 2.7 01/04/2021 1551    CHOLHDL 3.0 05/12/2015 1459   VLDL 30 05/12/2015 1459   LDLCALC 72 01/04/2021 1551         ECG personally reviewed by me today-sinus rhythm with first-degree AV block incomplete right bundle branch block nonspecific ST and T wave abnormality 64 bpm- No acute changes  Echocardiogram 04/12/2020   IMPRESSIONS     1. Left ventricular ejection fraction, by estimation, is 60 to 65%. The  left ventricle has normal function. The left ventricle has no regional  wall motion abnormalities. There is mild left ventricular hypertrophy.  Left ventricular diastolic parameters  are consistent with Grade I diastolic dysfunction (impaired relaxation).   2. Right ventricular systolic function is normal. The right ventricular  size is normal.   3. The mitral valve is normal in structure. No evidence of mitral valve  regurgitation. No evidence of mitral stenosis.   4. The aortic valve has an indeterminant number of cusps. Aortic valve  regurgitation is not visualized. No aortic stenosis is present.   5. The inferior vena cava is normal in size with greater than 50%  respiratory variability, suggesting right atrial pressure of 3 mmHg.   FINDINGS   Left Ventricle: Left ventricular ejection fraction, by estimation, is 60  to 65%. The left ventricle has normal function. The left ventricle  has no  regional wall motion abnormalities. The left ventricular internal cavity  size was normal in size. There is   mild left ventricular hypertrophy. Left ventricular diastolic parameters  are consistent with Grade I diastolic dysfunction (impaired relaxation).   Right Ventricle: The right ventricular size is normal. No increase in  right ventricular wall thickness. Right ventricular systolic function is  normal.   Left Atrium: Left atrial size was normal in size.   Right Atrium: Right atrial size was normal in size.   Pericardium: There is no evidence of pericardial effusion.   Mitral Valve: The mitral  valve is normal in structure. No evidence of  mitral valve regurgitation. No evidence of mitral valve stenosis.   Tricuspid Valve: The tricuspid valve is normal in structure. Tricuspid  valve regurgitation is not demonstrated. No evidence of tricuspid  stenosis.   Aortic Valve: The aortic valve has an indeterminant number of cusps.  Aortic valve regurgitation is not visualized. No aortic stenosis is  present. Aortic valve mean gradient measures 5.3 mmHg. Aortic valve peak  gradient measures 10.2 mmHg. Aortic valve  area, by VTI measures 2.58 cm.   Pulmonic Valve: The pulmonic valve was not well visualized. Pulmonic valve  regurgitation is not visualized. No evidence of pulmonic stenosis.   Aorta: The aortic root is normal in size and structure.   Venous: The inferior vena cava is normal in size with greater than 50%  respiratory variability, suggesting right atrial pressure of 3 mmHg.   IAS/Shunts: No atrial level shunt detected by color flow Doppler.   Assessment & Plan   1.  Essential hypertension-BP today 130/56 Continue diltiazem, furosemide, losartan, metoprolol Heart healthy low-sodium diet-salty 6 given Increase physical activity as tolerated-use for pedaling device 10 minutes daily and increase as tolerated   Lower extremity edema-stable.  Weight today 218 pounds Continue furosemide Elevate lower extremities when not active Lower extremity support stockings   Anxiety and depression-reports compliance with Cymbalta.  Mood stable Follows with PCP   Hyperlipidemia-LDL 72 on 01/04/2021 Continue atorvastatin Heart healthy low-sodium high-fiber diet Increase physical activity as tolerated Follows with PCP Request recent labs  CKD stage IV-creatinine 2.06 on 08/14/2021 Follows with nephrology Request recent labs  Disposition: Follow-up with Dr. Claiborne Billings or me in 1 year.   Jossie Ng. Amri Lien NP-C     09/26/2022, 2:12 PM [place] [address] Office [phone] Fax [phone]    I spent 14 minutes examining this patient, reviewing medications, and using patient centered shared decision making involving her cardiac care.  Prior to her visit I spent greater than 20 minutes reviewing her past medical history,  medications, and prior cardiac tests.

## 2022-09-26 ENCOUNTER — Encounter: Payer: Self-pay | Admitting: General Practice

## 2022-09-26 ENCOUNTER — Other Ambulatory Visit: Payer: Medicare Other

## 2022-09-26 ENCOUNTER — Ambulatory Visit: Payer: Medicare Other | Attending: General Practice | Admitting: General Practice

## 2022-09-26 VITALS — BP 130/56 | HR 64 | Ht 65.0 in | Wt 218.0 lb

## 2022-09-26 DIAGNOSIS — N184 Chronic kidney disease, stage 4 (severe): Secondary | ICD-10-CM

## 2022-09-26 DIAGNOSIS — R600 Localized edema: Secondary | ICD-10-CM

## 2022-09-26 DIAGNOSIS — E782 Mixed hyperlipidemia: Secondary | ICD-10-CM | POA: Diagnosis not present

## 2022-09-26 DIAGNOSIS — E039 Hypothyroidism, unspecified: Secondary | ICD-10-CM

## 2022-09-26 DIAGNOSIS — F32A Depression, unspecified: Secondary | ICD-10-CM

## 2022-09-26 DIAGNOSIS — M810 Age-related osteoporosis without current pathological fracture: Secondary | ICD-10-CM

## 2022-09-26 DIAGNOSIS — I10 Essential (primary) hypertension: Secondary | ICD-10-CM | POA: Diagnosis not present

## 2022-09-26 DIAGNOSIS — F419 Anxiety disorder, unspecified: Secondary | ICD-10-CM | POA: Diagnosis not present

## 2022-09-26 MED ORDER — LOSARTAN POTASSIUM 25 MG PO TABS: 12.5000 mg | ORAL_TABLET | Freq: Every day | ORAL | 3 refills | Status: DC

## 2022-09-26 MED ORDER — OMEGA-3-ACID ETHYL ESTERS 1 G PO CAPS: 2.0000 g | ORAL_CAPSULE | Freq: Two times a day (BID) | ORAL | 3 refills | Status: AC

## 2022-09-26 MED ORDER — METOPROLOL SUCCINATE ER 100 MG PO TB24: ORAL_TABLET | ORAL | 3 refills | Status: DC

## 2022-09-26 MED ORDER — DILTIAZEM HCL ER COATED BEADS 120 MG PO CP24: 120.0000 mg | ORAL_CAPSULE | Freq: Every day | ORAL | 3 refills | Status: DC

## 2022-09-26 MED ORDER — FUROSEMIDE 20 MG PO TABS: ORAL_TABLET | ORAL | 1 refills | Status: DC

## 2022-09-26 MED ORDER — ATORVASTATIN CALCIUM 40 MG PO TABS: 40.0000 mg | ORAL_TABLET | Freq: Every day | ORAL | 3 refills | Status: DC

## 2022-09-26 NOTE — Patient Instructions (Addendum)
Medication Instructions:   Your physician recommends that you continue on your current medications as directed. Please refer to the Current Medication list given to you today.  *If you need a refill on your cardiac medications before your next appointment, please call your pharmacy*  Lab Work: NONE ordered at this time of appointment   If you have labs (blood work) drawn today and your tests are completely normal, you will receive your results only by: [place] (if you have MyChart) OR A paper copy in the mail If you have any lab test that is abnormal or we need to change your treatment, we will call you to review the results.  Testing/Procedures: NONE ordered at this time of appointment   Follow-Up: At [facility], you and your health needs are our priority.  As part of our continuing mission to provide you with exceptional heart care, we have created designated Provider Care Teams.  These Care Teams include your primary Cardiologist (physician) and Advanced Practice Providers (APPs -  Physician Assistants and Nurse Practitioners) who all work together to provide you with the care you need, when you need it.  Your next appointment:   1 year(s)  Provider:   Shelva Majestic, MD     Other Instructions Coletta Memos, NP recommends that you use your stationary bike for at least 10 minutes a day or as tolerated.

## 2022-09-27 LAB — CMP14+EGFR
ALT: 12 IU/L (ref 0–32)
AST: 30 IU/L (ref 0–40)
Albumin/Globulin Ratio: 1.7 (ref 1.2–2.2)
Albumin: 3.9 g/dL (ref 3.8–4.8)
Alkaline Phosphatase: 55 IU/L (ref 44–121)
BUN/Creatinine Ratio: 20 (ref 12–28)
BUN: 39 mg/dL — ABNORMAL HIGH (ref 8–27)
Bilirubin Total: 0.4 mg/dL (ref 0.0–1.2)
CO2: 28 mmol/L (ref 20–29)
Calcium: 9.7 mg/dL (ref 8.7–10.3)
Chloride: 101 mmol/L (ref 96–106)
Creatinine, Ser: 1.95 mg/dL — ABNORMAL HIGH (ref 0.57–1.00)
Globulin, Total: 2.3 g/dL (ref 1.5–4.5)
Glucose: 106 mg/dL — ABNORMAL HIGH (ref 70–99)
Potassium: 5.3 mmol/L — ABNORMAL HIGH (ref 3.5–5.2)
Sodium: 143 mmol/L (ref 134–144)
Total Protein: 6.2 g/dL (ref 6.0–8.5)
eGFR: 27 mL/min/{1.73_m2} — ABNORMAL LOW (ref >59)

## 2022-09-27 LAB — CBC
Hematocrit: 34.1 % (ref 34.0–46.6)
Hemoglobin: 11.3 g/dL (ref 11.1–15.9)
MCH: 31.6 pg (ref 26.6–33.0)
MCHC: 33.1 g/dL (ref 31.5–35.7)
MCV: 95 fL (ref 79–97)
Platelets: 360 10*3/uL (ref 150–450)
RBC: 3.58 x10E6/uL — ABNORMAL LOW (ref 3.77–5.28)
RDW: 13.6 % (ref 11.7–15.4)
WBC: 8.1 10*3/uL (ref 3.4–10.8)

## 2022-09-27 LAB — VITAMIN D 25 HYDROXY (VIT D DEFICIENCY, FRACTURES): Vit D, 25-Hydroxy: 27.9 ng/mL — ABNORMAL LOW (ref 30.0–100.0)

## 2022-09-27 LAB — T4, FREE: Free T4: 1.84 ng/dL — ABNORMAL HIGH (ref 0.82–1.77)

## 2022-09-27 LAB — TSH: TSH: 1.32 u[IU]/mL (ref 0.450–4.500)

## 2022-10-02 ENCOUNTER — Ambulatory Visit (INDEPENDENT_AMBULATORY_CARE_PROVIDER_SITE_OTHER): Payer: Medicare Other

## 2022-10-02 VITALS — Ht 64.0 in | Wt 218.0 lb

## 2022-10-02 DIAGNOSIS — Z1211 Encounter for screening for malignant neoplasm of colon: Secondary | ICD-10-CM | POA: Diagnosis not present

## 2022-10-02 DIAGNOSIS — Z0000 Encounter for general adult medical examination without abnormal findings: Secondary | ICD-10-CM | POA: Diagnosis not present

## 2022-10-02 DIAGNOSIS — Z1231 Encounter for screening mammogram for malignant neoplasm of breast: Secondary | ICD-10-CM | POA: Diagnosis not present

## 2022-10-02 NOTE — Progress Notes (Signed)
Subjective:   Regina Horton is a 72 y.o. female who presents for Medicare Annual (Subsequent) preventive examination. I connected with  SAVREEN MAHONY on 10/02/22 by a audio enabled telemedicine application and verified that I am speaking with the correct person using two identifiers.  Patient Location: Home  Provider Location: Home Office  I discussed the limitations of evaluation and management by telemedicine. The patient expressed understanding and agreed to proceed.  Review of Systems     Cardiac Risk Factors include: advanced age (>19mn, >>17women);hypertension     Objective:    Today's Vitals   10/02/22 1535  Weight: 218 lb (98.9 kg)  Height: 5' 4"$  (1.626 m)   Body mass index is 37.42 kg/m.     10/02/2022    3:39 PM 08/27/2021    3:09 PM 08/24/2020    2:31 PM 08/07/2017    7:19 AM 01/10/2017    4:39 PM 12/23/2016    3:33 PM 07/17/2015    9:38 AM  Advanced Directives  Does Patient Have a Medical Advance Directive? No No No No No No No  Would patient like information on creating a medical advance directive? No - Patient declined No - Patient declined No - Patient declined Yes (MAU/Ambulatory/Procedural Areas - Information given)  Yes (MAU/Ambulatory/Procedural Areas - Information given) Yes - Educational materials given    Current Medications (verified) Outpatient Encounter Medications as of 10/02/2022  Medication Sig   atorvastatin (LIPITOR) 40 MG tablet Take 1 tablet (40 mg total) by mouth daily.   Carboxymethylcellul-Glycerin (REFRESH RELIEVA OP) Apply 1 drop to eye. 6 times per day   denosumab (PROLIA) 60 MG/ML SOSY injection Inject 60 mg into the skin every 6 (six) months.   diltiazem (CARDIZEM CD) 120 MG 24 hr capsule Take 1 capsule (120 mg total) by mouth daily.   DULoxetine (CYMBALTA) 60 MG capsule Take 1 capsule (60 mg total) by mouth daily.   fenofibrate 160 MG tablet Take 1 tablet (160 mg total) by mouth daily.   fluticasone (FLONASE) 50 MCG/ACT nasal  spray USE 2 SPRAYS IN EACH NOSTRIL EVERY DAY   furosemide (LASIX) 20 MG tablet Take 447meach morning,  208mach evening   gabapentin (NEURONTIN) 600 MG tablet Take 1/2 tab every morning and 1 tablet every evening   hydrOXYzine (ATARAX/VISTARIL) 25 MG tablet Take 25-50 mg by mouth at bedtime.   levothyroxine (SYNTHROID) 88 MCG tablet Take 1 tablet (88 mcg total) by mouth daily.   losartan (COZAAR) 25 MG tablet Take 0.5 tablets (12.5 mg total) by mouth daily.   methocarbamol (ROBAXIN) 500 MG tablet Take 500 mg by mouth 2 (two) times daily as needed.   metoprolol succinate (TOPROL-XL) 100 MG 24 hr tablet Take 1 tablet every morning and take 1/2 tablet every evening-Take with or immediately following a meal.   omega-3 acid ethyl esters (LOVAZA) 1 g capsule Take 2 capsules (2 g total) by mouth 2 (two) times daily.   oxyCODONE-acetaminophen (PERCOCET) 7.5-325 MG tablet Take 1 tablet by mouth every 8 (eight) hours as needed.   pantoprazole (PROTONIX) 40 MG tablet Take 1 tablet (40 mg total) by mouth daily.   Docusate Calcium (STOOL SOFTENER PO) Take by mouth.   Facility-Administered Encounter Medications as of 10/02/2022  Medication   denosumab (PROLIA) injection 60 mg    Allergies (verified) Levocetirizine, Xyzal [levocetirizine dihydrochloride], Codeine, and Nickel   History: Past Medical History:  Diagnosis Date   Allergic rhinitis    Allergy Don't remember  Nickel ears pierced when I was 14    Sinus meds 2017   Angina at rest    Anxiety    Aortic sclerosis    Arthritis Many years ago   Cataract 2016   CHF (congestive heart failure) ([place]) 2020   Dr Claiborne Billings found it.   Chronic back pain    Chronic constipation    CKD (chronic kidney disease)    Depression    Fibromyalgia    GERD (gastroesophageal reflux disease)    Hyperlipidemia    Hypertension    Hypertension    Mitral regurgitation    Osteoporosis    Palpitations    Thyroid disease    Tricuspid regurgitation    Past  Surgical History:  Procedure Laterality Date   BACK SURGERY  08/20/2003   BREAST LUMPECTOMY  08/19/1968   right    CATARACT EXTRACTION W/PHACO Right 06/15/2015   Procedure: CATARACT EXTRACTION PHACO AND INTRAOCULAR LENS PLACEMENT ([place]);  Surgeon: Tonny Branch, MD;  Location: [facility];  Service: Ophthalmology;  Laterality: Right;  CDE: 10.33   CATARACT EXTRACTION W/PHACO Left 07/17/2015   Procedure: CATARACT EXTRACTION PHACO AND INTRAOCULAR LENS PLACEMENT ([facility]);  Surgeon: Tonny Branch, MD;  Location: [facility];  Service: Ophthalmology;  Laterality: Left;  CDE:7.60   EYE SURGERY  2018   Cataracts   FRACTURE SURGERY  2016   Left ankle.   NM MYOCAR PERF WALL MOTION  02/16/2009   normal   ORIF ANKLE FRACTURE Left 10/26/2014   Procedure: OPEN REDUCTION INTERNAL FIXATION (ORIF) ANKLE FRACTURE;  Surgeon: Sanjuana Kava, MD;  Location: [facility];  Service: Orthopedics;  Laterality: Left;   SPINE SURGERY  2005   Fusion with hardware   TONSILLECTOMY  childhood    TUBAL LIGATION  08/20/1971   US ECHOCARDIOGRAPHY  12/19/2008   mild mitral annular ca+,AOV mildly sclerotic,mild MR & TR   Family History  Problem Relation Age of Onset   Rheum arthritis Mother    Osteoporosis Mother    Arthritis Mother    Depression Mother    Varicose Veins Mother    Heart disease Father    Diabetes Father    Hyperlipidemia Father    Bladder Cancer Father    Cancer Father    Depression Father    Hypertension Father    Obesity Father    Heart disease Brother    Arthritis Brother    Alcohol abuse Brother    Drug abuse Brother    Depression Maternal Grandmother    Social History   Socioeconomic History   Marital status: Married    Spouse name: Minerva Fester   Number of children: 2   Years of education: 12   Highest education level: High school graduate  Occupational History   Occupation: disabled   Tobacco Use   Smoking status: Former    Packs/day: 1.00    Years: 20.00    Total pack years: 20.00    Types: Cigarettes     Quit date: 12/24/2003    Years since quitting: 18.7   Smokeless tobacco: Never   Tobacco comments:    Stopped on May 07,2005 before back surgery. Haven't smoked sence.  Vaping Use   Vaping Use: Never used  Substance and Sexual Activity   Alcohol use: No   Drug use: Not Currently    Frequency: 1.0 times per week    Types: Marijuana    Comment: boils marijuana in water and makes a tea about 3 times a week   Sexual activity:  Not Currently    Birth control/protection: Surgical    Comment: Tubes tied 1973  Other Topics Concern   Not on file  Social History Narrative   Not on file   Social Determinants of Health   Financial Resource Strain: Low Risk  (10/02/2022)   Overall Financial Resource Strain (CARDIA)    Difficulty of Paying Living Expenses: Not hard at all  Food Insecurity: No Food Insecurity (10/02/2022)   Hunger Vital Sign    Worried About Running Out of Food in the Last Year: Never true    Ran Out of Food in the Last Year: Never true  Transportation Needs: No Transportation Needs (10/02/2022)   PRAPARE - Hydrologist (Medical): No    Lack of Transportation (Non-Medical): No  Physical Activity: Insufficiently Active (10/02/2022)   Exercise Vital Sign    Days of Exercise per Week: 3 days    Minutes of Exercise per Session: 30 min  Stress: No Stress Concern Present (10/02/2022)   [place]    Feeling of Stress : Not at all  Social Connections: Moderately Isolated (10/02/2022)   Social Connection and Isolation Panel [NHANES]    Frequency of Communication with Friends and Family: More than three times a week    Frequency of Social Gatherings with Friends and Family: More than three times a week    Attends Religious Services: Never    Marine scientist or Organizations: No    Attends Archivist Meetings: Never    Marital Status: Married    Tobacco  Counseling Counseling given: Not Answered Tobacco comments: Stopped on May 07,2005 before back surgery. Haven't smoked sence.   Clinical Intake:  Pre-visit preparation completed: Yes  Pain : No/denies pain     Nutritional Risks: None Diabetes: No  How often do you need to have someone help you when you read instructions, pamphlets, or other written materials from your doctor or pharmacy?: 1 - Never  Diabetic?no   Interpreter Needed?: No  Information entered by :: Jadene Pierini, LPN   Activities of Daily Living    10/02/2022    3:39 PM 09/29/2022    7:03 PM  In your present state of health, do you have any difficulty performing the following activities:  Hearing? 0 0  Vision? 0 0  Difficulty concentrating or making decisions? 0 0  Walking or climbing stairs? 0 1  Dressing or bathing? 0 1  Doing errands, shopping? 0 1  Preparing Food and eating ? N Y  Using the Toilet? N N  In the past six months, have you accidently leaked urine? N Y  Do you have problems with loss of bowel control? N N  Managing your Medications? N N  Managing your Finances? N N  Housekeeping or managing your Housekeeping? N Y    Patient Care Team: [name], DO as PCP - General (Family Medicine) Troy Sine, MD as PCP - Cardiology (Cardiology) Rogene Houston, MD as Consulting Physician (Gastroenterology) Jonnie Kind, MD (Inactive) as Consulting Physician (Obstetrics and Gynecology) Renato Shin, MD (Inactive) as Consulting Physician (Endocrinology) Troy Sine, MD as Consulting Physician (Cardiology) Fran Lowes, MD (Inactive) as Consulting Physician (Nephrology) Netta Cedars, MD as Consulting Physician (Orthopedic Surgery)  Indicate any recent Medical Services you may have received from other than [facility] providers in the past year (date may be approximate).     Assessment:   This is  a routine wellness examination for Keilei.  Hearing/Vision screen Vision  Screening - Comments:: Wears rx glasses - up to date with routine eye exams with  Dr.Johnson   Dietary issues and exercise activities discussed: Current Exercise Habits: Home exercise routine, Type of exercise: walking, Time (Minutes): 30, Frequency (Times/Week): 3, Weekly Exercise (Minutes/Week): 90, Intensity: Mild, Exercise limited by: None identified   Goals Addressed             This Visit's Progress    Exercise 3x per week (30 min per time)   On track    Recommend starting a routine exercise program at least 3 days a week for 30-45 minutes at a time as tolerated.         Depression Screen    10/02/2022    3:38 PM 08/27/2021    3:07 PM 08/14/2021    4:10 PM 02/08/2021    4:34 PM 08/24/2020    2:31 PM 12/15/2019    3:56 PM 11/18/2017    2:21 PM  PHQ 2/9 Scores  PHQ - 2 Score 0 0 0 1 0 0 2  PHQ- 9 Score      0 5    Fall Risk    10/02/2022    3:36 PM 09/29/2022    7:03 PM 08/27/2021    2:51 PM 08/24/2021   12:27 AM 08/14/2021    4:10 PM  Fall Risk   Falls in the past year? 1 1 0 0 0  Number falls in past yr: 1 1 0 0   Injury with Fall? 1 1 0 0   Risk for fall due to : History of fall(s);Impaired balance/gait;Orthopedic patient  Impaired balance/gait;Medication side effect;Orthopedic patient    Follow up Education provided;Falls prevention discussed  Education provided;Falls prevention discussed      FALL RISK PREVENTION PERTAINING TO THE HOME:  Any stairs in or around the home? Yes  If so, are there any without handrails? No  Home free of loose throw rugs in walkways, pet beds, electrical cords, etc? Yes  Adequate lighting in your home to reduce risk of falls? Yes   ASSISTIVE DEVICES UTILIZED TO PREVENT FALLS:  Life alert? No  Use of a cane, walker or w/c? Yes  Grab bars in the bathroom? No  Shower chair or bench in shower? No  Elevated toilet seat or a handicapped toilet? No       10/02/2022    3:40 PM 08/27/2021    3:21 PM 08/24/2020    2:36 PM 12/23/2016    3:42  PM  6CIT Screen  What Year? 0 points 0 points 0 points 0 points  What month? 0 points 0 points 0 points 0 points  What time? 0 points 0 points 0 points 0 points  Count back from 20 0 points 0 points 0 points 0 points  Months in reverse 0 points 0 points 0 points 0 points  Repeat phrase 0 points 2 points 2 points 0 points  Total Score 0 points 2 points 2 points 0 points    Immunizations Immunization History  Administered Date(s) Administered   Influenza Whole 05/02/2010   Influenza, High Dose Seasonal PF 07/17/2017, 08/15/2017, 06/22/2018   Influenza,inj,Quad PF,6+ Mos 07/07/2014, 05/16/2015, 06/12/2016   Influenza-Unspecified 07/18/2017, 07/01/2018, 06/23/2020, 06/20/2021, 08/01/2022   Moderna Sars-Covid-2 Vaccination 10/01/2019, 10/29/2019   PFIZER(Purple Top)SARS-COV-2 Vaccination 06/22/2020, 11/20/2020, 06/20/2021   Pneumococcal Conjugate-13 01/09/2015   Pneumococcal Polysaccharide-23 08/19/1998, 02/07/2011, 06/12/2016   Td 08/07/2010   Zoster, Live 02/15/2011  TDAP status: Up to date  Flu Vaccine status: Up to date  Pneumococcal vaccine status: Up to date  Covid-19 vaccine status: Completed vaccines  Qualifies for Shingles Vaccine? Yes   Zostavax completed Yes   Shingrix Completed?: Yes  Screening Tests Health Maintenance  Topic Date Due   Zoster Vaccines- Shingrix (1 of 2) Never done   COLONOSCOPY (Pts 45-83yr Insurance coverage will need to be confirmed)  09/19/2013   DTaP/Tdap/Td (2 - Tdap) 08/07/2020   MAMMOGRAM  12/28/2021   COVID-19 Vaccine (6 - 2023-24 season) 04/19/2022   Medicare Annual Wellness (AWV)  10/03/2023   Pneumonia Vaccine 72 Years old  Completed   INFLUENZA VACCINE  Completed   DEXA SCAN  Completed   Hepatitis C Screening  Completed   HPV VACCINES  Aged Out    Health Maintenance  Health Maintenance Due  Topic Date Due   Zoster Vaccines- Shingrix (1 of 2) Never done   COLONOSCOPY (Pts 45-468yrInsurance coverage will need to be  confirmed)  09/19/2013   DTaP/Tdap/Td (2 - Tdap) 08/07/2020   MAMMOGRAM  12/28/2021   COVID-19 Vaccine (6 - 2023-24 season) 04/19/2022    Colorectal cancer screening: Referral to GI placed 10/02/2022. Pt aware the office will call re: appt.  Mammogram status: Ordered 10/02/2022. Pt provided with contact info and advised to call to schedule appt.   Bone Density status: Completed 11/19/2017. Results reflect: Bone density results: OSTEOPENIA. Repeat every 5 years.  Lung Cancer Screening: (Low Dose CT Chest recommended if Age 74-80ears, 30 pack-year currently smoking OR have quit w/in 15years.) does not qualify.   Lung Cancer Screening Referral: n/a  Additional Screening:  Hepatitis C Screening: does not qualify; Completed 09/28/2015  Vision Screening: Recommended annual ophthalmology exams for early detection of glaucoma and other disorders of the eye. Is the patient up to date with their annual eye exam?  Yes  Who is the provider or what is the name of the office in which the patient attends annual eye exams? Dr.Johnson  If pt is not established with a provider, would they like to be referred to a provider to establish care? No .   Dental Screening: Recommended annual dental exams for proper oral hygiene  Community Resource Referral / Chronic Care Management: CRR required this visit?  No   CCM required this visit?  No      Plan:     I have personally reviewed and noted the following in the patient's chart:   Medical and social history Use of alcohol, tobacco or illicit drugs  Current medications and supplements including opioid prescriptions. Patient is not currently taking opioid prescriptions. Functional ability and status Nutritional status Physical activity Advanced directives List of other physicians Hospitalizations, surgeries, and ER visits in previous 12 months Vitals Screenings to include cognitive, depression, and falls Referrals and appointments  In  addition, I have reviewed and discussed with patient certain preventive protocols, quality metrics, and best practice recommendations. A written personalized care plan for preventive services as well as general preventive health recommendations were provided to patient.     LaDaphane ShepherdLPN   [id number] Nurse Notes: none

## 2022-10-02 NOTE — Patient Instructions (Signed)
Regina Horton , Thank you for taking time to come for your Medicare Wellness Visit. I appreciate your ongoing commitment to your health goals. Please review the following plan we discussed and let me know if I can assist you in the future.   These are the goals we discussed:  Goals       Exercise 3x per week (30 min per time)      Recommend starting a routine exercise program at least 3 days a week for 30-45 minutes at a time as tolerated.        Prevent falls (pt-stated)      "I would like to walk more to strengthen my legs so I don't fall"        This is a list of the screening recommended for you and due dates:  Health Maintenance  Topic Date Due   Zoster (Shingles) Vaccine (1 of 2) Never done   Colon Cancer Screening  09/19/2013   DTaP/Tdap/Td vaccine (2 - Tdap) 08/07/2020   Mammogram  12/28/2021   COVID-19 Vaccine (6 - 2023-24 season) 04/19/2022   Medicare Annual Wellness Visit  10/03/2023   Pneumonia Vaccine  Completed   Flu Shot  Completed   DEXA scan (bone density measurement)  Completed   Hepatitis C Screening: USPSTF Recommendation to screen - Ages 18-79 yo.  Completed   HPV Vaccine  Aged Out    Advanced directives: Advance directive discussed with you today. I have provided a copy for you to complete at home and have notarized. Once this is complete please bring a copy in to our office so we can scan it into your chart.   Conditions/risks identified: Aim for 30 minutes of exercise or brisk walking, 6-8 glasses of water, and 5 servings of fruits and vegetables each day.   Next appointment: Follow up in one year for your annual wellness visit    Preventive Care 65 Years and Older, Female Preventive care refers to lifestyle choices and visits with your health care provider that can promote health and wellness. What does preventive care include? A yearly physical exam. This is also called an annual well check. Dental exams once or twice a year. Routine eye exams. Ask  your health care provider how often you should have your eyes checked. Personal lifestyle choices, including: Daily care of your teeth and gums. Regular physical activity. Eating a healthy diet. Avoiding tobacco and drug use. Limiting alcohol use. Practicing safe sex. Taking low-dose aspirin every day. Taking vitamin and mineral supplements as recommended by your health care provider. What happens during an annual well check? The services and screenings done by your health care provider during your annual well check will depend on your age, overall health, lifestyle risk factors, and family history of disease. Counseling  Your health care provider may ask you questions about your: Alcohol use. Tobacco use. Drug use. Emotional well-being. Home and relationship well-being. Sexual activity. Eating habits. History of falls. Memory and ability to understand (cognition). Work and work Statistician. Reproductive health. Screening  You may have the following tests or measurements: Height, weight, and BMI. Blood pressure. Lipid and cholesterol levels. These may be checked every 5 years, or more frequently if you are over 20 years old. Skin check. Lung cancer screening. You may have this screening every year starting at age 49 if you have a 30-pack-year history of smoking and currently smoke or have quit within the past 15 years. Fecal occult blood test (FOBT) of the stool. You  may have this test every year starting at age 100. Flexible sigmoidoscopy or colonoscopy. You may have a sigmoidoscopy every 5 years or a colonoscopy every 10 years starting at age 33. Hepatitis C blood test. Hepatitis B blood test. Sexually transmitted disease (STD) testing. Diabetes screening. This is done by checking your blood sugar (glucose) after you have not eaten for a while (fasting). You may have this done every 1-3 years. Bone density scan. This is done to screen for osteoporosis. You may have this done  starting at age 65. Mammogram. This may be done every 1-2 years. Talk to your health care provider about how often you should have regular mammograms. Talk with your health care provider about your test results, treatment options, and if necessary, the need for more tests. Vaccines  Your health care provider may recommend certain vaccines, such as: Influenza vaccine. This is recommended every year. Tetanus, diphtheria, and acellular pertussis (Tdap, Td) vaccine. You may need a Td booster every 10 years. Zoster vaccine. You may need this after age 108. Pneumococcal 13-valent conjugate (PCV13) vaccine. One dose is recommended after age 48. Pneumococcal polysaccharide (PPSV23) vaccine. One dose is recommended after age 62. Talk to your health care provider about which screenings and vaccines you need and how often you need them. This information is not intended to replace advice given to you by your health care provider. Make sure you discuss any questions you have with your health care provider. Document Released: 09/01/2015 Document Revised: 04/24/2016 Document Reviewed: 06/06/2015 Elsevier Interactive Patient Education  2017 [place] Prevention in the Home Falls can cause injuries. They can happen to people of all ages. There are many things you can do to make your home safe and to help prevent falls. What can I do on the outside of my home? Regularly fix the edges of walkways and driveways and fix any cracks. Remove anything that might make you trip as you walk through a door, such as a raised step or threshold. Trim any bushes or trees on the path to your home. Use bright outdoor lighting. Clear any walking paths of anything that might make someone trip, such as rocks or tools. Regularly check to see if handrails are loose or broken. Make sure that both sides of any steps have handrails. Any raised decks and porches should have guardrails on the edges. Have any leaves, snow, or  ice cleared regularly. Use sand or salt on walking paths during winter. Clean up any spills in your garage right away. This includes oil or grease spills. What can I do in the bathroom? Use night lights. Install grab bars by the toilet and in the tub and shower. Do not use towel bars as grab bars. Use non-skid mats or decals in the tub or shower. If you need to sit down in the shower, use a plastic, non-slip stool. Keep the floor dry. Clean up any water that spills on the floor as soon as it happens. Remove soap buildup in the tub or shower regularly. Attach bath mats securely with double-sided non-slip rug tape. Do not have throw rugs and other things on the floor that can make you trip. What can I do in the bedroom? Use night lights. Make sure that you have a light by your bed that is easy to reach. Do not use any sheets or blankets that are too big for your bed. They should not hang down onto the floor. Have a firm chair that has side  arms. You can use this for support while you get dressed. Do not have throw rugs and other things on the floor that can make you trip. What can I do in the kitchen? Clean up any spills right away. Avoid walking on wet floors. Keep items that you use a lot in easy-to-reach places. If you need to reach something above you, use a strong step stool that has a grab bar. Keep electrical cords out of the way. Do not use floor polish or wax that makes floors slippery. If you must use wax, use non-skid floor wax. Do not have throw rugs and other things on the floor that can make you trip. What can I do with my stairs? Do not leave any items on the stairs. Make sure that there are handrails on both sides of the stairs and use them. Fix handrails that are broken or loose. Make sure that handrails are as long as the stairways. Check any carpeting to make sure that it is firmly attached to the stairs. Fix any carpet that is loose or worn. Avoid having throw rugs at  the top or bottom of the stairs. If you do have throw rugs, attach them to the floor with carpet tape. Make sure that you have a light switch at the top of the stairs and the bottom of the stairs. If you do not have them, ask someone to add them for you. What else can I do to help prevent falls? Wear shoes that: Do not have high heels. Have rubber bottoms. Are comfortable and fit you well. Are closed at the toe. Do not wear sandals. If you use a stepladder: Make sure that it is fully opened. Do not climb a closed stepladder. Make sure that both sides of the stepladder are locked into place. Ask someone to hold it for you, if possible. Clearly mark and make sure that you can see: Any grab bars or handrails. First and last steps. Where the edge of each step is. Use tools that help you move around (mobility aids) if they are needed. These include: Canes. Walkers. Scooters. Crutches. Turn on the lights when you go into a dark area. Replace any light bulbs as soon as they burn out. Set up your furniture so you have a clear path. Avoid moving your furniture around. If any of your floors are uneven, fix them. If there are any pets around you, be aware of where they are. Review your medicines with your doctor. Some medicines can make you feel dizzy. This can increase your chance of falling. Ask your doctor what other things that you can do to help prevent falls. This information is not intended to replace advice given to you by your health care provider. Make sure you discuss any questions you have with your health care provider. Document Released: 06/01/2009 Document Revised: 01/11/2016 Document Reviewed: 09/09/2014 Elsevier Interactive Patient Education  2017 [employer].

## 2022-10-03 ENCOUNTER — Encounter (INDEPENDENT_AMBULATORY_CARE_PROVIDER_SITE_OTHER): Payer: Self-pay | Admitting: *Deleted

## 2022-10-04 ENCOUNTER — Other Ambulatory Visit: Payer: Self-pay | Admitting: Family Medicine

## 2022-10-04 NOTE — Telephone Encounter (Signed)
Patient aware that she is due for prolia and will call to schedule.

## 2022-10-08 ENCOUNTER — Encounter: Payer: Self-pay | Admitting: Family Medicine

## 2022-11-06 ENCOUNTER — Encounter: Payer: Medicare Other | Admitting: Family Medicine

## 2022-11-07 ENCOUNTER — Telehealth (INDEPENDENT_AMBULATORY_CARE_PROVIDER_SITE_OTHER): Payer: Self-pay | Admitting: Gastroenterology

## 2022-11-07 NOTE — Telephone Encounter (Signed)
Left message to return call  (2 day prep-clear liquids all day instead of starting at 2 pm)

## 2022-11-07 NOTE — Telephone Encounter (Signed)
Who is your primary care physician: Drema Pry   Reasons for the colonoscopy: Recall  Have you had a colonoscopy before?  Yes about 20 years ago  Do you have family history of colon cancer? Yes grandmother  Previous colonoscopy with polyps removed? No  Do you have a history colorectal cancer?   No  Are you diabetic? If yes, Type 1 or Type 2?    No  Do you have a prosthetic or mechanical heart valve? No  Do you have a pacemaker/defibrillator?   No  Have you had endocarditis/atrial fibrillation? Yes irregular heartbeat  Have you had joint replacement within the last 12 months?  No  Do you tend to be constipated or have to use laxatives? No  Do you have any history of drugs or alchohol?  No  Do you use supplemental oxygen?  No  Have you had a stroke or heart attack within the last 6 months?No  Do you take weight loss medication? No  For female patients: have you had a hysterectomy?  No                                     are you post menopausal?       Yes                                            do you still have your menstrual cycle? No      Do you take any blood-thinning medications such as: (aspirin, warfarin, Plavix, Aggrenox)  No  If yes we need the name, milligram, dosage and who is prescribing doctor  Current Outpatient Medications on File Prior to Visit  Medication Sig Dispense Refill   atorvastatin (LIPITOR) 40 MG tablet Take 1 tablet (40 mg total) by mouth daily. 90 tablet 3   Carboxymethylcellul-Glycerin (REFRESH RELIEVA OP) Apply 1 drop to eye. 6 times per day     cyclobenzaprine (FLEXERIL) 5 MG tablet Take 5 mg by mouth 3 (three) times daily.     denosumab (PROLIA) 60 MG/ML SOSY injection Inject 60 mg into the skin every 6 (six) months. 180 mL 0   diltiazem (CARDIZEM CD) 120 MG 24 hr capsule Take 1 capsule (120 mg total) by mouth daily. 90 capsule 3   DULoxetine (CYMBALTA) 60 MG capsule Take 1 capsule (60 mg total) by mouth daily. 90 capsule 3    fenofibrate 160 MG tablet Take 1 tablet (160 mg total) by mouth daily. 30 tablet 0   fluticasone (FLONASE) 50 MCG/ACT nasal spray USE 2 SPRAYS IN EACH NOSTRIL EVERY DAY 48 g 1   furosemide (LASIX) 20 MG tablet Take 40mg  each morning,  20mg  each evening 90 tablet 1   gabapentin (NEURONTIN) 600 MG tablet Take 1/2 tab every morning and 1 tablet every evening 135 tablet 3   hydrOXYzine (ATARAX/VISTARIL) 25 MG tablet Take 25-50 mg by mouth at bedtime.     levothyroxine (SYNTHROID) 88 MCG tablet Take 1 tablet (88 mcg total) by mouth daily. 90 tablet 3   losartan (COZAAR) 25 MG tablet Take 0.5 tablets (12.5 mg total) by mouth daily. 45 tablet 3   metoprolol succinate (TOPROL-XL) 100 MG 24 hr tablet Take 1 tablet every morning and take 1/2 tablet every evening-Take with or immediately following a meal. 135 tablet  3   omega-3 acid ethyl esters (LOVAZA) 1 g capsule Take 2 capsules (2 g total) by mouth 2 (two) times daily. 360 capsule 3   oxyCODONE-acetaminophen (PERCOCET) 7.5-325 MG tablet Take 1 tablet by mouth every 8 (eight) hours as needed.     pantoprazole (PROTONIX) 40 MG tablet Take 1 tablet (40 mg total) by mouth daily. 90 tablet 3   Current Facility-Administered Medications on File Prior to Visit  Medication Dose Route Frequency Provider Last Rate Last Admin   denosumab (PROLIA) injection 60 mg  60 mg Subcutaneous Q6 months [name], Ashly M, DO   60 mg at 03/18/22 1516    Allergies  Allergen Reactions   Levocetirizine Hives and Itching   Xyzal [Levocetirizine Dihydrochloride] Hives and Itching   Codeine     Pt states she does not have any problems with codeine.   Nickel Rash     Pharmacy: [facility]  Primary Insurance Name: [employer] number where you can be reached: [phone] or [phone]

## 2022-11-08 NOTE — Telephone Encounter (Signed)
Left message with husband to get pt to return call

## 2022-11-12 NOTE — Telephone Encounter (Signed)
Letter mailed to patient.

## 2022-12-18 ENCOUNTER — Ambulatory Visit: Payer: Medicare Other

## 2022-12-30 ENCOUNTER — Ambulatory Visit (INDEPENDENT_AMBULATORY_CARE_PROVIDER_SITE_OTHER): Payer: Medicare Other

## 2022-12-30 DIAGNOSIS — M810 Age-related osteoporosis without current pathological fracture: Secondary | ICD-10-CM | POA: Diagnosis not present

## 2022-12-30 NOTE — Progress Notes (Signed)
Prolia injection given to left upper arm Patient tolerated well Patient supplied 

## 2023-02-16 ENCOUNTER — Other Ambulatory Visit: Payer: Self-pay | Admitting: General Practice

## 2023-02-16 ENCOUNTER — Other Ambulatory Visit: Payer: Self-pay | Admitting: Cardiovascular Disease

## 2023-03-25 ENCOUNTER — Encounter (INDEPENDENT_AMBULATORY_CARE_PROVIDER_SITE_OTHER): Payer: Self-pay | Admitting: *Deleted

## 2023-03-25 NOTE — Progress Notes (Signed)
Please call pt and make sure they complete forms for GI doctor.  They are going to close her referral if she does not

## 2023-04-15 ENCOUNTER — Encounter: Payer: Self-pay | Admitting: Family Medicine

## 2023-04-15 ENCOUNTER — Ambulatory Visit (INDEPENDENT_AMBULATORY_CARE_PROVIDER_SITE_OTHER): Payer: Medicare Other | Admitting: Family Medicine

## 2023-04-15 ENCOUNTER — Telehealth: Payer: Self-pay | Admitting: Family Medicine

## 2023-04-15 VITALS — BP 134/86 | HR 60 | Temp 98.7°F | Ht 64.0 in | Wt 211.0 lb

## 2023-04-15 DIAGNOSIS — E039 Hypothyroidism, unspecified: Secondary | ICD-10-CM

## 2023-04-15 DIAGNOSIS — Z0001 Encounter for general adult medical examination with abnormal findings: Secondary | ICD-10-CM | POA: Diagnosis not present

## 2023-04-15 DIAGNOSIS — Z0000 Encounter for general adult medical examination without abnormal findings: Secondary | ICD-10-CM

## 2023-04-15 DIAGNOSIS — H6992 Unspecified Eustachian tube disorder, left ear: Secondary | ICD-10-CM

## 2023-04-15 DIAGNOSIS — G8929 Other chronic pain: Secondary | ICD-10-CM

## 2023-04-15 DIAGNOSIS — N184 Chronic kidney disease, stage 4 (severe): Secondary | ICD-10-CM | POA: Diagnosis not present

## 2023-04-15 DIAGNOSIS — K219 Gastro-esophageal reflux disease without esophagitis: Secondary | ICD-10-CM

## 2023-04-15 DIAGNOSIS — M5440 Lumbago with sciatica, unspecified side: Secondary | ICD-10-CM

## 2023-04-15 LAB — BAYER DCA HB A1C WAIVED: HB A1C (BAYER DCA - WAIVED): 4.4 % — ABNORMAL LOW (ref 4.8–5.6)

## 2023-04-15 MED ORDER — DULOXETINE HCL 60 MG PO CPEP
60.0000 mg | ORAL_CAPSULE | Freq: Every day | ORAL | 3 refills | Status: DC
Start: 2023-04-15 — End: 2024-04-12

## 2023-04-15 MED ORDER — FLUTICASONE PROPIONATE 50 MCG/ACT NA SUSP
2.0000 | Freq: Every day | NASAL | 1 refills | Status: DC
Start: 2023-04-15 — End: 2024-02-27

## 2023-04-15 MED ORDER — GABAPENTIN 300 MG PO CAPS: 300.0000 mg | ORAL_CAPSULE | Freq: Two times a day (BID) | ORAL | 1 refills | Status: DC

## 2023-04-15 MED ORDER — DESLORATADINE 5 MG PO TABS: 5.0000 mg | ORAL_TABLET | ORAL | 3 refills | Status: DC

## 2023-04-15 MED ORDER — PANTOPRAZOLE SODIUM 40 MG PO TBEC
40.0000 mg | DELAYED_RELEASE_TABLET | Freq: Every day | ORAL | 3 refills | Status: DC
Start: 2023-04-15 — End: 2024-05-31

## 2023-04-15 MED ORDER — HYDROXYZINE HCL 25 MG PO TABS
ORAL_TABLET | ORAL | 3 refills | Status: DC
Start: 2023-04-15 — End: 2024-01-16

## 2023-04-15 MED ORDER — METHOCARBAMOL 500 MG PO TABS: 500.0000 mg | ORAL_TABLET | Freq: Four times a day (QID) | ORAL | 1 refills | Status: DC | PRN

## 2023-04-15 NOTE — Telephone Encounter (Signed)
done

## 2023-04-15 NOTE — Progress Notes (Signed)
Regina Horton is a 72 y.o. female who is accompanied today's visit by her son.  She presents to office today for annual physical exam examination.    Concerns today include: She has been lost to follow-up to me for about a year and a half.  Hypothyroidism:  She reports compliance with Synthroid.  She has had some anxiety and depression symptoms but reports no other concerning hyper- or hypothyroid symptoms. She found Atarax to be helpful in the past for sleep.  She is compliant with her Cymbalta 60 mg daily.  Chronic back pain Patient is under the care of of pain management but has been lost to follow-up with them because she did not want to be on opioids anymore.  She worries about side effects of the medications and dependency.  She subsequently has been relying on gabapentin and her muscle relaxant that was being prescribed by her previous doctor.  She uses methocarbamol 1000 mg up to 4 times daily.  She takes gabapentin 300 mg twice daily.  These have been working okay.  She has done dry needling and injection therapy in the past and it stopped working after a while but she would be willing to revisit this.  She reports limited mobility and primarily depends on walker for ambulation in the house and a wheelchair outside of the home.  Allergic rhinitis Patient reports that she is been having some left-sided ear pain.  She needs refills on Flonase.  Not taking any oral antihistamines.  She thought it might be dental pain but she notes it does radiate some down her throat.  Osteoporosis She is currently treated with Prolia but is tearful today talking about how the medication is going to cost her in the 300s and she cannot afford this.  Unfortunately, she suffers from CKD 4 and cannot take oral bisphosphonates etc.  She has been lost to follow-up with Dr. Wolfgang Phoenix for the last year.  She has an appointment with one of the other nephrologist in September.  She does not report any substantial  swelling   Occupation: retired, Substance use: none Health Maintenance Due  Topic Date Due   Zoster Vaccines- Shingrix (1 of 2) 03/15/1999   Colonoscopy  09/19/2013   MAMMOGRAM  12/28/2021   COVID-19 Vaccine (6 - 2023-24 season) 04/19/2022   INFLUENZA VACCINE  03/20/2023   Refills needed today: all  Immunization History  Administered Date(s) Administered   Influenza Whole 05/02/2010   Influenza, High Dose Seasonal PF 07/17/2017, 08/15/2017, 06/22/2018   Influenza,inj,Quad PF,6+ Mos 07/07/2014, 05/16/2015, 06/12/2016   Influenza-Unspecified 07/18/2017, 07/01/2018, 06/23/2020, 06/20/2021, 08/01/2022   Moderna Sars-Covid-2 Vaccination 10/01/2019, 10/29/2019   PFIZER(Purple Top)SARS-COV-2 Vaccination 06/22/2020, 11/20/2020, 06/20/2021   Pneumococcal Conjugate-13 01/09/2015   Pneumococcal Polysaccharide-23 08/19/1998, 02/07/2011, 06/12/2016   Respiratory Syncytial Virus Vaccine,Recomb Aduvanted(Arexvy) 08/01/2022   Td 08/07/2010   Tdap 08/01/2022   Zoster, Live 02/15/2011   Past Medical History:  Diagnosis Date   Allergic rhinitis    Allergy Don't remember   Nickel ears pierced when I was 14    Sinus meds 2017   Angina at rest    Anxiety    Aortic sclerosis    Arthritis Many years ago   Cataract 2016   CHF (congestive heart failure) (HCC) 2020   Dr Tresa Endo found it.   Chronic back pain    Chronic constipation    CKD (chronic kidney disease)    Depression    Fibromyalgia    GERD (gastroesophageal reflux disease)  Hyperlipidemia    Hypertension    Hypertension    Mitral regurgitation    Osteoporosis    Palpitations    Thyroid disease    Tricuspid regurgitation    Social History   Socioeconomic History   Marital status: Married    Spouse name: Illene Labrador   Number of children: 2   Years of education: 12   Highest education level: High school graduate  Occupational History   Occupation: disabled   Tobacco Use   Smoking status: Former    Current packs/day: 0.00     Average packs/day: 1 pack/day for 20.0 years (20.0 ttl pk-yrs)    Types: Cigarettes    Start date: 12/24/1983    Quit date: 12/24/2003    Years since quitting: 19.3   Smokeless tobacco: Never   Tobacco comments:    Stopped on May 07,2005 before back surgery. Haven't smoked sence.  Vaping Use   Vaping status: Never Used  Substance and Sexual Activity   Alcohol use: No   Drug use: Not Currently    Frequency: 1.0 times per week    Types: Marijuana    Comment: boils marijuana in water and makes a tea about 3 times a week   Sexual activity: Not Currently    Birth control/protection: Surgical    Comment: Tubes tied 1973  Other Topics Concern   Not on file  Social History Narrative   Not on file   Social Determinants of Health   Financial Resource Strain: Low Risk  (10/02/2022)   Overall Financial Resource Strain (CARDIA)    Difficulty of Paying Living Expenses: Not hard at all  Food Insecurity: No Food Insecurity (10/02/2022)   Hunger Vital Sign    Worried About Running Out of Food in the Last Year: Never true    Ran Out of Food in the Last Year: Never true  Transportation Needs: No Transportation Needs (10/02/2022)   PRAPARE - Administrator, Civil Service (Medical): No    Lack of Transportation (Non-Medical): No  Physical Activity: Insufficiently Active (10/02/2022)   Exercise Vital Sign    Days of Exercise per Week: 3 days    Minutes of Exercise per Session: 30 min  Stress: No Stress Concern Present (10/02/2022)   [employer] of Occupational Health - Occupational Stress Questionnaire    Feeling of Stress : Not at all  Social Connections: Moderately Isolated (10/02/2022)   Social Connection and Isolation Panel [NHANES]    Frequency of Communication with Friends and Family: More than three times a week    Frequency of Social Gatherings with Friends and Family: More than three times a week    Attends Religious Services: Never    Database administrator or  Organizations: No    Attends Banker Meetings: Never    Marital Status: Married  Catering manager Violence: Not At Risk (10/02/2022)   Humiliation, Afraid, Rape, and Kick questionnaire    Fear of Current or Ex-Partner: No    Emotionally Abused: No    Physically Abused: No    Sexually Abused: No   Past Surgical History:  Procedure Laterality Date   BACK SURGERY  08/20/2003   BREAST LUMPECTOMY  08/19/1968   right    CATARACT EXTRACTION W/PHACO Right 06/15/2015   Procedure: CATARACT EXTRACTION PHACO AND INTRAOCULAR LENS PLACEMENT (IOC);  Surgeon: Gemma Payor, MD;  Location: [facility];  Service: Ophthalmology;  Laterality: Right;  CDE: 10.33   CATARACT EXTRACTION W/PHACO Left 07/17/2015  Procedure: CATARACT EXTRACTION PHACO AND INTRAOCULAR LENS PLACEMENT (IOC);  Surgeon: Gemma Payor, MD;  Location: [facility];  Service: Ophthalmology;  Laterality: Left;  CDE:7.60   EYE SURGERY  2018   Cataracts   FRACTURE SURGERY  2016   Left ankle.   NM MYOCAR PERF WALL MOTION  02/16/2009   normal   ORIF ANKLE FRACTURE Left 10/26/2014   Procedure: OPEN REDUCTION INTERNAL FIXATION (ORIF) ANKLE FRACTURE;  Surgeon: Darreld Mclean, MD;  Location: [facility];  Service: Orthopedics;  Laterality: Left;   SPINE SURGERY  2005   Fusion with hardware   TONSILLECTOMY  childhood    TUBAL LIGATION  08/20/1971   US ECHOCARDIOGRAPHY  12/19/2008   mild mitral annular ca+,AOV mildly sclerotic,mild MR & TR   Family History  Problem Relation Age of Onset   Rheum arthritis Mother    Osteoporosis Mother    Arthritis Mother    Depression Mother    Varicose Veins Mother    Heart disease Father    Diabetes Father    Hyperlipidemia Father    Bladder Cancer Father    Cancer Father    Depression Father    Hypertension Father    Obesity Father    Heart disease Brother    Arthritis Brother    Alcohol abuse Brother    Drug abuse Brother    Depression Maternal Grandmother     Current Outpatient Medications:     atorvastatin (LIPITOR) 40 MG tablet, Take 1 tablet (40 mg total) by mouth daily., Disp: 90 tablet, Rfl: 3   Carboxymethylcellul-Glycerin (REFRESH RELIEVA OP), Apply 1 drop to eye. 6 times per day, Disp: , Rfl:    denosumab (PROLIA) 60 MG/ML SOSY injection, Inject 60 mg into the skin every 6 (six) months., Disp: 180 mL, Rfl: 0   diltiazem (CARDIZEM CD) 120 MG 24 hr capsule, Take 1 capsule (120 mg total) by mouth daily., Disp: 90 capsule, Rfl: 3   DULoxetine (CYMBALTA) 60 MG capsule, Take 1 capsule (60 mg total) by mouth daily., Disp: 90 capsule, Rfl: 3   fenofibrate 160 MG tablet, Take 1 tablet (160 mg total) by mouth daily., Disp: 90 tablet, Rfl: 3   fluticasone (FLONASE) 50 MCG/ACT nasal spray, USE 2 SPRAYS IN EACH NOSTRIL EVERY DAY, Disp: 48 g, Rfl: 1   furosemide (LASIX) 20 MG tablet, TAKE 2 TABLETS BY MOUTH IN THE MORNING AND 1 IN THE EVENING, Disp: 90 tablet, Rfl: 3   hydrOXYzine (ATARAX/VISTARIL) 25 MG tablet, Take 25-50 mg by mouth at bedtime., Disp: , Rfl:    levothyroxine (SYNTHROID) 88 MCG tablet, Take 1 tablet (88 mcg total) by mouth daily., Disp: 90 tablet, Rfl: 3   losartan (COZAAR) 25 MG tablet, Take 0.5 tablets (12.5 mg total) by mouth daily., Disp: 45 tablet, Rfl: 3   metoprolol succinate (TOPROL-XL) 100 MG 24 hr tablet, Take 1 tablet every morning and take 1/2 tablet every evening-Take with or immediately following a meal., Disp: 135 tablet, Rfl: 3   omega-3 acid ethyl esters (LOVAZA) 1 g capsule, Take 2 capsules (2 g total) by mouth 2 (two) times daily., Disp: 360 capsule, Rfl: 3   pantoprazole (PROTONIX) 40 MG tablet, Take 1 tablet (40 mg total) by mouth daily., Disp: 90 tablet, Rfl: 3  Current Facility-Administered Medications:    denosumab (PROLIA) injection 60 mg, 60 mg, Subcutaneous, Q6 months, Tuesday Terlecki M, DO, 60 mg at 12/30/22 1448  Allergies  Allergen Reactions   Levocetirizine Hives and Itching   Xyzal [Levocetirizine  Dihydrochloride] Hives and Itching    Codeine     Pt states she does not have any problems with codeine.   Nickel Rash     ROS: Review of Systems Pertinent items noted in HPI and remainder of comprehensive ROS otherwise negative.    Physical exam BP 134/86   Pulse 60   Temp 98.7 F (37.1 C)   Ht 5\' 4"  (1.626 m)   Wt 211 lb (95.7 kg)   SpO2 95%   BMI 36.22 kg/m  General appearance: alert, cooperative, appears stated age, and no distress Head: Normocephalic, without obvious abnormality, atraumatic Eyes: negative findings: lids and lashes normal, conjunctivae and sclerae normal, corneas clear, and pupils equal, round, reactive to light and accomodation Ears: normal TM's and external ear canals both ears Nose: Nares normal. Septum midline. Mucosa normal. No drainage or sinus tenderness. Throat: lips, mucosa, and tongue normal; teeth and gums normal Neck: no adenopathy, supple, symmetrical, trachea midline, and thyroid not enlarged, symmetric, no tenderness/mass/nodules Back:  Increased kyphosis of the thoracic spine.  She arrives in wheelchair today.  Tone is fair Lungs: clear to auscultation bilaterally Heart: regular rate and rhythm, S1, S2 normal, no murmur, click, rub or gallop Abdomen: soft, non-tender; bowel sounds normal; no masses,  no organomegaly Extremities: extremities normal, atraumatic, no cyanosis or edema Pulses: 2+ and symmetric Skin: Skin color, texture, turgor normal. No rashes or lesions Lymph nodes: Cervical, supraclavicular, and axillary nodes normal. Neurologic: Grossly normal      04/15/2023    2:19 PM 10/02/2022    3:38 PM 08/27/2021    3:07 PM  Depression screen PHQ 2/9  Decreased Interest 1 0 0  Down, Depressed, Hopeless 1 0 0  PHQ - 2 Score 2 0 0  Altered sleeping 1    Tired, decreased energy 0    Change in appetite 0    Feeling bad or failure about yourself  1    Trouble concentrating 0    Moving slowly or fidgety/restless 0    Suicidal thoughts 0    PHQ-9 Score 4    Difficult  doing work/chores Somewhat difficult        04/15/2023    2:20 PM 08/14/2021    4:10 PM 02/08/2021    4:52 PM 02/08/2021    4:35 PM  GAD 7 : Generalized Anxiety Score  Nervous, Anxious, on Edge 1 0 0 1  Control/stop worrying 1 0  0  Worry too much - different things 1 0  0  Trouble relaxing 2 0  0  Restless 1 0  0  Easily annoyed or irritable 1 0  1  Afraid - awful might happen 1 0  0  Total GAD 7 Score 8 0  2  Anxiety Difficulty Somewhat difficult Not difficult at all  Not difficult at all     Assessment/ Plan: Regina Horton here for annual physical exam.   Annual physical exam  Stage 4 chronic kidney disease (HCC) - Plan: CMP14+EGFR  Morbid obesity (HCC) - Plan: Lipid Panel, Bayer DCA Hb A1c Waived  Acquired hypothyroidism - Plan: TSH, T4, Free  Chronic bilateral low back pain with sciatica, sciatica laterality unspecified - Plan: gabapentin (NEURONTIN) 300 MG capsule, DULoxetine (CYMBALTA) 60 MG capsule, methocarbamol (ROBAXIN) 500 MG tablet, Ambulatory referral to Physical Medicine Rehab, CANCELED: Amb referral to Pediatric Physical Medicine Rehab  Gastroesophageal reflux disease without esophagitis - Plan: pantoprazole (PROTONIX) 40 MG tablet  Eustachian tube dysfunction, left - Plan: fluticasone (FLONASE) 50  MCG/ACT nasal spray, hydrOXYzine (ATARAX) 25 MG tablet, desloratadine (CLARINEX) 5 MG tablet  Stage IV renal disease.  Dr. Wolfgang Phoenix actually called me about this patient.  He will gladly continue to see her if she would like to establish at his new practice.  I will reach out to patient as FYI with this information.  Otherwise please keep appointment with new nephrologist to establish care in September.  In the interim, check CMP  Check A1c, nonfasting lipid, thyroid levels  I have renewed her gabapentin at current dosing, duloxetine and methocarbamol.  Caution sedation with that high dose of methocarbamol in an elderly female. Referral to physical medicine and  rehabilitation placed as she wants to explore nonopioid treatments for her chronic pain  GERD is chronic and stable.  PPI renewed  Suspect some eustachian tube dysfunction on the left.  Atarax given for as needed use for sleep and panic attack.  I have also added Clarinex and Flonase renewed  Counseled on healthy lifestyle choices, including diet (rich in fruits, vegetables and lean meats and low in salt and simple carbohydrates) and exercise (at least 30 minutes of moderate physical activity daily).  Patient to follow up 3 months for check up  Briel Gallicchio M. Nadine Counts, DO

## 2023-04-16 ENCOUNTER — Other Ambulatory Visit: Payer: Self-pay | Admitting: Family Medicine

## 2023-04-16 DIAGNOSIS — E039 Hypothyroidism, unspecified: Secondary | ICD-10-CM

## 2023-04-16 LAB — TSH: TSH: 0.527 u[IU]/mL (ref 0.450–4.500)

## 2023-04-16 LAB — CMP14+EGFR
ALT: 11 IU/L (ref 0–32)
AST: 23 IU/L (ref 0–40)
Albumin: 3.7 g/dL — ABNORMAL LOW (ref 3.8–4.8)
Alkaline Phosphatase: 49 IU/L (ref 44–121)
BUN/Creatinine Ratio: 19 (ref 12–28)
BUN: 43 mg/dL — ABNORMAL HIGH (ref 8–27)
Bilirubin Total: 0.5 mg/dL (ref 0.0–1.2)
CO2: 24 mmol/L (ref 20–29)
Calcium: 9.4 mg/dL (ref 8.7–10.3)
Chloride: 102 mmol/L (ref 96–106)
Creatinine, Ser: 2.22 mg/dL — ABNORMAL HIGH (ref 0.57–1.00)
Globulin, Total: 1.9 g/dL (ref 1.5–4.5)
Glucose: 111 mg/dL — ABNORMAL HIGH (ref 70–99)
Potassium: 3.9 mmol/L (ref 3.5–5.2)
Sodium: 143 mmol/L (ref 134–144)
Total Protein: 5.6 g/dL — ABNORMAL LOW (ref 6.0–8.5)
eGFR: 23 mL/min/{1.73_m2} — ABNORMAL LOW (ref >59)

## 2023-04-16 LAB — LIPID PANEL
Chol/HDL Ratio: 2.9 ratio (ref 0.0–4.4)
Cholesterol, Total: 119 mg/dL (ref 100–199)
HDL: 41 mg/dL (ref >39)
LDL Chol Calc (NIH): 51 mg/dL (ref 0–99)
Triglycerides: 158 mg/dL — ABNORMAL HIGH (ref 0–149)
VLDL Cholesterol Cal: 27 mg/dL (ref 5–40)

## 2023-04-16 LAB — T4, FREE: Free T4: 2.03 ng/dL — ABNORMAL HIGH (ref 0.82–1.77)

## 2023-04-16 MED ORDER — LEVOTHYROXINE SODIUM 75 MCG PO TABS
75.0000 ug | ORAL_TABLET | Freq: Every day | ORAL | 0 refills | Status: DC
Start: 2023-04-16 — End: 2023-07-28

## 2023-06-18 ENCOUNTER — Telehealth: Payer: Self-pay | Admitting: Family Medicine

## 2023-06-18 DIAGNOSIS — M810 Age-related osteoporosis without current pathological fracture: Secondary | ICD-10-CM

## 2023-06-18 MED ORDER — DENOSUMAB 60 MG/ML ~~LOC~~ SOSY
60.0000 mg | PREFILLED_SYRINGE | Freq: Once | SUBCUTANEOUS | Status: AC
Start: 2023-07-02 — End: ?

## 2023-06-18 MED ORDER — DENOSUMAB 60 MG/ML ~~LOC~~ SOSY
60.0000 mg | PREFILLED_SYRINGE | Freq: Once | SUBCUTANEOUS | Status: DC
Start: 2023-06-18 — End: 2023-06-18

## 2023-06-18 NOTE — Telephone Encounter (Signed)
Prolia ordered. Patient is due in November. Dx: Osteoporosis without current pathological fracture, unspecified osteoporosis type [M81.0]  Provider: Dr. Nadine Counts

## 2023-06-23 ENCOUNTER — Telehealth: Payer: Self-pay

## 2023-06-23 MED ORDER — FUROSEMIDE 20 MG PO TABS: ORAL_TABLET | ORAL | 3 refills | Status: DC

## 2023-06-24 ENCOUNTER — Other Ambulatory Visit (HOSPITAL_COMMUNITY): Payer: Self-pay

## 2023-07-02 ENCOUNTER — Other Ambulatory Visit (HOSPITAL_COMMUNITY): Payer: Self-pay

## 2023-07-02 ENCOUNTER — Telehealth: Payer: Self-pay

## 2023-07-02 NOTE — Telephone Encounter (Signed)
Prolia VOB initiated via [email or web address] 

## 2023-07-03 NOTE — Telephone Encounter (Signed)
Pharmacy Patient Advocate Encounter  Received notification from  [employer]  that Prior Authorization for Prolia has been APPROVED from 07/03/23 to 07/02/24   PA #/Case ID/Reference #: [id number]

## 2023-07-07 ENCOUNTER — Other Ambulatory Visit (HOSPITAL_COMMUNITY): Payer: Self-pay

## 2023-07-07 NOTE — Telephone Encounter (Signed)
Pt ready for scheduling for PROLIA on or after : 07/07/23  Out-of-pocket cost due at time of visit: $350  Primary: [employer] MEDICARE Prolia co-insurance: 20% Admin fee co-insurance: $30  Secondary: --- Prolia co-insurance:  Admin fee co-insurance:   Medical Benefit Details: Date Benefits were checked: 07/04/23 Deductible: NO/ Coinsurance: 20%/ Admin Fee: $30  Prior Auth: APPROVED PA# [id number]  Expiration Date: 07/03/23-07/02/24  # of doses approved: 2  Pharmacy benefit: Copay $375 If patient wants fill through the pharmacy benefit please send prescription to: [facility], and include estimated need by date in rx notes. Pharmacy will ship medication directly to the office.  Patient NOT eligible for Prolia Copay Card. Copay Card can make patient's cost as little as $25. Link to apply: [email or web address]  ** This summary of benefits is an estimation of the patient's out-of-pocket cost. Exact cost may very based on individual plan coverage.

## 2023-07-11 NOTE — Telephone Encounter (Signed)
Called patient and left message that we have received Prolia buy and bill and to please call and schedule appointment with clinical support.

## 2023-07-26 ENCOUNTER — Other Ambulatory Visit: Payer: Self-pay | Admitting: Family Medicine

## 2023-07-26 DIAGNOSIS — E039 Hypothyroidism, unspecified: Secondary | ICD-10-CM

## 2023-08-23 ENCOUNTER — Encounter: Payer: Self-pay | Admitting: Family Medicine

## 2023-08-26 ENCOUNTER — Ambulatory Visit: Payer: Medicare Other | Admitting: Family Medicine

## 2023-08-26 ENCOUNTER — Other Ambulatory Visit: Payer: Self-pay | Admitting: Family Medicine

## 2023-08-26 DIAGNOSIS — M810 Age-related osteoporosis without current pathological fracture: Secondary | ICD-10-CM

## 2023-08-26 DIAGNOSIS — E039 Hypothyroidism, unspecified: Secondary | ICD-10-CM

## 2023-09-10 ENCOUNTER — Other Ambulatory Visit: Payer: Self-pay | Admitting: General Practice

## 2023-09-10 ENCOUNTER — Other Ambulatory Visit: Payer: Self-pay | Admitting: Family Medicine

## 2023-09-10 DIAGNOSIS — E039 Hypothyroidism, unspecified: Secondary | ICD-10-CM

## 2023-10-03 ENCOUNTER — Other Ambulatory Visit: Payer: Self-pay | Admitting: Cardiovascular Disease

## 2023-10-05 ENCOUNTER — Other Ambulatory Visit: Payer: Self-pay | Admitting: General Practice

## 2023-10-16 NOTE — Telephone Encounter (Signed)
 Refill request

## 2023-10-21 ENCOUNTER — Ambulatory Visit: Payer: Medicare Other

## 2023-10-21 VITALS — Ht 64.0 in | Wt 211.0 lb

## 2023-10-21 DIAGNOSIS — Z0000 Encounter for general adult medical examination without abnormal findings: Secondary | ICD-10-CM | POA: Diagnosis not present

## 2023-10-21 NOTE — Patient Instructions (Signed)
 Regina Horton , Thank you for taking time to come for your Medicare Wellness Visit. I appreciate your ongoing commitment to your health goals. Please review the following plan we discussed and let me know if I can assist you in the future.   Referrals/Orders/Follow-Ups/Clinician Recommendations: Aim for 30 minutes of exercise or brisk walking, 6-8 glasses of water, and 5 servings of fruits and vegetables each day.  This is a list of the screening recommended for you and due dates:  Health Maintenance  Topic Date Due   Zoster (Shingles) Vaccine (1 of 2) 03/15/1999   Colon Cancer Screening  09/19/2013   Mammogram  12/28/2021   Flu Shot  03/20/2023   COVID-19 Vaccine (6 - 2024-25 season) 04/20/2023   Medicare Annual Wellness Visit  10/20/2024   DTaP/Tdap/Td vaccine (3 - Td or Tdap) 08/01/2032   Pneumonia Vaccine  Completed   DEXA scan (bone density measurement)  Completed   Hepatitis C Screening  Completed   HPV Vaccine  Aged Out    Advanced directives: (ACP Link)Information on Advanced Care Planning can be found at [facility] Advance Health Care Directives Advance Health Care Directives ([email or web address]   Next Medicare Annual Wellness Visit scheduled for next year: Yes

## 2023-10-21 NOTE — Progress Notes (Signed)
 Subjective:   Regina Horton is a 73 y.o. who presents for a Medicare Wellness preventive visit.  Visit Complete: Virtual I connected with  Regina Horton on 10/21/23 by a audio enabled telemedicine application and verified that I am speaking with the correct person using two identifiers.  Patient Location: Home  Provider Location: Home Office  I discussed the limitations of evaluation and management by telemedicine. The patient expressed understanding and agreed to proceed.  Vital Signs: Because this visit was a virtual/telehealth visit, some criteria may be missing or patient reported. Any vitals not documented were not able to be obtained and vitals that have been documented are patient reported.  VideoDeclined- This patient declined Librarian, academic. Therefore the visit was completed with audio only.  AWV Questionnaire: Yes: Patient Medicare AWV questionnaire was completed by the patient on 10/18/23; I have confirmed that all information answered by patient is correct and no changes since this date.  Cardiac Risk Factors include: advanced age (>96men, >38 women);hypertension     Objective:    Today's Vitals   10/21/23 1723  Weight: 211 lb (95.7 kg)  Height: 5\' 4"  (1.626 m)   Body mass index is 36.22 kg/m.     10/21/2023    5:27 PM 10/02/2022    3:39 PM 08/27/2021    3:09 PM 08/24/2020    2:31 PM 08/07/2017    7:19 AM 01/10/2017    4:39 PM 12/23/2016    3:33 PM  Advanced Directives  Does Patient Have a Medical Advance Directive? No No No No No No No  Would patient like information on creating a medical advance directive? Yes (MAU/Ambulatory/Procedural Areas - Information given) No - Patient declined No - Patient declined No - Patient declined Yes (MAU/Ambulatory/Procedural Areas - Information given)  Yes (MAU/Ambulatory/Procedural Areas - Information given)    Current Medications (verified) Outpatient Encounter Medications as of 10/21/2023   Medication Sig   atorvastatin (LIPITOR) 40 MG tablet TAKE 1 TABLET BY MOUTH ONCE DAILY . APPOINTMENT REQUIRED FOR FUTURE REFILLS   Carboxymethylcellul-Glycerin (REFRESH RELIEVA OP) Apply 1 drop to eye. 6 times per day   desloratadine (CLARINEX) 5 MG tablet Take 1 tablet (5 mg total) by mouth every other day. For allergies   diltiazem (CARDIZEM CD) 120 MG 24 hr capsule Take 1 capsule (120 mg total) by mouth daily.   DULoxetine (CYMBALTA) 60 MG capsule Take 1 capsule (60 mg total) by mouth daily.   fenofibrate 160 MG tablet Take 1 tablet (160 mg total) by mouth daily.   fluticasone (FLONASE) 50 MCG/ACT nasal spray Place 2 sprays into both nostrils daily.   furosemide (LASIX) 20 MG tablet TAKE 2 TABLETS BY MOUTH IN THE MORNING AND 1 IN THE EVENING   gabapentin (NEURONTIN) 300 MG capsule Take 1 capsule (300 mg total) by mouth 2 (two) times daily.   hydrOXYzine (ATARAX) 25 MG tablet Take 1 tablet at bedtime if needed for sleep. Ok to take 1/2 tablet during the day if needed for panic attack.   levothyroxine (SYNTHROID) 75 MCG tablet Take 1 tablet (75 mcg total) by mouth daily. ** NEEDS LABWORK**   losartan (COZAAR) 25 MG tablet Take 0.5 tablets (12.5 mg total) by mouth daily.   methocarbamol (ROBAXIN) 500 MG tablet Take 1-2 tablets (500-1,000 mg total) by mouth every 6 (six) hours as needed for muscle spasms.   metoprolol succinate (TOPROL-XL) 100 MG 24 hr tablet Take 1 tablet every morning and take 1/2 tablet every evening-Take  with or immediately following a meal.   pantoprazole (PROTONIX) 40 MG tablet Take 1 tablet (40 mg total) by mouth daily.   omega-3 acid ethyl esters (LOVAZA) 1 g capsule Take 2 capsules (2 g total) by mouth 2 (two) times daily.   Facility-Administered Encounter Medications as of 10/21/2023  Medication   denosumab (PROLIA) injection 60 mg    Allergies (verified) Levocetirizine, Xyzal [levocetirizine dihydrochloride], Codeine, and Nickel   History: Past Medical History:   Diagnosis Date   Allergic rhinitis    Allergy Don't remember   Nickel ears pierced when I was 14    Sinus meds 2017   Anemia 1974 /2005   After having baby, again befor back surgery   Angina at rest [facility])    Anxiety    Aortic sclerosis    Arthritis Many years ago   Cataract 2016   CHF (congestive heart failure) (HCC) 2020   Dr Tresa Endo found it.   Chronic back pain    Chronic constipation    CKD (chronic kidney disease)    COPD (chronic obstructive pulmonary disease) (HCC) 1958   Chronic bronchitis   Depression    Fibromyalgia    GERD (gastroesophageal reflux disease)    Hyperlipidemia    Hypertension    Hypertension    Mitral regurgitation    Osteoporosis    Palpitations    Thyroid disease    Tricuspid regurgitation    Ulcer    Past Surgical History:  Procedure Laterality Date   BACK SURGERY  08/20/2003   BREAST LUMPECTOMY  08/19/1968   right    CATARACT EXTRACTION W/PHACO Right 06/15/2015   Procedure: CATARACT EXTRACTION PHACO AND INTRAOCULAR LENS PLACEMENT (IOC);  Surgeon: Gemma Payor, MD;  Location: [facility];  Service: Ophthalmology;  Laterality: Right;  CDE: 10.33   CATARACT EXTRACTION W/PHACO Left 07/17/2015   Procedure: CATARACT EXTRACTION PHACO AND INTRAOCULAR LENS PLACEMENT (IOC);  Surgeon: Gemma Payor, MD;  Location: [facility];  Service: Ophthalmology;  Laterality: Left;  CDE:7.60   EYE SURGERY  2018   Cataracts   FRACTURE SURGERY  2016   Left ankle.   NM MYOCAR PERF WALL MOTION  02/16/2009   normal   ORIF ANKLE FRACTURE Left 10/26/2014   Procedure: OPEN REDUCTION INTERNAL FIXATION (ORIF) ANKLE FRACTURE;  Surgeon: Darreld Mclean, MD;  Location: [facility];  Service: Orthopedics;  Laterality: Left;   SPINE SURGERY  2005   Fusion with hardware   TONSILLECTOMY  childhood    TUBAL LIGATION  08/20/1971   US ECHOCARDIOGRAPHY  12/19/2008   mild mitral annular ca+,AOV mildly sclerotic,mild MR & TR   Family History  Problem Relation Age of Onset   Rheum arthritis Mother     Osteoporosis Mother    Arthritis Mother    Depression Mother    Varicose Veins Mother    Heart disease Father    Diabetes Father    Hyperlipidemia Father    Bladder Cancer Father    Cancer Father    Depression Father    Hypertension Father    Obesity Father    Heart disease Brother    Arthritis Brother    Alcohol abuse Brother    Drug abuse Brother    Depression Maternal Grandmother    Arthritis Maternal Grandmother    Cancer Maternal Grandmother    Diabetes Maternal Grandmother    Depression Maternal Grandfather    Early death Maternal Grandfather    Depression Paternal Grandfather    Alcohol abuse Brother  Arthritis Brother    Drug abuse Brother    Arthritis Maternal Uncle    Social History   Socioeconomic History   Marital status: Married    Spouse name: Illene Labrador   Number of children: 2   Years of education: 12   Highest education level: 12th grade  Occupational History   Occupation: disabled   Tobacco Use   Smoking status: Former    Current packs/day: 0.00    Average packs/day: 1 pack/day for 21.7 years (22.6 ttl pk-yrs)    Types: Cigarettes    Start date: 12/24/1983    Quit date: 12/24/2003    Years since quitting: 19.8   Smokeless tobacco: Never   Tobacco comments:    Stopped on May 07,2005 before back surgery. Haven't smoked sence.  Vaping Use   Vaping status: Never Used  Substance and Sexual Activity   Alcohol use: Not Currently   Drug use: Not Currently    Types: Marijuana    Comment: boils marijuana in water and makes a tea about 3 times a week   Sexual activity: Not Currently    Birth control/protection: Post-menopausal, Surgical, Other-see comments    Comment: Tubes tied 1973  Other Topics Concern   Not on file  Social History Narrative   Not on file   Social Drivers of Health   Financial Resource Strain: Medium Risk (10/21/2023)   Overall Financial Resource Strain (CARDIA)    Difficulty of Paying Living Expenses: Somewhat hard  Food  Insecurity: Food Insecurity Present (10/21/2023)   Hunger Vital Sign    Worried About Running Out of Food in the Last Year: Sometimes true    Ran Out of Food in the Last Year: Sometimes true  Transportation Needs: No Transportation Needs (10/21/2023)   PRAPARE - Administrator, Civil Service (Medical): No    Lack of Transportation (Non-Medical): No  Physical Activity: Inactive (10/21/2023)   Exercise Vital Sign    Days of Exercise per Week: 0 days    Minutes of Exercise per Session: 0 min  Stress: Stress Concern Present (10/21/2023)   [employer] of Occupational Health - Occupational Stress Questionnaire    Feeling of Stress : To some extent  Social Connections: Socially Isolated (10/21/2023)   Social Connection and Isolation Panel [NHANES]    Frequency of Communication with Friends and Family: Never    Frequency of Social Gatherings with Friends and Family: Never    Attends Religious Services: Never    Database administrator or Organizations: No    Attends Banker Meetings: Never    Marital Status: Married    Tobacco Counseling Counseling given: Not Answered Tobacco comments: Stopped on May 07,2005 before back surgery. Haven't smoked sence.    Clinical Intake:  Pre-visit preparation completed: Yes  Pain : No/denies pain     Diabetes: No  How often do you need to have someone help you when you read instructions, pamphlets, or other written materials from your doctor or pharmacy?: 1 - Never  Interpreter Needed?: No  Information entered by :: Kandis Fantasia LPN   Activities of Daily Living     10/18/2023    2:42 PM  In your present state of health, do you have any difficulty performing the following activities:  Hearing? 0  Vision? 0  Difficulty concentrating or making decisions? 0  Walking or climbing stairs? 1  Dressing or bathing? 1  Doing errands, shopping? 1  Preparing Food and eating ? Y  Using  the Toilet? Y  In the past six months,  have you accidently leaked urine? Y  Do you have problems with loss of bowel control? N  Managing your Medications? N  Managing your Finances? N  Housekeeping or managing your Housekeeping? N    Patient Care Team: Raliegh Ip, DO as PCP - General (Family Medicine) Lennette Bihari, MD as PCP - Cardiology (Cardiology) Lennette Bihari, MD as Consulting Physician (Cardiology) Eliezer Lofts, MD as Referring Physician (General Practice) Randa Lynn, MD as Consulting Physician (Nephrology)  Indicate any recent Medical Services you may have received from other than [facility] providers in the past year (date may be approximate).     Assessment:   This is a routine wellness examination for Lavida.  Hearing/Vision screen Hearing Screening - Comments:: Denies hearing difficulties   Vision Screening - Comments:: No vision problems; will schedule routine eye exam soon     Goals Addressed   None    Depression Screen     10/21/2023    5:25 PM 04/15/2023    2:19 PM 10/02/2022    3:38 PM 08/27/2021    3:07 PM 08/14/2021    4:10 PM 02/08/2021    4:34 PM 08/24/2020    2:31 PM  PHQ 2/9 Scores  PHQ - 2 Score 2 2 0 0 0 1 0  PHQ- 9 Score 4 4         Fall Risk     10/18/2023    2:42 PM 04/15/2023    2:01 PM 10/02/2022    3:36 PM 09/29/2022    7:03 PM 08/27/2021    2:51 PM  Fall Risk   Falls in the past year? 1 0 1 1 0  Number falls in past yr: 0 0 1 1 0  Injury with Fall? 0 0 1 1 0  Risk for fall due to : History of fall(s);Impaired mobility No Fall Risks History of fall(s);Impaired balance/gait;Orthopedic patient  Impaired balance/gait;Medication side effect;Orthopedic patient  Follow up Falls prevention discussed;Education provided;Falls evaluation completed Education provided Education provided;Falls prevention discussed  Education provided;Falls prevention discussed    MEDICARE RISK AT HOME:  Medicare Risk at Home Any stairs in or around the home?: (Patient-Rptd) Yes If so, are  there any without handrails?: (Patient-Rptd) Yes Home free of loose throw rugs in walkways, pet beds, electrical cords, etc?: (Patient-Rptd) No Adequate lighting in your home to reduce risk of falls?: (Patient-Rptd) Yes Life alert?: (Patient-Rptd) No Use of a cane, walker or w/c?: (Patient-Rptd) Yes Grab bars in the bathroom?: (Patient-Rptd) Yes Shower chair or bench in shower?: (Patient-Rptd) Yes Elevated toilet seat or a handicapped toilet?: (Patient-Rptd) Yes  TIMED UP AND GO:  Was the test performed?  No  Cognitive Function: 6CIT completed        10/21/2023    5:27 PM 10/02/2022    3:40 PM 08/27/2021    3:21 PM 08/24/2020    2:36 PM 12/23/2016    3:42 PM  6CIT Screen  What Year? 0 points 0 points 0 points 0 points 0 points  What month? 0 points 0 points 0 points 0 points 0 points  What time? 0 points 0 points 0 points 0 points 0 points  Count back from 20 0 points 0 points 0 points 0 points 0 points  Months in reverse 0 points 0 points 0 points 0 points 0 points  Repeat phrase 0 points 0 points 2 points 2 points 0 points  Total Score 0  points 0 points 2 points 2 points 0 points    Immunizations Immunization History  Administered Date(s) Administered   Influenza Whole 05/02/2010   Influenza, High Dose Seasonal PF 07/17/2017, 08/15/2017, 06/22/2018   Influenza,inj,Quad PF,6+ Mos 07/07/2014, 05/16/2015, 06/12/2016   Influenza-Unspecified 07/18/2017, 07/01/2018, 06/23/2020, 06/20/2021, 08/01/2022   Moderna Sars-Covid-2 Vaccination 10/01/2019, 10/29/2019   PFIZER(Purple Top)SARS-COV-2 Vaccination 06/22/2020, 11/20/2020, 06/20/2021   Pneumococcal Conjugate-13 01/09/2015   Pneumococcal Polysaccharide-23 08/19/1998, 02/07/2011, 06/12/2016   Respiratory Syncytial Virus Vaccine,Recomb Aduvanted(Arexvy) 08/01/2022   Td 08/07/2010   Tdap 08/01/2022   Zoster, Live 02/15/2011    Screening Tests Health Maintenance  Topic Date Due   Zoster Vaccines- Shingrix (1 of 2) 03/15/1999    Colonoscopy  09/19/2013   MAMMOGRAM  12/28/2021   INFLUENZA VACCINE  03/20/2023   COVID-19 Vaccine (6 - 2024-25 season) 04/20/2023   Medicare Annual Wellness (AWV)  10/20/2024   DTaP/Tdap/Td (3 - Td or Tdap) 08/01/2032   Pneumonia Vaccine 46+ Years old  Completed   DEXA SCAN  Completed   Hepatitis C Screening  Completed   HPV VACCINES  Aged Out    Health Maintenance  Health Maintenance Due  Topic Date Due   Zoster Vaccines- Shingrix (1 of 2) 03/15/1999   Colonoscopy  09/19/2013   MAMMOGRAM  12/28/2021   INFLUENZA VACCINE  03/20/2023   COVID-19 Vaccine (6 - 2024-25 season) 04/20/2023   Health Maintenance Items Addressed: Cologuard Ordered  Additional Screening:  Vision Screening: Recommended annual ophthalmology exams for early detection of glaucoma and other disorders of the eye.  Dental Screening: Recommended annual dental exams for proper oral hygiene  Community Resource Referral / Chronic Care Management: CRR required this visit?  No   CCM required this visit?  No     Plan:     I have personally reviewed and noted the following in the patient's chart:   Medical and social history Use of alcohol, tobacco or illicit drugs  Current medications and supplements including opioid prescriptions. Patient is not currently taking opioid prescriptions. Functional ability and status Nutritional status Physical activity Advanced directives List of other physicians Hospitalizations, surgeries, and ER visits in previous 12 months Vitals Screenings to include cognitive, depression, and falls Referrals and appointments  In addition, I have reviewed and discussed with patient certain preventive protocols, quality metrics, and best practice recommendations. A written personalized care plan for preventive services as well as general preventive health recommendations were provided to patient.     Kandis Fantasia [place], [place]   08/24/1094   After Visit Summary: (MyChart) Due  to this being a telephonic visit, the after visit summary with patients personalized plan was offered to patient via MyChart   Notes: Please refer to Routing Comments.

## 2023-10-24 ENCOUNTER — Telehealth: Payer: Self-pay

## 2023-10-24 NOTE — Telephone Encounter (Signed)
 Called patient to schedule appointment for Prolia injection - LMTCB

## 2023-11-11 ENCOUNTER — Other Ambulatory Visit: Payer: Self-pay | Admitting: General Practice

## 2023-11-11 ENCOUNTER — Other Ambulatory Visit: Payer: Self-pay | Admitting: Family Medicine

## 2023-11-11 DIAGNOSIS — G8929 Other chronic pain: Secondary | ICD-10-CM

## 2023-12-15 ENCOUNTER — Other Ambulatory Visit: Payer: Self-pay | Admitting: Cardiovascular Disease

## 2024-01-10 ENCOUNTER — Encounter: Payer: Self-pay | Admitting: Cardiovascular Disease

## 2024-01-10 ENCOUNTER — Encounter: Payer: Self-pay | Admitting: Family Medicine

## 2024-01-10 DIAGNOSIS — E039 Hypothyroidism, unspecified: Secondary | ICD-10-CM

## 2024-01-10 DIAGNOSIS — N184 Chronic kidney disease, stage 4 (severe): Secondary | ICD-10-CM

## 2024-01-11 ENCOUNTER — Other Ambulatory Visit: Payer: Self-pay | Admitting: Cardiovascular Disease

## 2024-01-11 ENCOUNTER — Other Ambulatory Visit: Payer: Self-pay | Admitting: General Practice

## 2024-01-11 ENCOUNTER — Other Ambulatory Visit: Payer: Self-pay | Admitting: Family Medicine

## 2024-01-11 DIAGNOSIS — E039 Hypothyroidism, unspecified: Secondary | ICD-10-CM

## 2024-01-11 DIAGNOSIS — G8929 Other chronic pain: Secondary | ICD-10-CM

## 2024-01-13 ENCOUNTER — Other Ambulatory Visit (HOSPITAL_COMMUNITY): Payer: Self-pay

## 2024-01-13 ENCOUNTER — Encounter

## 2024-01-13 MED ORDER — DILTIAZEM HCL ER COATED BEADS 120 MG PO CP24
120.0000 mg | ORAL_CAPSULE | Freq: Every day | ORAL | 3 refills | Status: DC
  Filled 2024-01-13: qty 90, 90d supply, fill #0

## 2024-01-13 MED ORDER — FUROSEMIDE 20 MG PO TABS
ORAL_TABLET | ORAL | 3 refills | Status: DC
  Filled 2024-01-13: qty 270, 90d supply, fill #0

## 2024-01-13 MED ORDER — METOPROLOL SUCCINATE ER 100 MG PO TB24
ORAL_TABLET | ORAL | 3 refills | Status: DC
  Filled 2024-01-13: qty 135, 90d supply, fill #0

## 2024-01-13 MED ORDER — LOSARTAN POTASSIUM 25 MG PO TABS
12.5000 mg | ORAL_TABLET | Freq: Every day | ORAL | 3 refills | Status: DC
  Filled 2024-01-13: qty 45, 90d supply, fill #0

## 2024-01-13 MED ORDER — ATORVASTATIN CALCIUM 40 MG PO TABS
40.0000 mg | ORAL_TABLET | Freq: Every day | ORAL | 3 refills | Status: DC
  Filled 2024-01-13: qty 90, 90d supply, fill #0

## 2024-01-13 MED ORDER — FENOFIBRATE 160 MG PO TABS
160.0000 mg | ORAL_TABLET | Freq: Every day | ORAL | 3 refills | Status: DC
  Filled 2024-01-13: qty 90, 90d supply, fill #0

## 2024-01-13 NOTE — Telephone Encounter (Signed)
 I'm DOD Friday. Please offer her video visit.  I will place order for labs and she can come prior but please offer her lab appt as well.

## 2024-01-13 NOTE — Telephone Encounter (Signed)
 Called to schedule appt and spoke with pt husband, he said he would tell Pt to call back to schedule appt since she was asleep

## 2024-01-13 NOTE — Telephone Encounter (Signed)
 Refills denied. 30 day supply sent in 11/11/23. NTBS with PCP for refills.

## 2024-01-14 ENCOUNTER — Other Ambulatory Visit

## 2024-01-16 ENCOUNTER — Encounter: Payer: Self-pay | Admitting: Family Medicine

## 2024-01-16 ENCOUNTER — Telehealth: Admitting: Family Medicine

## 2024-01-16 DIAGNOSIS — F419 Anxiety disorder, unspecified: Secondary | ICD-10-CM | POA: Diagnosis not present

## 2024-01-16 DIAGNOSIS — E039 Hypothyroidism, unspecified: Secondary | ICD-10-CM

## 2024-01-16 DIAGNOSIS — G8929 Other chronic pain: Secondary | ICD-10-CM | POA: Diagnosis not present

## 2024-01-16 DIAGNOSIS — M5441 Lumbago with sciatica, right side: Secondary | ICD-10-CM

## 2024-01-16 DIAGNOSIS — M5442 Lumbago with sciatica, left side: Secondary | ICD-10-CM

## 2024-01-16 DIAGNOSIS — Z6379 Other stressful life events affecting family and household: Secondary | ICD-10-CM

## 2024-01-16 MED ORDER — GABAPENTIN 300 MG PO CAPS: 300.0000 mg | ORAL_CAPSULE | Freq: Two times a day (BID) | ORAL | 3 refills | Status: DC

## 2024-01-16 MED ORDER — LEVOTHYROXINE SODIUM 75 MCG PO TABS: 75.0000 ug | ORAL_TABLET | Freq: Every day | ORAL | 0 refills | Status: DC

## 2024-01-16 MED ORDER — BUSPIRONE HCL 5 MG PO TABS: 5.0000 mg | ORAL_TABLET | Freq: Three times a day (TID) | ORAL | 0 refills | Status: DC

## 2024-01-16 NOTE — Progress Notes (Signed)
 MyChart Video visit  Subjective: UJ:[id number] related to loved on PCP: Eliodoro Guerin, DO WGN:FAOZHY Regina Horton is a 73 y.o. female. Patient provides verbal consent for consult held via video.  Due to COVID-19 pandemic this visit was conducted virtually. This visit type was conducted due to national recommendations for restrictions regarding the COVID-19 Pandemic (e.g. social distancing, sheltering in place) in an effort to limit this patient's exposure and mitigate transmission in our community. All issues noted in this document were discussed and addressed.  A physical exam was not performed with this format.   Location of patient: home Location of provider: [facility] Others present for call: spouse  1. Anxiety/ depression/chronic pain/hypothyroidism She reports she has been depressed for a while now.  She notes that her husband was recently diagnosed with cancer and they are trying to evaluate him for bone cancer.  They told him that if it is in the bone and there was nothing else they can do and she is quite grief stricken about this.  She is compliant with 60 mg of Cymbalta  daily.  This is renally dosed and is max dosed.  She cannot tolerate hydroxyzine  so she discontinued.  She notes that she is been out of her thyroid  medication for several weeks now.  She is also been out of gabapentin  for several weeks.   ROS: Per HPI  Allergies  Allergen Reactions   Levocetirizine Hives and Itching   Xyzal  [Levocetirizine Dihydrochloride ] Hives and Itching   Codeine     Pt states she does not have any problems with codeine.   Nickel Rash   Past Medical History:  Diagnosis Date   Allergic rhinitis    Allergy Don't remember   Nickel ears pierced when I was 14    Sinus meds 2017   Anemia 1974 /2005   After having baby, again befor back surgery   Angina at rest [facility])    Anxiety    Aortic sclerosis    Arthritis Many years ago   Cataract 2016   CHF (congestive heart failure) (HCC) 2020   Dr  Loetta Ringer found it.   Chronic back pain    Chronic constipation    CKD (chronic kidney disease)    COPD (chronic obstructive pulmonary disease) (HCC) 1958   Chronic bronchitis   Depression    Fibromyalgia    GERD (gastroesophageal reflux disease)    Hyperlipidemia    Hypertension    Hypertension    Mitral regurgitation    Osteoporosis    Palpitations    Thyroid  disease    Tricuspid regurgitation    Ulcer     Current Outpatient Medications:    atorvastatin  (LIPITOR) 40 MG tablet, Take 1 tablet (40 mg total) by mouth daily., Disp: 90 tablet, Rfl: 3   Carboxymethylcellul-Glycerin (REFRESH RELIEVA OP), Apply 1 drop to eye. 6 times per day, Disp: , Rfl:    desloratadine  (CLARINEX ) 5 MG tablet, Take 1 tablet (5 mg total) by mouth every other day. For allergies, Disp: 45 tablet, Rfl: 3   diltiazem  (CARDIZEM  CD) 120 MG 24 hr capsule, Take 1 capsule (120 mg total) by mouth daily., Disp: 90 capsule, Rfl: 3   DULoxetine  (CYMBALTA ) 60 MG capsule, Take 1 capsule (60 mg total) by mouth daily., Disp: 90 capsule, Rfl: 3   fenofibrate  160 MG tablet, Take 1 tablet (160 mg total) by mouth daily., Disp: 90 tablet, Rfl: 3   fluticasone  (FLONASE ) 50 MCG/ACT nasal spray, Place 2 sprays into both nostrils daily., Disp:  48 g, Rfl: 1   furosemide  (LASIX ) 20 MG tablet, Take 2 tablets (40 mg total) by mouth every morning AND 1 tablet (20 mg total) every evening., Disp: 270 tablet, Rfl: 3   gabapentin  (NEURONTIN ) 300 MG capsule, Take 1 capsule (300 mg total) by mouth 2 (two) times daily. **NEEDS TO BE SEEN BEFORE NEXT REFILL**, Disp: 60 capsule, Rfl: 0   hydrOXYzine  (ATARAX ) 25 MG tablet, Take 1 tablet at bedtime if needed for sleep. Ok to take 1/2 tablet during the day if needed for panic attack., Disp: 135 tablet, Rfl: 3   levothyroxine  (SYNTHROID ) 75 MCG tablet, Take 1 tablet (75 mcg total) by mouth daily. ** NEEDS LABWORK**, Disp: 30 tablet, Rfl: 0   losartan  (COZAAR ) 25 MG tablet, Take 1/2 tablet (12.5 mg total)  by mouth daily., Disp: 45 tablet, Rfl: 3   methocarbamol  (ROBAXIN ) 500 MG tablet, Take 1-2 tablets (500-1,000 mg total) by mouth every 6 (six) hours as needed for muscle spasms., Disp: 720 tablet, Rfl: 1   metoprolol  succinate (TOPROL -XL) 100 MG 24 hr tablet, Take 1 tablet (100 mg total) by mouth every morning AND 1/2 tablet (50 mg total) every evening. Take with or immediately following a meal., Disp: 135 tablet, Rfl: 3   omega-3 acid ethyl esters (LOVAZA ) 1 g capsule, Take 2 capsules (2 g total) by mouth 2 (two) times daily., Disp: 360 capsule, Rfl: 3   pantoprazole  (PROTONIX ) 40 MG tablet, Take 1 tablet (40 mg total) by mouth daily., Disp: 90 tablet, Rfl: 3  Current Facility-Administered Medications:    denosumab  (PROLIA ) injection 60 mg, 60 mg, Subcutaneous, Once, Berlinda Farve M, DO  Gen: tearful, sobbing  Assessment/ Plan: 73 y.o. female   Anxiety - Plan: busPIRone (BUSPAR) 5 MG tablet  Stress due to illness of family member - Plan: busPIRone (BUSPAR) 5 MG tablet  Chronic bilateral low back pain with sciatica, sciatica laterality unspecified - Plan: gabapentin  (NEURONTIN ) 300 MG capsule  Acquired hypothyroidism - Plan: levothyroxine  (SYNTHROID ) 75 MCG tablet  Anxiety and stress are uncontrolled despite use of Cymbalta  and she cannot tolerate the hydroxyzine  so that was discontinued.  I am adding BuSpar at low-dose given advanced renal disease.  She may take up to 3 times daily if needed.  We have scheduled a 3-week follow-up and she is aware of date and time.  I have renewed both gabapentin  and Synthroid  for now but she absolutely must be seen for thyroid  labs again because her last ones were abnormal.  Start time: 4:36p End time: 4:46p  Total time spent on patient care (including video visit/ documentation): 11 minutes  Duval Macleod Bambi Bonine, DO [facility] [phone]

## 2024-01-19 ENCOUNTER — Other Ambulatory Visit: Payer: Self-pay | Admitting: General Practice

## 2024-01-20 ENCOUNTER — Other Ambulatory Visit: Payer: Self-pay

## 2024-01-20 ENCOUNTER — Encounter (HOSPITAL_COMMUNITY): Payer: Self-pay

## 2024-01-20 ENCOUNTER — Other Ambulatory Visit (HOSPITAL_COMMUNITY): Payer: Self-pay

## 2024-01-20 MED ORDER — LOSARTAN POTASSIUM 25 MG PO TABS: 12.5000 mg | ORAL_TABLET | Freq: Every day | ORAL | 3 refills | Status: DC

## 2024-01-20 MED ORDER — ATORVASTATIN CALCIUM 40 MG PO TABS: 40.0000 mg | ORAL_TABLET | Freq: Every day | ORAL | 3 refills | Status: DC

## 2024-01-20 MED ORDER — METOPROLOL SUCCINATE ER 100 MG PO TB24: ORAL_TABLET | ORAL | 3 refills | Status: AC

## 2024-01-20 MED ORDER — FENOFIBRATE 160 MG PO TABS: 160.0000 mg | ORAL_TABLET | Freq: Every day | ORAL | 3 refills | Status: AC

## 2024-01-20 MED ORDER — DILTIAZEM HCL ER COATED BEADS 120 MG PO CP24: 120.0000 mg | ORAL_CAPSULE | Freq: Every day | ORAL | 3 refills | Status: AC

## 2024-01-20 MED ORDER — FUROSEMIDE 20 MG PO TABS: ORAL_TABLET | ORAL | 3 refills | Status: AC

## 2024-01-20 NOTE — Addendum Note (Signed)
 Addended by: Chriselda Leppert L on: 01/20/2024 01:49 PM   Modules accepted: Orders

## 2024-02-06 ENCOUNTER — Telehealth: Payer: Self-pay | Admitting: Family Medicine

## 2024-02-06 NOTE — Telephone Encounter (Signed)
 Copied from CRM [id number]. Topic: Appointments - Appointment Cancel/Reschedule >> Feb 06, 2024  3:37 PM Tiffany H wrote: Son called to advise that patient has been unwell and was unable to get her labs done for 02/10/24 appointment. Rescheduled to 02/25/24 to give patient time to get labs completed. No further action needed.

## 2024-02-10 ENCOUNTER — Telehealth: Admitting: Family Medicine

## 2024-02-25 ENCOUNTER — Telehealth: Admitting: Family Medicine

## 2024-02-25 ENCOUNTER — Encounter: Payer: Self-pay | Admitting: Family Medicine

## 2024-02-25 DIAGNOSIS — F419 Anxiety disorder, unspecified: Secondary | ICD-10-CM

## 2024-02-25 DIAGNOSIS — Z6379 Other stressful life events affecting family and household: Secondary | ICD-10-CM

## 2024-02-25 MED ORDER — BUSPIRONE HCL 7.5 MG PO TABS: 7.5000 mg | ORAL_TABLET | Freq: Three times a day (TID) | ORAL | Status: DC

## 2024-02-25 NOTE — Progress Notes (Signed)
 Patient was not feeling well did not want to do a virtual visit but she did tell me verbally that the buspirone  5 mg 3 times daily is not making huge difference right now.  She continues to have a lot of anxiety symptoms surrounding her husband's recent medical diagnosis.  She is not sleeping well and typically does not fall asleep very late.  Would like to advance her dose  Meds ordered this encounter  Medications   busPIRone  (BUSPAR ) 7.5 MG tablet    Sig: Take 1 tablet (7.5 mg total) by mouth 3 (three) times daily. For anxiety/ nerves    2-week virtual follow-up scheduled.  Patient aware of date and time and we will contact her around lunchtime per her request

## 2024-02-26 ENCOUNTER — Other Ambulatory Visit (HOSPITAL_COMMUNITY): Payer: Self-pay

## 2024-02-26 ENCOUNTER — Encounter (INDEPENDENT_AMBULATORY_CARE_PROVIDER_SITE_OTHER): Payer: Self-pay | Admitting: Family Medicine

## 2024-02-26 ENCOUNTER — Telehealth: Payer: Self-pay | Admitting: Family Medicine

## 2024-02-26 DIAGNOSIS — J069 Acute upper respiratory infection, unspecified: Secondary | ICD-10-CM | POA: Diagnosis not present

## 2024-02-26 DIAGNOSIS — B9689 Other specified bacterial agents as the cause of diseases classified elsewhere: Secondary | ICD-10-CM

## 2024-02-26 DIAGNOSIS — H6992 Unspecified Eustachian tube disorder, left ear: Secondary | ICD-10-CM

## 2024-02-26 NOTE — Telephone Encounter (Signed)
 I advised pt's son Dr KANDICE will be back in office tomorrow and will route to her

## 2024-02-26 NOTE — Telephone Encounter (Signed)
 Copied from CRM [id number]. Topic: Clinical - Medical Advice >> Feb 26, 2024  1:32 PM Sasha H wrote: Reason for CRM: Pt's son, Lytle called in stating pt spoke with Dr. KANDICE yesterday about an antibiotic and she is wanting that called in

## 2024-02-27 MED ORDER — BENZONATATE 100 MG PO CAPS: 100.0000 mg | ORAL_CAPSULE | Freq: Three times a day (TID) | ORAL | 0 refills | Status: DC | PRN

## 2024-02-27 MED ORDER — FLUTICASONE PROPIONATE 50 MCG/ACT NA SUSP: 2.0000 | Freq: Every day | NASAL | 1 refills | Status: DC

## 2024-02-27 NOTE — Telephone Encounter (Signed)
 Duplicated, noted. LS

## 2024-02-27 NOTE — Telephone Encounter (Signed)
 I have responded to patient in the duplicate encounter already and I am awaiting her response

## 2024-02-27 NOTE — Telephone Encounter (Signed)

## 2024-03-01 ENCOUNTER — Encounter (HOSPITAL_BASED_OUTPATIENT_CLINIC_OR_DEPARTMENT_OTHER): Payer: Self-pay | Admitting: Cardiovascular Disease

## 2024-03-01 MED ORDER — DOXYCYCLINE HYCLATE 100 MG PO TABS
100.0000 mg | ORAL_TABLET | Freq: Two times a day (BID) | ORAL | 0 refills | Status: AC
Start: 2024-03-01 — End: 2024-03-11

## 2024-03-01 NOTE — Telephone Encounter (Signed)
 The patient called in checking on status if antibiotics are going to be called in for her. She says she has been in contact with [facility] and that she cannot leave her husband as he has cancer and is very sick. The nasal spray and cough medicine is not helping. I teams [facility] and she is going to call the patient back.

## 2024-03-01 NOTE — Addendum Note (Signed)
 Addended by: JOLINDA NORENE HERO on: 03/01/2024 04:29 PM   Modules accepted: Orders

## 2024-03-02 NOTE — Telephone Encounter (Signed)
 Pt was talked to by provider

## 2024-03-10 ENCOUNTER — Telehealth: Admitting: Family Medicine

## 2024-04-12 ENCOUNTER — Telehealth: Admitting: Family Medicine

## 2024-04-12 ENCOUNTER — Encounter: Payer: Self-pay | Admitting: Family Medicine

## 2024-04-12 DIAGNOSIS — M5442 Lumbago with sciatica, left side: Secondary | ICD-10-CM | POA: Diagnosis not present

## 2024-04-12 DIAGNOSIS — G8929 Other chronic pain: Secondary | ICD-10-CM | POA: Diagnosis not present

## 2024-04-12 DIAGNOSIS — F419 Anxiety disorder, unspecified: Secondary | ICD-10-CM | POA: Diagnosis not present

## 2024-04-12 DIAGNOSIS — Z6379 Other stressful life events affecting family and household: Secondary | ICD-10-CM

## 2024-04-12 DIAGNOSIS — M5441 Lumbago with sciatica, right side: Secondary | ICD-10-CM | POA: Diagnosis not present

## 2024-04-12 MED ORDER — DULOXETINE HCL 30 MG PO CPEP: 30.0000 mg | ORAL_CAPSULE | Freq: Every day | ORAL | 3 refills | Status: DC

## 2024-04-12 MED ORDER — DULOXETINE HCL 60 MG PO CPEP: 60.0000 mg | ORAL_CAPSULE | Freq: Every day | ORAL | 3 refills | Status: DC

## 2024-04-12 MED ORDER — BUSPIRONE HCL 10 MG PO TABS: 10.0000 mg | ORAL_TABLET | Freq: Three times a day (TID) | ORAL | 2 refills | Status: DC

## 2024-04-12 MED ORDER — BUSPIRONE HCL 10 MG PO TABS: 10.0000 mg | ORAL_TABLET | Freq: Three times a day (TID) | ORAL | 3 refills | Status: DC

## 2024-04-12 NOTE — Progress Notes (Signed)
 MyChart Video visit  Subjective: CC:GAD/ Depression PCP: Jolinda Norene HERO, DO YEP:Regina Horton is a 73 y.o. female. Patient provides verbal consent for consult held via video.  Due to COVID-19 pandemic this visit was conducted virtually. This visit type was conducted due to national recommendations for restrictions regarding the COVID-19 Pandemic (e.g. social distancing, sheltering in place) in an effort to limit this patient's exposure and mitigate transmission in our community. All issues noted in this document were discussed and addressed.  A physical exam was not performed with this format.   Location of patient: home Location of provider: [facility] Others present for call: spouse  1. Back pain Used to get caudal injections with Dr Colon.  She was on pain medication but she weaned from it because she didn't want to get dependent on it.  She wants to try caudal injections again.  Doesn't want surgery so stopped seeing Dr Colon.  2. GAD/ depression She reports ongoing crying spells despite treatment with Cymbalta  60mg  and Buspar  7.5mg  TID.   ROS: Per HPI  Allergies  Allergen Reactions   Levocetirizine Hives and Itching   Xyzal  [Levocetirizine Dihydrochloride ] Hives and Itching   Codeine     Pt states she does not have any problems with codeine.   Nickel Rash   Past Medical History:  Diagnosis Date   Allergic rhinitis    Allergy Don't remember   Nickel ears pierced when I was 14    Sinus meds 2017   Anemia 1974 /2005   After having baby, again befor back surgery   Angina at rest [facility])    Anxiety    Aortic sclerosis    Arthritis Many years ago   Cataract 2016   CHF (congestive heart failure) (HCC) 2020   Dr Burnard found it.   Chronic back pain    Chronic constipation    CKD (chronic kidney disease)    COPD (chronic obstructive pulmonary disease) (HCC) 1958   Chronic bronchitis   Depression    Fibromyalgia    GERD (gastroesophageal reflux disease)     Hyperlipidemia    Hypertension    Hypertension    Mitral regurgitation    Osteoporosis    Palpitations    Thyroid  disease    Tricuspid regurgitation    Ulcer     Current Outpatient Medications:    atorvastatin  (LIPITOR) 40 MG tablet, Take 1 tablet (40 mg total) by mouth daily., Disp: 90 tablet, Rfl: 3   benzonatate  (TESSALON  PERLES) 100 MG capsule, Take 1 capsule (100 mg total) by mouth 3 (three) times daily as needed., Disp: 20 capsule, Rfl: 0   busPIRone  (BUSPAR ) 7.5 MG tablet, Take 1 tablet (7.5 mg total) by mouth 3 (three) times daily. For anxiety/ nerves, Disp: , Rfl:    Carboxymethylcellul-Glycerin (REFRESH RELIEVA OP), Apply 1 drop to eye. 6 times per day, Disp: , Rfl:    desloratadine  (CLARINEX ) 5 MG tablet, Take 1 tablet (5 mg total) by mouth every other day. For allergies, Disp: 45 tablet, Rfl: 3   diltiazem  (CARDIZEM  CD) 120 MG 24 hr capsule, Take 1 capsule (120 mg total) by mouth daily., Disp: 90 capsule, Rfl: 3   DULoxetine  (CYMBALTA ) 60 MG capsule, Take 1 capsule (60 mg total) by mouth daily., Disp: 90 capsule, Rfl: 3   fenofibrate  160 MG tablet, Take 1 tablet (160 mg total) by mouth daily., Disp: 90 tablet, Rfl: 3   fluticasone  (FLONASE ) 50 MCG/ACT nasal spray, Place 2 sprays into both nostrils daily., Disp:  48 g, Rfl: 1   furosemide  (LASIX ) 20 MG tablet, Take 2 tablets (40 mg total) by mouth every morning AND 1 tablet (20 mg total) every evening., Disp: 270 tablet, Rfl: 3   gabapentin  (NEURONTIN ) 300 MG capsule, Take 1 capsule (300 mg total) by mouth 2 (two) times daily., Disp: 180 capsule, Rfl: 3   levothyroxine  (SYNTHROID ) 75 MCG tablet, Take 1 tablet (75 mcg total) by mouth daily., Disp: 90 tablet, Rfl: 0   losartan  (COZAAR ) 25 MG tablet, Take 1/2 tablet (12.5 mg total) by mouth daily., Disp: 45 tablet, Rfl: 3   methocarbamol  (ROBAXIN ) 500 MG tablet, Take 1-2 tablets (500-1,000 mg total) by mouth every 6 (six) hours as needed for muscle spasms., Disp: 720 tablet, Rfl: 1    metoprolol  succinate (TOPROL -XL) 100 MG 24 hr tablet, Take 1 tablet (100 mg total) by mouth every morning AND 1/2 tablet (50 mg total) every evening. Take with or immediately following a meal., Disp: 135 tablet, Rfl: 3   omega-3 acid ethyl esters (LOVAZA ) 1 g capsule, Take 2 capsules (2 g total) by mouth 2 (two) times daily., Disp: 360 capsule, Rfl: 3   pantoprazole  (PROTONIX ) 40 MG tablet, Take 1 tablet (40 mg total) by mouth daily., Disp: 90 tablet, Rfl: 3  Current Facility-Administered Medications:    denosumab  (PROLIA ) injection 60 mg, 60 mg, Subcutaneous, Once, [name], DO  Gen: tearful Skin: dry skin, hair thinning Psych: mood depressed.  Assessment/ Plan: 73 y.o. female   Anxiety - Plan: DULoxetine  (CYMBALTA ) 30 MG capsule, DULoxetine  (CYMBALTA ) 60 MG capsule, busPIRone  (BUSPAR ) 10 MG tablet, DISCONTINUED: busPIRone  (BUSPAR ) 10 MG tablet  Stress due to illness of family member - Plan: DULoxetine  (CYMBALTA ) 30 MG capsule, DULoxetine  (CYMBALTA ) 60 MG capsule, busPIRone  (BUSPAR ) 10 MG tablet, DISCONTINUED: busPIRone  (BUSPAR ) 10 MG tablet  Chronic bilateral low back pain with sciatica, sciatica laterality unspecified - Plan: Ambulatory referral to Pain Clinic, DULoxetine  (CYMBALTA ) 30 MG capsule, DULoxetine  (CYMBALTA ) 60 MG capsule  Increased cymbalta  to 90mg  daily. Buspar  increased to 10mg  TID  Referral to PM&R for possible caudal injection therapy vs meds.  Start time: 2:29pm End time: 2:40pm  Total time spent on patient care (including video visit/ documentation): 13 minutes  Clarance Bollard CHRISTELLA Fielding, DO [facility] [phone]

## 2024-04-15 ENCOUNTER — Other Ambulatory Visit: Payer: Self-pay | Admitting: Family Medicine

## 2024-04-15 DIAGNOSIS — E039 Hypothyroidism, unspecified: Secondary | ICD-10-CM

## 2024-04-15 DIAGNOSIS — H6992 Unspecified Eustachian tube disorder, left ear: Secondary | ICD-10-CM

## 2024-04-16 NOTE — Telephone Encounter (Signed)
 Pt never came into have her labs drawn for her thyroid . No follow up appt scheduled.  Ok to refill?

## 2024-05-12 ENCOUNTER — Other Ambulatory Visit: Payer: Self-pay | Admitting: Family Medicine

## 2024-05-12 DIAGNOSIS — H6992 Unspecified Eustachian tube disorder, left ear: Secondary | ICD-10-CM

## 2024-05-12 DIAGNOSIS — E039 Hypothyroidism, unspecified: Secondary | ICD-10-CM

## 2024-05-13 ENCOUNTER — Encounter: Payer: Self-pay | Admitting: Family Medicine

## 2024-05-13 NOTE — Telephone Encounter (Signed)
 Lmtcb to schedule an apt for refills

## 2024-05-13 NOTE — Telephone Encounter (Signed)
 [name] pt NTBS 30-d given 04/16/24

## 2024-05-22 ENCOUNTER — Other Ambulatory Visit: Payer: Self-pay | Admitting: Family Medicine

## 2024-05-22 DIAGNOSIS — E039 Hypothyroidism, unspecified: Secondary | ICD-10-CM

## 2024-05-22 DIAGNOSIS — H6992 Unspecified Eustachian tube disorder, left ear: Secondary | ICD-10-CM

## 2024-05-28 ENCOUNTER — Other Ambulatory Visit: Payer: Self-pay | Admitting: Family Medicine

## 2024-05-28 DIAGNOSIS — K219 Gastro-esophageal reflux disease without esophagitis: Secondary | ICD-10-CM

## 2024-06-02 ENCOUNTER — Encounter (INDEPENDENT_AMBULATORY_CARE_PROVIDER_SITE_OTHER): Payer: Self-pay | Admitting: Gastroenterology

## 2024-06-05 ENCOUNTER — Other Ambulatory Visit: Payer: Self-pay | Admitting: Family Medicine

## 2024-06-05 DIAGNOSIS — E039 Hypothyroidism, unspecified: Secondary | ICD-10-CM

## 2024-06-16 ENCOUNTER — Ambulatory Visit: Admitting: Family Medicine

## 2024-06-16 ENCOUNTER — Ambulatory Visit (INDEPENDENT_AMBULATORY_CARE_PROVIDER_SITE_OTHER)

## 2024-06-16 ENCOUNTER — Other Ambulatory Visit: Payer: Self-pay | Admitting: Family Medicine

## 2024-06-16 ENCOUNTER — Encounter: Payer: Self-pay | Admitting: Family Medicine

## 2024-06-16 VITALS — BP 118/45 | HR 55 | Temp 97.6°F | Ht 64.0 in | Wt 179.2 lb

## 2024-06-16 DIAGNOSIS — M810 Age-related osteoporosis without current pathological fracture: Secondary | ICD-10-CM

## 2024-06-16 DIAGNOSIS — N184 Chronic kidney disease, stage 4 (severe): Secondary | ICD-10-CM

## 2024-06-16 DIAGNOSIS — M5442 Lumbago with sciatica, left side: Secondary | ICD-10-CM

## 2024-06-16 DIAGNOSIS — G8929 Other chronic pain: Secondary | ICD-10-CM

## 2024-06-16 DIAGNOSIS — Z79899 Other long term (current) drug therapy: Secondary | ICD-10-CM

## 2024-06-16 DIAGNOSIS — K219 Gastro-esophageal reflux disease without esophagitis: Secondary | ICD-10-CM

## 2024-06-16 DIAGNOSIS — E039 Hypothyroidism, unspecified: Secondary | ICD-10-CM

## 2024-06-16 DIAGNOSIS — W19XXXA Unspecified fall, initial encounter: Secondary | ICD-10-CM

## 2024-06-16 DIAGNOSIS — Z23 Encounter for immunization: Secondary | ICD-10-CM | POA: Diagnosis not present

## 2024-06-16 DIAGNOSIS — Z0000 Encounter for general adult medical examination without abnormal findings: Secondary | ICD-10-CM

## 2024-06-16 DIAGNOSIS — I10 Essential (primary) hypertension: Secondary | ICD-10-CM

## 2024-06-16 DIAGNOSIS — Z6379 Other stressful life events affecting family and household: Secondary | ICD-10-CM

## 2024-06-16 DIAGNOSIS — M25561 Pain in right knee: Secondary | ICD-10-CM | POA: Diagnosis not present

## 2024-06-16 DIAGNOSIS — Z0001 Encounter for general adult medical examination with abnormal findings: Secondary | ICD-10-CM

## 2024-06-16 DIAGNOSIS — E559 Vitamin D deficiency, unspecified: Secondary | ICD-10-CM

## 2024-06-16 DIAGNOSIS — E785 Hyperlipidemia, unspecified: Secondary | ICD-10-CM

## 2024-06-16 DIAGNOSIS — F419 Anxiety disorder, unspecified: Secondary | ICD-10-CM

## 2024-06-16 DIAGNOSIS — M5441 Lumbago with sciatica, right side: Secondary | ICD-10-CM | POA: Diagnosis not present

## 2024-06-16 MED ORDER — DULOXETINE HCL 60 MG PO CPEP: 60.0000 mg | ORAL_CAPSULE | Freq: Every day | ORAL | 3 refills | Status: AC

## 2024-06-16 MED ORDER — HYDROCODONE-ACETAMINOPHEN 5-325 MG PO TABS: 0.5000 | ORAL_TABLET | Freq: Two times a day (BID) | ORAL | 0 refills | Status: DC | PRN

## 2024-06-16 MED ORDER — PANTOPRAZOLE SODIUM 40 MG PO TBEC: 40.0000 mg | DELAYED_RELEASE_TABLET | Freq: Every day | ORAL | 3 refills | Status: AC

## 2024-06-16 MED ORDER — LEVOTHYROXINE SODIUM 75 MCG PO TABS: 75.0000 ug | ORAL_TABLET | Freq: Every day | ORAL | 3 refills | Status: AC

## 2024-06-16 MED ORDER — LOSARTAN POTASSIUM 25 MG PO TABS
12.5000 mg | ORAL_TABLET | Freq: Every day | ORAL | 3 refills | Status: AC
Start: 2024-06-16 — End: ?

## 2024-06-16 MED ORDER — DULOXETINE HCL 30 MG PO CPEP: 30.0000 mg | ORAL_CAPSULE | Freq: Every day | ORAL | 3 refills | Status: AC

## 2024-06-16 MED ORDER — ATORVASTATIN CALCIUM 40 MG PO TABS: 40.0000 mg | ORAL_TABLET | Freq: Every day | ORAL | 3 refills | Status: AC

## 2024-06-16 MED ORDER — GABAPENTIN 300 MG PO CAPS: 300.0000 mg | ORAL_CAPSULE | Freq: Two times a day (BID) | ORAL | 3 refills | Status: AC

## 2024-06-16 MED ORDER — BUSPIRONE HCL 15 MG PO TABS: 15.0000 mg | ORAL_TABLET | Freq: Three times a day (TID) | ORAL | 3 refills | Status: DC

## 2024-06-16 NOTE — Progress Notes (Signed)
 Regina Horton is a 73 y.o. female presents to office today for annual physical exam examination.    She is accompanied today by her husband.  She reports that she had a fall about 2 months ago and injured her right knee and exacerbated her underlying low back pain.  She has known degenerative disc disease and DJD that was managed by Dr. Colon in [place] and by Dr. Milton previously.  Was previously on opioid medications but was ultimately weaned from this medication as there was concern for ongoing, long-term use.  However, she also goes on to state that her pain has been refractory to injection therapy and she is not a good surgical candidate due to her renal and heart disease.  She has been taking OTC medications, which include Motrin, and he has been has tried smoking marijuana and attempt to control pain but nothing is really helping at this point.  She is on gabapentin  as well.  Denies any excessive daytime sedation.  She has ongoing anxiety and depression despite Cymbalta  90 mg, BuSpar  10 mg 3 times daily.  Her husband has failing health currently and this worries her because she cannot imagine a life without him.  She is utilizing a walker at home.  Occupation: retired, Marital status: married, Substance use: THC <1 month ago Health Maintenance Due  Topic Date Due   Zoster Vaccines- Shingrix (1 of 2) 03/15/1999   Colonoscopy  09/19/2013   DEXA SCAN  11/20/2019   Influenza Vaccine  03/19/2024   COVID-19 Vaccine (6 - 2025-26 season) 04/19/2024    Immunization History  Administered Date(s) Administered   INFLUENZA, HIGH DOSE SEASONAL PF 07/17/2017, 08/15/2017, 06/22/2018   Influenza Whole 05/02/2010   Influenza,inj,Quad PF,6+ Mos 07/07/2014, 05/16/2015, 06/12/2016   Influenza-Unspecified 07/18/2017, 07/01/2018, 06/23/2020, 06/20/2021, 08/01/2022   Moderna Sars-Covid-2 Vaccination 10/01/2019, 10/29/2019   PFIZER(Purple Top)SARS-COV-2 Vaccination 06/22/2020, 11/20/2020,  06/20/2021   Pneumococcal Conjugate-13 01/09/2015   Pneumococcal Polysaccharide-23 08/19/1998, 02/07/2011, 06/12/2016   Respiratory Syncytial Virus Vaccine,Recomb Aduvanted(Arexvy) 08/01/2022   Td 08/07/2010   Tdap 08/01/2022   Zoster, Live 02/15/2011   Past Medical History:  Diagnosis Date   Allergic rhinitis    Allergy Don't remember   Nickel ears pierced when I was 14    Sinus meds 2017   Anemia 1974 /2005   After having baby, again befor back surgery   Angina at rest    Anxiety    Aortic sclerosis    Arthritis Many years ago   Cataract 2016   CHF (congestive heart failure) (HCC) 2020   Dr Burnard found it.   Chronic back pain    Chronic constipation    CKD (chronic kidney disease)    COPD (chronic obstructive pulmonary disease) (HCC) 1958   Chronic bronchitis   Depression    Fibromyalgia    GERD (gastroesophageal reflux disease)    Hyperlipidemia    Hypertension    Hypertension    Mitral regurgitation    Osteoporosis    Palpitations    Thyroid  disease    Tricuspid regurgitation    Ulcer    Social History   Socioeconomic History   Marital status: Married    Spouse name: Chaney   Number of children: 2   Years of education: 12   Highest education level: Some college, no degree  Occupational History   Occupation: disabled   Tobacco Use   Smoking status: Former    Current packs/day: 0.00    Average packs/day: 1 pack/day for 21.7  years (22.6 ttl pk-yrs)    Types: Cigarettes    Start date: 12/24/1983    Quit date: 12/24/2003    Years since quitting: 20.4   Smokeless tobacco: Never   Tobacco comments:    Stopped on May 07,2005 before back surgery. Haven't smoked sence.  Vaping Use   Vaping status: Never Used  Substance and Sexual Activity   Alcohol use: Not Currently   Drug use: Not Currently    Types: Marijuana    Comment: boils marijuana in water and makes a tea about 3 times a week   Sexual activity: Not Currently    Birth control/protection:  Post-menopausal, Surgical, Other-see comments    Comment: Tubes tied 1973  Other Topics Concern   Not on file  Social History Narrative   Not on file   Social Drivers of Health   Financial Resource Strain: High Risk (06/14/2024)   Overall Financial Resource Strain (CARDIA)    Difficulty of Paying Living Expenses: Hard  Food Insecurity: Food Insecurity Present (06/14/2024)   Hunger Vital Sign    Worried About Running Out of Food in the Last Year: Sometimes true    Ran Out of Food in the Last Year: Sometimes true  Transportation Needs: Unmet Transportation Needs (06/14/2024)   PRAPARE - Transportation    Lack of Transportation (Medical): Yes    Lack of Transportation (Non-Medical): Yes  Physical Activity: Inactive (06/14/2024)   Exercise Vital Sign    Days of Exercise per Week: 0 days    Minutes of Exercise per Session: Not on file  Stress: Stress Concern Present (06/14/2024)   [employer] of Occupational Health - Occupational Stress Questionnaire    Feeling of Stress: Very much  Social Connections: Socially Isolated (06/14/2024)   Social Connection and Isolation Panel    Frequency of Communication with Friends and Family: Once a week    Frequency of Social Gatherings with Friends and Family: Once a week    Attends Religious Services: Never    Database Administrator or Organizations: No    Attends Engineer, Structural: Not on file    Marital Status: Married  Catering Manager Violence: Not At Risk (10/21/2023)   Humiliation, Afraid, Rape, and Kick questionnaire    Fear of Current or Ex-Partner: No    Emotionally Abused: No    Physically Abused: No    Sexually Abused: No   Past Surgical History:  Procedure Laterality Date   BACK SURGERY  08/20/2003   BREAST LUMPECTOMY  08/19/1968   right    CATARACT EXTRACTION W/PHACO Right 06/15/2015   Procedure: CATARACT EXTRACTION PHACO AND INTRAOCULAR LENS PLACEMENT (IOC);  Surgeon: Cherene Mania, MD;  Location: [facility];   Service: Ophthalmology;  Laterality: Right;  CDE: 10.33   CATARACT EXTRACTION W/PHACO Left 07/17/2015   Procedure: CATARACT EXTRACTION PHACO AND INTRAOCULAR LENS PLACEMENT (IOC);  Surgeon: Cherene Mania, MD;  Location: [facility];  Service: Ophthalmology;  Laterality: Left;  CDE:7.60   EYE SURGERY  2018   Cataracts   FRACTURE SURGERY  2016   Left ankle.   NM MYOCAR PERF WALL MOTION  02/16/2009   normal   ORIF ANKLE FRACTURE Left 10/26/2014   Procedure: OPEN REDUCTION INTERNAL FIXATION (ORIF) ANKLE FRACTURE;  Surgeon: Lemond Stable, MD;  Location: [facility];  Service: Orthopedics;  Laterality: Left;   SPINE SURGERY  2005   Fusion with hardware   TONSILLECTOMY  childhood    TUBAL LIGATION  08/20/1971   US  ECHOCARDIOGRAPHY  12/19/2008  mild mitral annular ca+,AOV mildly sclerotic,mild MR & TR   Family History  Problem Relation Age of Onset   Rheum arthritis Mother    Osteoporosis Mother    Arthritis Mother    Depression Mother    Varicose Veins Mother    Heart disease Father    Diabetes Father    Hyperlipidemia Father    Bladder Cancer Father    Cancer Father    Depression Father    Hypertension Father    Obesity Father    Heart disease Brother    Arthritis Brother    Alcohol abuse Brother    Drug abuse Brother    Depression Maternal Grandmother    Arthritis Maternal Grandmother    Cancer Maternal Grandmother    Diabetes Maternal Grandmother    Depression Maternal Grandfather    Early death Maternal Grandfather    Depression Paternal Grandfather    Alcohol abuse Brother    Arthritis Brother    Drug abuse Brother    Arthritis Maternal Uncle     Current Outpatient Medications:    atorvastatin  (LIPITOR) 40 MG tablet, Take 1 tablet (40 mg total) by mouth daily., Disp: 90 tablet, Rfl: 3   busPIRone  (BUSPAR ) 10 MG tablet, Take 1 tablet (10 mg total) by mouth 3 (three) times daily. For anxiety/ nerves, Disp: 90 tablet, Rfl: 3   Carboxymethylcellul-Glycerin (REFRESH RELIEVA OP),  Apply 1 drop to eye. 6 times per day, Disp: , Rfl:    desloratadine  (CLARINEX ) 5 MG tablet, Take 1 tablet (5 mg total) by mouth every other day. For allergies, Disp: 30 tablet, Rfl: 0   diltiazem  (CARDIZEM  CD) 120 MG 24 hr capsule, Take 1 capsule (120 mg total) by mouth daily., Disp: 90 capsule, Rfl: 3   DULoxetine  (CYMBALTA ) 30 MG capsule, Take 1 capsule (30 mg total) by mouth daily. Take with the 60mg  to equal 90mg  daily., Disp: 90 capsule, Rfl: 3   DULoxetine  (CYMBALTA ) 60 MG capsule, Take 1 capsule (60 mg total) by mouth daily. Take with 30mg  to equal 90mg , Disp: 90 capsule, Rfl: 3   fenofibrate  160 MG tablet, Take 1 tablet (160 mg total) by mouth daily., Disp: 90 tablet, Rfl: 3   fluticasone  (FLONASE ) 50 MCG/ACT nasal spray, Place 2 sprays into both nostrils daily., Disp: 48 g, Rfl: 1   furosemide  (LASIX ) 20 MG tablet, Take 2 tablets (40 mg total) by mouth every morning AND 1 tablet (20 mg total) every evening., Disp: 270 tablet, Rfl: 3   gabapentin  (NEURONTIN ) 300 MG capsule, Take 1 capsule (300 mg total) by mouth 2 (two) times daily., Disp: 180 capsule, Rfl: 3   levothyroxine  (SYNTHROID ) 75 MCG tablet, Take 1 tablet (75 mcg total) by mouth daily before breakfast., Disp: 30 tablet, Rfl: 0   losartan  (COZAAR ) 25 MG tablet, Take 1/2 tablet (12.5 mg total) by mouth daily., Disp: 45 tablet, Rfl: 3   methocarbamol  (ROBAXIN ) 500 MG tablet, Take 1-2 tablets (500-1,000 mg total) by mouth every 6 (six) hours as needed for muscle spasms., Disp: 720 tablet, Rfl: 1   metoprolol  succinate (TOPROL -XL) 100 MG 24 hr tablet, Take 1 tablet (100 mg total) by mouth every morning AND 1/2 tablet (50 mg total) every evening. Take with or immediately following a meal., Disp: 135 tablet, Rfl: 3   omega-3 acid ethyl esters (LOVAZA ) 1 g capsule, Take 2 capsules (2 g total) by mouth 2 (two) times daily., Disp: 360 capsule, Rfl: 3   pantoprazole  (PROTONIX ) 40 MG tablet, Take 1  tablet by mouth once daily, Disp: 90 tablet,  Rfl: 0  Current Facility-Administered Medications:    denosumab  (PROLIA ) injection 60 mg, 60 mg, Subcutaneous, Once, Bhargav Barbaro M, DO  Allergies  Allergen Reactions   Levocetirizine Hives and Itching   Xyzal  [Levocetirizine Dihydrochloride ] Hives and Itching   Codeine     Pt states she does not have any problems with codeine.   Nickel Rash     ROS: Review of Systems Pertinent items noted in HPI and remainder of comprehensive ROS otherwise negative.    Physical exam BP (!) 118/45   Pulse (!) 55   Temp 97.6 F (36.4 C)   Ht 5' 4 (1.626 m)   Wt 179 lb 4 oz (81.3 kg)   SpO2 92%   BMI 30.77 kg/m  General appearance: alert, cooperative, appears stated age, and mild distress Head: Normocephalic, without obvious abnormality, atraumatic Eyes: negative findings: lids and lashes normal, conjunctivae and sclerae normal, corneas clear, and pupils equal, round, reactive to light and accomodation Ears: normal TM's and external ear canals both ears Nose: Nares normal. Septum midline. Mucosa normal. No drainage or sinus tenderness. Throat: lips, mucosa, and tongue normal; teeth and gums normal Neck: no adenopathy, no carotid bruit, supple, symmetrical, trachea midline, and thyroid  not enlarged, symmetric, no tenderness/mass/nodules Back: Increased thoracic kyphosis.  Arrives in wheelchair.  No midline tenderness palpation to the spine.  Limited extension of the C-spine Lungs: clear to auscultation bilaterally Heart: regular rate and rhythm, S1, S2 normal, no murmur, click, rub or gallop Abdomen: soft, non-tender; bowel sounds normal; no masses,  no organomegaly Extremities: She has some mild joint effusion noted along the right knee with osteoarthritic changes present.  No gross erythema, warmth or soft tissue swelling appreciated. Pulses: 2+ and symmetric Skin: Skin color, texture, turgor normal. No rashes or lesions Lymph nodes: No supraclavicular or anterior cervical lymph node  enlargement Neurologic: Grossly normal.  Wears glasses Psych: Mood depressed, tearful     10/21/2023    5:25 PM 04/15/2023    2:19 PM 10/02/2022    3:38 PM  Depression screen PHQ 2/9  Decreased Interest 1 1 0  Down, Depressed, Hopeless 1 1 0  PHQ - 2 Score 2 2 0  Altered sleeping 1 1   Tired, decreased energy 0 0   Change in appetite 0 0   Feeling bad or failure about yourself  1 1   Trouble concentrating 0 0   Moving slowly or fidgety/restless 0 0   Suicidal thoughts 0 0   PHQ-9 Score 4 4   Difficult doing work/chores  Somewhat difficult       04/15/2023    2:20 PM 08/14/2021    4:10 PM 02/08/2021    4:52 PM 02/08/2021    4:35 PM  GAD 7 : Generalized Anxiety Score  Nervous, Anxious, on Edge 1 0 0 1  Control/stop worrying 1 0  0  Worry too much - different things 1 0  0  Trouble relaxing 2 0  0  Restless 1 0  0  Easily annoyed or irritable 1 0  1  Afraid - awful might happen 1 0  0  Total GAD 7 Score 8 0  2  Anxiety Difficulty Somewhat difficult Not difficult at all  Not difficult at all    No results found for this or any previous visit (from the past 2160 hours).   Assessment/ Plan: Erminio JULIANNA Plenty here for annual physical exam.   Annual physical  exam  Fall, initial encounter - Plan: DG Knee 1-2 Views Right, DG Lumbar Spine Complete, ToxASSURE Select 13 (MW), Urine  Acute pain of right knee - Plan: DG Knee 1-2 Views Right, ToxASSURE Select 13 (MW), Urine  Controlled substance agreement signed - Plan: ToxASSURE Select 13 (MW), Urine  Chronic bilateral low back pain with sciatica, sciatica laterality unspecified - Plan: DG Lumbar Spine Complete, ToxASSURE Select 13 (MW), Urine, HYDROcodone -acetaminophen  (NORCO/VICODIN) 5-325 MG tablet, DULoxetine  (CYMBALTA ) 30 MG capsule, DULoxetine  (CYMBALTA ) 60 MG capsule, gabapentin  (NEURONTIN ) 300 MG capsule  Anxiety - Plan: busPIRone  (BUSPAR ) 15 MG tablet, DULoxetine  (CYMBALTA ) 30 MG capsule, DULoxetine  (CYMBALTA ) 60 MG  capsule  Stress due to illness of family member - Plan: busPIRone  (BUSPAR ) 15 MG tablet, DULoxetine  (CYMBALTA ) 30 MG capsule, DULoxetine  (CYMBALTA ) 60 MG capsule  Stage 4 chronic kidney disease (HCC) - Plan: CMP14+EGFR, CBC with Differential, DG [facility] DEXA  Age-related osteoporosis without current pathological fracture - Plan: CMP14+EGFR, DG [facility] DEXA  Morbid obesity (HCC) - Plan: CMP14+EGFR  Acquired hypothyroidism - Plan: CMP14+EGFR, TSH + free T4, levothyroxine  (SYNTHROID ) 75 MCG tablet  Essential hypertension - Plan: CMP14+EGFR  Vitamin D  deficiency - Plan: CMP14+EGFR, VITAMIN D  25 Hydroxy (Vit-D Deficiency, Fractures), DG [facility] DEXA  Hyperlipidemia LDL goal <100 - Plan: CMP14+EGFR, Lipid Panel  Gastroesophageal reflux disease without esophagitis - Plan: pantoprazole  (PROTONIX ) 40 MG tablet   Films obtained of that right knee given fall and persistent tenderness and swelling.  I personally viewed the x-rays and did not appreciate anything that looks like a fracture but this far out it may be healed so awaiting formal review by radiology.  I suspect at this point more likely soft tissue injury, possibly prepatellar bursitis.  Continue icing the affected area.  Were adding Norco for chronic pain anyways for her low back that is uncontrolled by home analgesics.  She was previously prescribed a high dose opioids, Percocet but that was discontinued after her specialist left.  She is only maintained on gabapentin  at this point.  She is not a great surgical candidate for her C-spine or lumbar spine though has persistent issues despite history of injection therapy.  May need to consider reevaluation with Dr. Colon if symptoms are progressive and/or refractory to current management.  We discussed the risks of opioid treatment.  We discussed bowel regimen, need for minimal use as needed.  UDS and CSA were updated as per office policy and the national narcotic database was reviewed.  Will plan for very  close follow-up to see how she is doing on this medication and sure that she is not having any adverse side effects or events  Her anxiety is not well-controlled and I suspect this is again situational due to her husband's failing health.  I suspect that the uncontrolled pain likely is compounding it.  However, we will proceed with increased dose of BuSpar  15 mg 3 times daily.  She is on max dose Cymbalta .  Benzodiazepines not appropriate given concomitant use of opioid  Will check nonfasting labs today including thyroid  levels.  Depressed but otherwise clinically euthymic.  Blood pressure is controlled.  No changes are needed  Will defer DEXA scan to a future visit given pain today  Influenza and shingles shot administered today  Counseled on healthy lifestyle choices, including diet (rich in fruits, vegetables and lean meats and low in salt and simple carbohydrates) and exercise (at least 30 minutes of moderate physical activity daily).  Patient to follow up 53m  Shetara Launer M.  Jolinda, DO

## 2024-06-16 NOTE — Patient Instructions (Signed)
Controlled Substance Guidelines:  1. You cannot get an early refill, even it is lost.  2. You cannot get controlled medications from any other doctor, unless it is the emergency department and related to a new problem or injury.  3. You cannot use alcohol, marijuana, cocaine or any other recreational drugs while using this medication. This is very dangerous.  4. You are willing to have your urine drug tested at each visit.  5. You will not drive while using this medication, because that can put yourself and others in serious danger of an accident. 6. If any medication is stolen, then there must be a police report to verify it, or it cannot be refilled.  7. I will not prescribe these medications for longer than 3 months.  8. You must bring your pill bottle to each visit.  9. You must use the same pharmacy for all refills for the medication, unless you clear it with me beforehand.  10. You cannot share or sell this medication.   

## 2024-06-17 LAB — CMP14+EGFR
ALT: 14 IU/L (ref 0–32)
AST: 30 IU/L (ref 0–40)
Albumin: 3.9 g/dL (ref 3.8–4.8)
Alkaline Phosphatase: 77 IU/L (ref 49–135)
BUN/Creatinine Ratio: 18 (ref 12–28)
BUN: 44 mg/dL — ABNORMAL HIGH (ref 8–27)
Bilirubin Total: 0.5 mg/dL (ref 0.0–1.2)
CO2: 28 mmol/L (ref 20–29)
Calcium: 10.2 mg/dL (ref 8.7–10.3)
Chloride: 98 mmol/L (ref 96–106)
Creatinine, Ser: 2.49 mg/dL — ABNORMAL HIGH (ref 0.57–1.00)
Globulin, Total: 2.2 g/dL (ref 1.5–4.5)
Glucose: 90 mg/dL (ref 70–99)
Potassium: 4.3 mmol/L (ref 3.5–5.2)
Sodium: 141 mmol/L (ref 134–144)
Total Protein: 6.1 g/dL (ref 6.0–8.5)
eGFR: 20 mL/min/1.73 — ABNORMAL LOW (ref >59)

## 2024-06-17 LAB — LIPID PANEL
Chol/HDL Ratio: 3 ratio (ref 0.0–4.4)
Cholesterol, Total: 133 mg/dL (ref 100–199)
HDL: 45 mg/dL (ref >39)
LDL Chol Calc (NIH): 59 mg/dL (ref 0–99)
Triglycerides: 173 mg/dL — ABNORMAL HIGH (ref 0–149)
VLDL Cholesterol Cal: 29 mg/dL (ref 5–40)

## 2024-06-17 LAB — CBC WITH DIFFERENTIAL/PLATELET
Basophils Absolute: 0 x10E3/uL (ref 0.0–0.2)
Basos: 1 %
EOS (ABSOLUTE): 0.1 x10E3/uL (ref 0.0–0.4)
Eos: 1 %
Hematocrit: 37.3 % (ref 34.0–46.6)
Hemoglobin: 11.8 g/dL (ref 11.1–15.9)
Immature Grans (Abs): 0 x10E3/uL (ref 0.0–0.1)
Immature Granulocytes: 0 %
Lymphocytes Absolute: 2.8 x10E3/uL (ref 0.7–3.1)
Lymphs: 44 %
MCH: 30.7 pg (ref 26.6–33.0)
MCHC: 31.6 g/dL (ref 31.5–35.7)
MCV: 97 fL (ref 79–97)
Monocytes Absolute: 0.6 x10E3/uL (ref 0.1–0.9)
Monocytes: 10 %
Neutrophils Absolute: 2.7 x10E3/uL (ref 1.4–7.0)
Neutrophils: 44 %
Platelets: 376 x10E3/uL (ref 150–450)
RBC: 3.84 x10E6/uL (ref 3.77–5.28)
RDW: 12.6 % (ref 11.7–15.4)
WBC: 6.2 x10E3/uL (ref 3.4–10.8)

## 2024-06-17 LAB — TSH+FREE T4
Free T4: 1.71 ng/dL (ref 0.82–1.77)
TSH: 1.68 u[IU]/mL (ref 0.450–4.500)

## 2024-06-17 LAB — VITAMIN D 25 HYDROXY (VIT D DEFICIENCY, FRACTURES): Vit D, 25-Hydroxy: 34.7 ng/mL (ref 30.0–100.0)

## 2024-06-18 ENCOUNTER — Ambulatory Visit: Payer: Self-pay | Admitting: Family Medicine

## 2024-06-20 LAB — TOXASSURE SELECT 13 (MW), URINE

## 2024-06-21 ENCOUNTER — Encounter: Payer: Self-pay | Admitting: Family Medicine

## 2024-06-21 MED ORDER — HYDROCODONE-ACETAMINOPHEN 5-325 MG PO TABS: 1.0000 | ORAL_TABLET | Freq: Four times a day (QID) | ORAL | 0 refills | Status: DC | PRN

## 2024-07-11 ENCOUNTER — Other Ambulatory Visit: Payer: Self-pay | Admitting: Family Medicine

## 2024-07-11 DIAGNOSIS — H6992 Unspecified Eustachian tube disorder, left ear: Secondary | ICD-10-CM

## 2024-07-14 ENCOUNTER — Encounter: Payer: Self-pay | Admitting: Family Medicine

## 2024-07-14 ENCOUNTER — Telehealth (INDEPENDENT_AMBULATORY_CARE_PROVIDER_SITE_OTHER): Admitting: Family Medicine

## 2024-07-14 DIAGNOSIS — Z6379 Other stressful life events affecting family and household: Secondary | ICD-10-CM

## 2024-07-14 DIAGNOSIS — F419 Anxiety disorder, unspecified: Secondary | ICD-10-CM

## 2024-07-14 DIAGNOSIS — G8929 Other chronic pain: Secondary | ICD-10-CM

## 2024-07-14 DIAGNOSIS — M25561 Pain in right knee: Secondary | ICD-10-CM

## 2024-07-14 DIAGNOSIS — M5441 Lumbago with sciatica, right side: Secondary | ICD-10-CM

## 2024-07-14 DIAGNOSIS — M5442 Lumbago with sciatica, left side: Secondary | ICD-10-CM

## 2024-07-14 MED ORDER — HYDROXYZINE HCL 25 MG PO TABS: ORAL_TABLET | ORAL | 1 refills | Status: AC

## 2024-07-14 NOTE — Progress Notes (Signed)
 MyChart Video visit  Subjective: CC:f.u pain/ GAD PCP: Jolinda Norene HERO, DO YEP:Amzwij Regina Horton is a 73 y.o. female. Patient provides verbal consent for consult held via video.  Due to COVID-19 pandemic this visit was conducted virtually. This visit type was conducted due to national recommendations for restrictions regarding the COVID-19 Pandemic (e.g. social distancing, sheltering in place) in an effort to limit this patient's exposure and mitigate transmission in our community. All issues noted in this document were discussed and addressed.  A physical exam was not performed with this format.   Location of patient: home Location of provider: [facility] Others present for call: none  1. GAD/ pain Right knee getting better.  Low back pain getting better. She reports improvement in her nerves.  Found some old atarax  rx'd by Dr Milton a few years ago and that seems to work really well so replaced the Buspar  with this.  Not having any dryness/ or excessive daytime sedation.  She discontinued the Norco because it was causing constipation that was not relieved by Senokot or hydration.  She felt it minimally improved pain so she has been relying on ibuprofen and her muscle relaxant as needed.  ROS: Per HPI  Allergies  Allergen Reactions   Levocetirizine Hives and Itching   Xyzal  [Levocetirizine Dihydrochloride ] Hives and Itching   Codeine     Pt states she does not have any problems with codeine.   Nickel Rash   Past Medical History:  Diagnosis Date   Allergic rhinitis    Allergy Don't remember   Nickel ears pierced when I was 14    Sinus meds 2017   Anemia 1974 /2005   After having baby, again befor back surgery   Angina at rest    Anxiety    Aortic sclerosis    Arthritis Many years ago   Cataract 2016   CHF (congestive heart failure) (HCC) 2020   Dr Burnard found it.   Chronic back pain    Chronic constipation    CKD (chronic kidney disease)    COPD (chronic obstructive  pulmonary disease) (HCC) 1958   Chronic bronchitis   Depression    Fibromyalgia    GERD (gastroesophageal reflux disease)    Hyperlipidemia    Hypertension    Hypertension    Mitral regurgitation    Osteoporosis    Palpitations    Thyroid  disease    Tricuspid regurgitation    Ulcer     Current Outpatient Medications:    atorvastatin  (LIPITOR) 40 MG tablet, Take 1 tablet (40 mg total) by mouth daily., Disp: 100 tablet, Rfl: 3   busPIRone  (BUSPAR ) 15 MG tablet, Take 1 tablet (15 mg total) by mouth 3 (three) times daily. For anxiety/ nerves **dose change, Disp: 90 tablet, Rfl: 3   Carboxymethylcellul-Glycerin (REFRESH RELIEVA OP), Apply 1 drop to eye. 6 times per day, Disp: , Rfl:    desloratadine  (CLARINEX ) 5 MG tablet, TAKE 1 TABLET BY MOUTH EVERY OTHER DAY FOR ALLERGIES, Disp: 90 tablet, Rfl: 1   diltiazem  (CARDIZEM  CD) 120 MG 24 hr capsule, Take 1 capsule (120 mg total) by mouth daily., Disp: 90 capsule, Rfl: 3   DULoxetine  (CYMBALTA ) 30 MG capsule, Take 1 capsule (30 mg total) by mouth daily. Take with the 60mg  to equal 90mg  daily., Disp: 100 capsule, Rfl: 3   DULoxetine  (CYMBALTA ) 60 MG capsule, Take 1 capsule (60 mg total) by mouth daily. Take with 30mg  to equal 90mg , Disp: 100 capsule, Rfl: 3   fenofibrate   160 MG tablet, Take 1 tablet (160 mg total) by mouth daily., Disp: 90 tablet, Rfl: 3   fluticasone  (FLONASE ) 50 MCG/ACT nasal spray, Place 2 sprays into both nostrils daily., Disp: 48 g, Rfl: 1   furosemide  (LASIX ) 20 MG tablet, Take 2 tablets (40 mg total) by mouth every morning AND 1 tablet (20 mg total) every evening., Disp: 270 tablet, Rfl: 3   gabapentin  (NEURONTIN ) 300 MG capsule, Take 1 capsule (300 mg total) by mouth 2 (two) times daily., Disp: 200 capsule, Rfl: 3   HYDROcodone -acetaminophen  (NORCO/VICODIN) 5-325 MG tablet, Take 1 tablet by mouth every 6 (six) hours as needed for moderate pain (pain score 4-6)., Disp: 50 tablet, Rfl: 0   levothyroxine  (SYNTHROID ) 75 MCG  tablet, Take 1 tablet (75 mcg total) by mouth daily before breakfast., Disp: 100 tablet, Rfl: 3   losartan  (COZAAR ) 25 MG tablet, Take 1/2 tablet (12.5 mg total) by mouth daily., Disp: 45 tablet, Rfl: 3   methocarbamol  (ROBAXIN ) 500 MG tablet, Take 1-2 tablets (500-1,000 mg total) by mouth every 6 (six) hours as needed for muscle spasms., Disp: 720 tablet, Rfl: 1   metoprolol  succinate (TOPROL -XL) 100 MG 24 hr tablet, Take 1 tablet (100 mg total) by mouth every morning AND 1/2 tablet (50 mg total) every evening. Take with or immediately following a meal., Disp: 135 tablet, Rfl: 3   omega-3 acid ethyl esters (LOVAZA ) 1 g capsule, Take 2 capsules (2 g total) by mouth 2 (two) times daily., Disp: 360 capsule, Rfl: 3   pantoprazole  (PROTONIX ) 40 MG tablet, Take 1 tablet (40 mg total) by mouth daily., Disp: 100 tablet, Rfl: 3  Current Facility-Administered Medications:    denosumab  (PROLIA ) injection 60 mg, 60 mg, Subcutaneous, Once, Vivien Barretto M, DO  Gen: nontoxic, happy appearing female, NAD  Assessment/ Plan: 73 y.o. female   Anxiety - Plan: hydrOXYzine  (ATARAX ) 25 MG tablet  Stress due to illness of family member  Acute pain of right knee  Chronic bilateral low back pain with sciatica, sciatica laterality unspecified  He was not controlled with BuSpar  but reporting better control with Atarax  with no significant side effects.  Given CKD 4, okay to continue this.  Caution sedation.  Encourage p.o. hydration.  Continue Cymbalta  as directed.  BuSpar  discontinued  Pain improving.  Back pain stable without Norco.  Norco was causing too much constipation so she discontinued.  We discussed use of scheduled Tylenol .  She had been using ibuprofen but I advised against that given advanced renal disease.  She voiced good understanding and we will plan for 29-month follow-up, sooner if concerns arise  Start time: 11:21am End time: 11:34a  Total time spent on patient care (including video visit/  documentation): 15 minutes  Seann Genther CHRISTELLA Fielding, DO [facility] [phone]

## 2024-08-18 ENCOUNTER — Other Ambulatory Visit: Payer: Self-pay | Admitting: Family Medicine

## 2024-08-18 DIAGNOSIS — J069 Acute upper respiratory infection, unspecified: Secondary | ICD-10-CM

## 2024-09-03 ENCOUNTER — Other Ambulatory Visit: Payer: Self-pay | Admitting: Family Medicine

## 2024-09-03 DIAGNOSIS — M5441 Lumbago with sciatica, right side: Secondary | ICD-10-CM

## 2024-09-03 MED ORDER — METHOCARBAMOL 500 MG PO TABS: 500.0000 mg | ORAL_TABLET | Freq: Four times a day (QID) | ORAL | 1 refills | Status: AC | PRN

## 2024-09-03 NOTE — Addendum Note (Signed)
 Addended by: INA RAMP D on: 09/03/2024 04:30 PM   Modules accepted: Orders

## 2024-09-03 NOTE — Telephone Encounter (Signed)
 Please contact patient to verify that she is actually asking for this refill and it is not just an auto fill from her pharmacy.  Her previous prescription lasted her almost 2 years so I find it unusual that she would be requesting a refill already

## 2024-09-03 NOTE — Telephone Encounter (Signed)
 Yes pt is requesting refill, she does not taking them everyday just when she is having back pain which she is having now and is about to run out.

## 2024-10-21 ENCOUNTER — Ambulatory Visit: Payer: Self-pay

## 2025-01-12 ENCOUNTER — Ambulatory Visit: Admitting: Family Medicine

## 2025-01-12 ENCOUNTER — Other Ambulatory Visit
